# Patient Record
Sex: Female | Born: 1935 | Race: White | Hispanic: No | Smoking: Never smoker
Health system: Southern US, Community
[De-identification: ages and names within clinical notes are randomized; demographics above are authoritative.]

## PROBLEM LIST (undated history)

## (undated) DIAGNOSIS — C801 Malignant (primary) neoplasm, unspecified: Secondary | ICD-10-CM

## (undated) DIAGNOSIS — F0390 Unspecified dementia without behavioral disturbance: Secondary | ICD-10-CM

## (undated) DIAGNOSIS — E039 Hypothyroidism, unspecified: Secondary | ICD-10-CM

## (undated) DIAGNOSIS — I517 Cardiomegaly: Secondary | ICD-10-CM

## (undated) DIAGNOSIS — E079 Disorder of thyroid, unspecified: Secondary | ICD-10-CM

## (undated) DIAGNOSIS — I5033 Acute on chronic diastolic (congestive) heart failure: Secondary | ICD-10-CM

## (undated) DIAGNOSIS — S72009A Fracture of unspecified part of neck of unspecified femur, initial encounter for closed fracture: Secondary | ICD-10-CM

## (undated) DIAGNOSIS — G309 Alzheimer's disease, unspecified: Secondary | ICD-10-CM

## (undated) DIAGNOSIS — N184 Chronic kidney disease, stage 4 (severe): Secondary | ICD-10-CM

## (undated) DIAGNOSIS — F0280 Dementia in other diseases classified elsewhere without behavioral disturbance: Secondary | ICD-10-CM

## (undated) DIAGNOSIS — I10 Essential (primary) hypertension: Secondary | ICD-10-CM

## (undated) HISTORY — PX: MASTECTOMY: SHX3

---

## 2010-03-13 ENCOUNTER — Emergency Department (HOSPITAL_COMMUNITY): Admission: EM | Admit: 2010-03-13 | Discharge: 2010-03-13 | Payer: Self-pay | Admitting: Emergency Medicine

## 2010-05-25 ENCOUNTER — Emergency Department (HOSPITAL_COMMUNITY): Admission: EM | Admit: 2010-05-25 | Discharge: 2010-05-25 | Payer: Self-pay | Admitting: Emergency Medicine

## 2010-06-03 ENCOUNTER — Emergency Department (HOSPITAL_COMMUNITY)
Admission: EM | Admit: 2010-06-03 | Discharge: 2010-06-03 | Payer: Self-pay | Source: Home / Self Care | Admitting: Emergency Medicine

## 2010-06-27 ENCOUNTER — Emergency Department (HOSPITAL_COMMUNITY)
Admission: EM | Admit: 2010-06-27 | Discharge: 2010-06-28 | Payer: Self-pay | Source: Home / Self Care | Admitting: Emergency Medicine

## 2010-06-28 ENCOUNTER — Emergency Department (HOSPITAL_COMMUNITY)
Admission: EM | Admit: 2010-06-28 | Discharge: 2010-06-28 | Payer: Self-pay | Source: Home / Self Care | Admitting: Emergency Medicine

## 2010-07-21 HISTORY — PX: COLONOSCOPY: SHX174

## 2010-07-28 ENCOUNTER — Emergency Department (HOSPITAL_COMMUNITY): Admission: EM | Admit: 2010-07-28 | Discharge: 2010-06-19 | Payer: Self-pay | Admitting: Emergency Medicine

## 2010-07-30 ENCOUNTER — Inpatient Hospital Stay (HOSPITAL_COMMUNITY): Admission: EM | Admit: 2010-07-30 | Discharge: 2010-08-08 | Payer: Self-pay | Source: Home / Self Care

## 2010-08-01 ENCOUNTER — Encounter (INDEPENDENT_AMBULATORY_CARE_PROVIDER_SITE_OTHER): Payer: Self-pay

## 2010-08-08 ENCOUNTER — Ambulatory Visit: Payer: Self-pay | Admitting: Gastroenterology

## 2010-08-08 ENCOUNTER — Encounter (INDEPENDENT_AMBULATORY_CARE_PROVIDER_SITE_OTHER): Payer: Self-pay | Admitting: *Deleted

## 2010-09-22 NOTE — Miscellaneous (Signed)
Summary: CONSULTATION  Clinical Lists Changes  NAME:  Martha Pearson, Martha Pearson               ACCOUNT NO.:  [id number]      MEDICAL RECORD NO.:  [id number]          PATIENT TYPE:  INP      LOCATION:  A328                          FACILITY:  [facility]      PHYSICIAN:  R. Roetta Sessions, M.D. DATE OF BIRTH:  07/24/1918      DATE OF CONSULTATION:  07/31/2010   DATE OF DISCHARGE:                                    CONSULTATION         REASON FOR CONSULTATION:  Hematochezia in the setting of chronic   constipation.      HISTORY OF PRESENT ILLNESS:  Martha Pearson is a pleasant 77-year-   old lady, resident of the [place], admitted to [facility] last evening after presenting with rectal bleeding.   The patient has a history of chronic longstanding   constipation/obstipation, requiring frequent manual disimpactions.   Family members, who have been very attentive, noticed blood in her   Depends at the nursing home and yesterday brought her to the emergency   department for evaluation.  She was seen by Dr. __________ and   subsequently admitted to the hospital.  She has not had any melena.  She   has had absolutely no abdominal pain.  She remains hemodynamically   stable, although her hemoglobin has noted to trend down from 13.2 to 8.4   (this morning).      The patient history is derived from the patient's two cousins and   particularly Eli Phillips, who keeps a close check on [name].   Apparently, her oral intake has been good, although she requires   assistance with meals.  She has not had any apparent dysphagia, nausea,   vomiting and certainly no hematemesis.  Has not had any fever or chills.   Family members do not recall if this lady has never had a colonoscopy.   Apparently, she was set up to have one 2 years ago, but she had   orthopaedic issues which precluded that procedure being done.  She is on   a stool softener and MiraLAX at the nursing home, and it  is not known   how often she gets these agents, however.  No documented weight loss   recently.  No past medical history for having gastrointestinal issues,   as far as is known, aside chronic constipation.  Again, no prior   colonoscopy.  No family history of GI neoplasia in a first-degree   relatives.  She does have a history of hypothyroidism.      PAST MEDICAL HISTORY:  Significant for hypertension, hypothyroidism,   history of breast cancer status post right mastectomy, dementia,   multiple pelvic and hip fractures.      MEDICATIONS AT NURSING HOME:  Exelon, Xanax, Synthroid, acetaminophen,   Namenda, calcium carbonate, Colace, vitamin D3, alprazolam, aspirin,   polyethylene glycol, mirtazapine, sertraline, amlodipine.      ALLERGIES:   1. SULFA.   2. PENICILLIN.   3. CONTRAST MEDIA.  FAMILY HISTORY:  As outlined above.      SOCIAL HISTORY:  The patient is widowed.  She is from [place],   [place].  She has no children.  No history of tobacco or alcohol.   Because of multiple hip and pelvic fractures over the past few years,   she was admitted to a [facility] nursing home last fall, but was moved   over to [place] to be closer to family members involved in her   care.      REVIEW OF SYSTEMS:  As in history of present illness.      PHYSICAL EXAMINATION:  GENERAL: Reveals a frail, debilitated 77 year old   lady, who is conversant and appears to be comfortable.  Denies any pain   at this time.  She is in room 328, accompanied by two cousins.   VITAL SIGNS: Temperature 98, pulse 80, respiratory rate 16,  BP 160/88.   SKIN: Warm and dry, somewhat pale.   HEENT EXAM: She has pale conjunctivae.  No scleral icterus.   CHEST/LUNGS: Clear to auscultation.   BREAST EXAM: Deferred.   ABDOMEN: Full.  She appears to have a midline infraumbilical vertical   surgical scar present.  The abdomen is full.  Positive bowel sounds.  No   bruits.  Soft and nontender,  without appreciable mass or organomegaly.   EXTREMITIES: No edema.   RECTAL EXAM: No impaction appreciable.  Scant stool balls in the   proximal rectum palpated.  Gross maroon liquid stool on examiners   finger.      LABORATORY DATA:  White count 4.6, hemoglobin and hematocrit 8.4 and   25.1, down from baseline see above, MCV 85.4, platelet count 179,000.   Sodium 138, potassium 3.5, chloride 101, CO2 29, glucose 121, BUN 37,   creatinine 1.22.  From October 30, she had a CMP which demonstrate   completely normal LFTs.  Bilirubin 0.9, alkaline phosphatase 101, AST   and ALT 18 and 11, total protein 7.3, __________ 4.2.      IMPRESSION:  Martha Pearson is a 77 year old lady, nursing home   resident, who was admitted to the hospital last evening with rectal   bleeding in the setting of chronic constipation/obstipation, requiring   frequent manual stool removal.  She has had a notable significant drop   in her hemoglobin since admission.  However, she remains hemodynamically   stable.  Bleeding has occurred with essentially no pain whatsoever.      The etiology of the bleeding is not clear at this time.  I suspect it is   coming from her lower gastrointestinal tract, rather than an upper   gastrointestinal tract source at this time.  No prior colon imaging   apparently.      The possibility of an occult colorectal cancer certainly remains high   the differential, as does benign trauma from the rectum related to   chronic constipation and frequent manual disimpactions.  Diverticula   etiology not excluded at this time either.  I doubt ischemic colitis   with the paucity of abdominal pain.      Although her two cousins are intimately involved in her  care, they are   not her durable power attorney for healthcare.  Her brother, Carlis Abbott, who resides in [place] is 75 years old and is the durable   power of attorney.  His telephone number is [phone].  I have called    him, but have  not gotten an answer.  Family members at the bedside are   fully on board with proceeding with further evaluation, i.e. colonoscopy   in the near future.  I have not yet made any contact with the patient's   healthcare Power of [place].  This will certainly need to be done.  In   the interim, I agree with the Hospitalist's plan of management and have   discussed the case at length with Dr. __________.      Will go ahead and place on a clear liquid diet and give her some   additional GoLYTELY this afternoon, moving towards prep, shooting for a   diagnostic colonoscopy tomorrow, as long as an informed consent can be   obtained.  Will plan to give her two Fleet's enemas in the morning.  I   have discussed the risks, benefits, limitations and alternatives with   the family members present and will discuss further with the patient's   healthcare Power of [place].      I will take the liberty of putting her on a proton pump inhibitor   empirically for the time being, in the way of Protonix 40 mg IV q. 24 h.               Jonathon Bellows, M.D.               RMR/MEDQ  D:  07/31/2010  T:  07/31/2010  Job:  [id number]      cc:   [facility], [facility] 2 Team      Catalina Pizza, M.D.   Fax: [phone]      Electronically Signed by Lorrin Goodell M.D. on 08/07/2010 06:31:52 PM

## 2010-10-27 ENCOUNTER — Emergency Department (HOSPITAL_COMMUNITY)
Admission: EM | Admit: 2010-10-27 | Discharge: 2010-10-27 | Disposition: A | Discharge status: Home / Self Care | Payer: Medicare Other | Attending: Emergency Medicine | Admitting: Emergency Medicine

## 2010-10-27 ENCOUNTER — Emergency Department (HOSPITAL_COMMUNITY): Payer: Medicare Other

## 2010-10-27 DIAGNOSIS — I10 Essential (primary) hypertension: Secondary | ICD-10-CM | POA: Insufficient documentation

## 2010-10-27 DIAGNOSIS — Y9210 Unspecified residential institution as the place of occurrence of the external cause: Secondary | ICD-10-CM | POA: Insufficient documentation

## 2010-10-27 DIAGNOSIS — F0390 Unspecified dementia without behavioral disturbance: Secondary | ICD-10-CM | POA: Insufficient documentation

## 2010-10-27 DIAGNOSIS — Z79899 Other long term (current) drug therapy: Secondary | ICD-10-CM | POA: Insufficient documentation

## 2010-10-27 DIAGNOSIS — S0003XA Contusion of scalp, initial encounter: Secondary | ICD-10-CM | POA: Insufficient documentation

## 2010-10-27 DIAGNOSIS — E039 Hypothyroidism, unspecified: Secondary | ICD-10-CM | POA: Insufficient documentation

## 2010-10-27 DIAGNOSIS — S0990XA Unspecified injury of head, initial encounter: Secondary | ICD-10-CM | POA: Insufficient documentation

## 2010-10-27 DIAGNOSIS — M810 Age-related osteoporosis without current pathological fracture: Secondary | ICD-10-CM | POA: Insufficient documentation

## 2010-10-27 DIAGNOSIS — Z853 Personal history of malignant neoplasm of breast: Secondary | ICD-10-CM | POA: Insufficient documentation

## 2010-10-27 DIAGNOSIS — M129 Arthropathy, unspecified: Secondary | ICD-10-CM | POA: Insufficient documentation

## 2010-10-27 DIAGNOSIS — S7000XA Contusion of unspecified hip, initial encounter: Secondary | ICD-10-CM | POA: Insufficient documentation

## 2010-10-27 DIAGNOSIS — W050XXA Fall from non-moving wheelchair, initial encounter: Secondary | ICD-10-CM | POA: Insufficient documentation

## 2010-10-31 LAB — BASIC METABOLIC PANEL
BUN: 18 mg/dL (ref 6–23)
BUN: 20 mg/dL (ref 6–23)
BUN: 21 mg/dL (ref 6–23)
BUN: 25 mg/dL — ABNORMAL HIGH (ref 6–23)
BUN: 27 mg/dL — ABNORMAL HIGH (ref 6–23)
CO2: 29 mEq/L (ref 19–32)
CO2: 29 mEq/L (ref 19–32)
CO2: 30 mEq/L (ref 19–32)
CO2: 31 mEq/L (ref 19–32)
Calcium: 8.3 mg/dL — ABNORMAL LOW (ref 8.4–10.5)
Calcium: 8.6 mg/dL (ref 8.4–10.5)
Calcium: 8.7 mg/dL (ref 8.4–10.5)
Calcium: 8.7 mg/dL (ref 8.4–10.5)
Chloride: 102 mEq/L (ref 96–112)
Chloride: 105 mEq/L (ref 96–112)
Chloride: 99 mEq/L (ref 96–112)
Creatinine, Ser: 1.66 mg/dL — ABNORMAL HIGH (ref 0.4–1.2)
Creatinine, Ser: 1.69 mg/dL — ABNORMAL HIGH (ref 0.4–1.2)
Creatinine, Ser: 1.7 mg/dL — ABNORMAL HIGH (ref 0.4–1.2)
Creatinine, Ser: 1.76 mg/dL — ABNORMAL HIGH (ref 0.4–1.2)
Creatinine, Ser: 1.77 mg/dL — ABNORMAL HIGH (ref 0.4–1.2)
GFR calc Af Amer: 33 mL/min — ABNORMAL LOW (ref >60)
GFR calc Af Amer: 34 mL/min — ABNORMAL LOW (ref >60)
GFR calc non Af Amer: 27 mL/min — ABNORMAL LOW (ref >60)
GFR calc non Af Amer: 28 mL/min — ABNORMAL LOW (ref >60)
GFR calc non Af Amer: 29 mL/min — ABNORMAL LOW (ref >60)
GFR calc non Af Amer: 30 mL/min — ABNORMAL LOW (ref >60)
Glucose, Bld: 107 mg/dL — ABNORMAL HIGH (ref 70–99)
Glucose, Bld: 109 mg/dL — ABNORMAL HIGH (ref 70–99)
Glucose, Bld: 97 mg/dL (ref 70–99)
Glucose, Bld: 98 mg/dL (ref 70–99)
Glucose, Bld: 99 mg/dL (ref 70–99)
Potassium: 3.3 mEq/L — ABNORMAL LOW (ref 3.5–5.1)
Potassium: 3.4 mEq/L — ABNORMAL LOW (ref 3.5–5.1)
Potassium: 4 mEq/L (ref 3.5–5.1)
Sodium: 139 mEq/L (ref 135–145)
Sodium: 143 mEq/L (ref 135–145)

## 2010-10-31 LAB — CBC
HCT: 35 % — ABNORMAL LOW (ref 36.0–46.0)
Hemoglobin: 11.9 g/dL — ABNORMAL LOW (ref 12.0–15.0)
MCH: 29.3 pg (ref 26.0–34.0)
MCH: 29.6 pg (ref 26.0–34.0)
MCHC: 33.3 g/dL (ref 30.0–36.0)
MCHC: 33.9 g/dL (ref 30.0–36.0)
MCV: 86.4 fL (ref 78.0–100.0)
MCV: 87.2 fL (ref 78.0–100.0)
Platelets: 159 10*3/uL (ref 150–400)
Platelets: 170 10*3/uL (ref 150–400)
RBC: 3.69 MIL/uL — ABNORMAL LOW (ref 3.87–5.11)
RBC: 3.75 MIL/uL — ABNORMAL LOW (ref 3.87–5.11)
WBC: 8.1 10*3/uL (ref 4.0–10.5)

## 2010-10-31 LAB — DIFFERENTIAL
Basophils Relative: 0 % (ref 0–1)
Basophils Relative: 0 % (ref 0–1)
Eosinophils Absolute: 0.2 10*3/uL (ref 0.0–0.7)
Eosinophils Absolute: 0.2 10*3/uL (ref 0.0–0.7)
Eosinophils Relative: 2 % (ref 0–5)
Eosinophils Relative: 4 % (ref 0–5)
Eosinophils Relative: 5 % (ref 0–5)
Lymphocytes Relative: 9 % — ABNORMAL LOW (ref 12–46)
Lymphs Abs: 0.8 10*3/uL (ref 0.7–4.0)
Lymphs Abs: 0.9 10*3/uL (ref 0.7–4.0)
Lymphs Abs: 0.9 10*3/uL (ref 0.7–4.0)
Monocytes Absolute: 0.6 10*3/uL (ref 0.1–1.0)
Monocytes Relative: 8 % (ref 3–12)
Monocytes Relative: 9 % (ref 3–12)
Neutro Abs: 6.5 10*3/uL (ref 1.7–7.7)
Neutrophils Relative %: 70 % (ref 43–77)

## 2010-11-01 LAB — DIFFERENTIAL
Basophils Absolute: 0 10*3/uL (ref 0.0–0.1)
Basophils Absolute: 0 10*3/uL (ref 0.0–0.1)
Basophils Absolute: 0 10*3/uL (ref 0.0–0.1)
Basophils Absolute: 0 10*3/uL (ref 0.0–0.1)
Basophils Absolute: 0 10*3/uL (ref 0.0–0.1)
Basophils Relative: 0 % (ref 0–1)
Basophils Relative: 0 % (ref 0–1)
Basophils Relative: 0 % (ref 0–1)
Basophils Relative: 0 % (ref 0–1)
Eosinophils Absolute: 0.1 10*3/uL (ref 0.0–0.7)
Eosinophils Absolute: 0.1 10*3/uL (ref 0.0–0.7)
Eosinophils Absolute: 0.1 10*3/uL (ref 0.0–0.7)
Eosinophils Absolute: 0.1 10*3/uL (ref 0.0–0.7)
Eosinophils Relative: 1 % (ref 0–5)
Eosinophils Relative: 1 % (ref 0–5)
Eosinophils Relative: 1 % (ref 0–5)
Eosinophils Relative: 2 % (ref 0–5)
Eosinophils Relative: 2 % (ref 0–5)
Lymphocytes Relative: 14 % (ref 12–46)
Lymphocytes Relative: 15 % (ref 12–46)
Lymphocytes Relative: 19 % (ref 12–46)
Lymphocytes Relative: 19 % (ref 12–46)
Lymphocytes Relative: 26 % (ref 12–46)
Lymphs Abs: 0.9 10*3/uL (ref 0.7–4.0)
Lymphs Abs: 0.9 10*3/uL (ref 0.7–4.0)
Lymphs Abs: 1.1 10*3/uL (ref 0.7–4.0)
Lymphs Abs: 1.1 10*3/uL (ref 0.7–4.0)
Lymphs Abs: 1.2 10*3/uL (ref 0.7–4.0)
Monocytes Absolute: 0.4 10*3/uL (ref 0.1–1.0)
Monocytes Absolute: 0.4 10*3/uL (ref 0.1–1.0)
Monocytes Absolute: 0.5 10*3/uL (ref 0.1–1.0)
Monocytes Absolute: 0.5 10*3/uL (ref 0.1–1.0)
Monocytes Relative: 7 % (ref 3–12)
Monocytes Relative: 8 % (ref 3–12)
Monocytes Relative: 9 % (ref 3–12)
Monocytes Relative: 9 % (ref 3–12)
Neutro Abs: 3 10*3/uL (ref 1.7–7.7)
Neutro Abs: 3.4 10*3/uL (ref 1.7–7.7)
Neutro Abs: 4.1 10*3/uL (ref 1.7–7.7)
Neutro Abs: 5 10*3/uL (ref 1.7–7.7)
Neutrophils Relative %: 64 % (ref 43–77)
Neutrophils Relative %: 70 % (ref 43–77)
Neutrophils Relative %: 71 % (ref 43–77)
Neutrophils Relative %: 76 % (ref 43–77)
Neutrophils Relative %: 77 % (ref 43–77)

## 2010-11-01 LAB — BASIC METABOLIC PANEL
BUN: 19 mg/dL (ref 6–23)
BUN: 37 mg/dL — ABNORMAL HIGH (ref 6–23)
BUN: 43 mg/dL — ABNORMAL HIGH (ref 6–23)
CO2: 29 mEq/L (ref 19–32)
CO2: 31 mEq/L (ref 19–32)
Calcium: 8.5 mg/dL (ref 8.4–10.5)
Calcium: 8.5 mg/dL (ref 8.4–10.5)
Calcium: 9.7 mg/dL (ref 8.4–10.5)
Chloride: 101 mEq/L (ref 96–112)
Chloride: 102 mEq/L (ref 96–112)
Creatinine, Ser: 1.04 mg/dL (ref 0.4–1.2)
Creatinine, Ser: 1.22 mg/dL — ABNORMAL HIGH (ref 0.4–1.2)
Creatinine, Ser: 1.48 mg/dL — ABNORMAL HIGH (ref 0.4–1.2)
GFR calc Af Amer: 40 mL/min — ABNORMAL LOW (ref >60)
GFR calc Af Amer: 50 mL/min — ABNORMAL LOW (ref >60)
GFR calc Af Amer: >60 mL/min (ref >60)
GFR calc non Af Amer: 33 mL/min — ABNORMAL LOW (ref >60)
GFR calc non Af Amer: 41 mL/min — ABNORMAL LOW (ref >60)
GFR calc non Af Amer: 50 mL/min — ABNORMAL LOW (ref >60)
Glucose, Bld: 121 mg/dL — ABNORMAL HIGH (ref 70–99)
Glucose, Bld: 81 mg/dL (ref 70–99)
Potassium: 2.9 mEq/L — ABNORMAL LOW (ref 3.5–5.1)
Potassium: 3.5 mEq/L (ref 3.5–5.1)
Sodium: 138 mEq/L (ref 135–145)
Sodium: 143 mEq/L (ref 135–145)

## 2010-11-01 LAB — CBC
HCT: 23.4 % — ABNORMAL LOW (ref 36.0–46.0)
HCT: 25.1 % — ABNORMAL LOW (ref 36.0–46.0)
HCT: 35.4 % — ABNORMAL LOW (ref 36.0–46.0)
HCT: 36.6 % (ref 36.0–46.0)
Hemoglobin: 12.1 g/dL (ref 12.0–15.0)
Hemoglobin: 12.7 g/dL (ref 12.0–15.0)
Hemoglobin: 7.9 g/dL — ABNORMAL LOW (ref 12.0–15.0)
Hemoglobin: 8.4 g/dL — ABNORMAL LOW (ref 12.0–15.0)
MCH: 28.6 pg (ref 26.0–34.0)
MCH: 28.7 pg (ref 26.0–34.0)
MCH: 28.7 pg (ref 26.0–34.0)
MCH: 28.9 pg (ref 26.0–34.0)
MCHC: 33.5 g/dL (ref 30.0–36.0)
MCHC: 33.8 g/dL (ref 30.0–36.0)
MCHC: 34.2 g/dL (ref 30.0–36.0)
MCHC: 34.7 g/dL (ref 30.0–36.0)
MCV: 83.2 fL (ref 78.0–100.0)
MCV: 84.1 fL (ref 78.0–100.0)
MCV: 84.3 fL (ref 78.0–100.0)
MCV: 85.1 fL (ref 78.0–100.0)
MCV: 85.4 fL (ref 78.0–100.0)
Platelets: 146 10*3/uL — ABNORMAL LOW (ref 150–400)
Platelets: 151 10*3/uL (ref 150–400)
Platelets: 173 10*3/uL (ref 150–400)
Platelets: 179 10*3/uL (ref 150–400)
Platelets: 205 10*3/uL (ref 150–400)
RBC: 2.75 MIL/uL — ABNORMAL LOW (ref 3.87–5.11)
RBC: 2.94 MIL/uL — ABNORMAL LOW (ref 3.87–5.11)
RBC: 3.82 MIL/uL — ABNORMAL LOW (ref 3.87–5.11)
RBC: 4.21 MIL/uL (ref 3.87–5.11)
RBC: 4.4 MIL/uL (ref 3.87–5.11)
RDW: 13.3 % (ref 11.5–15.5)
RDW: 13.4 % (ref 11.5–15.5)
RDW: 13.5 % (ref 11.5–15.5)
RDW: 13.5 % (ref 11.5–15.5)
RDW: 13.5 % (ref 11.5–15.5)
WBC: 4.6 10*3/uL (ref 4.0–10.5)
WBC: 4.8 10*3/uL (ref 4.0–10.5)
WBC: 5.8 10*3/uL (ref 4.0–10.5)
WBC: 6.5 10*3/uL (ref 4.0–10.5)
WBC: 7.2 10*3/uL (ref 4.0–10.5)

## 2010-11-01 LAB — TYPE AND SCREEN
ABO/RH(D): A POS
Antibody Screen: NEGATIVE
Unit division: 0
Unit division: 0
Unit division: 0
Unit division: 0

## 2010-11-01 LAB — MRSA PCR SCREENING: MRSA by PCR: NEGATIVE

## 2010-11-01 LAB — CULTURE, BLOOD (ROUTINE X 2)
Culture: NO GROWTH
Culture: NO GROWTH

## 2010-11-01 LAB — HEMOGLOBIN AND HEMATOCRIT, BLOOD
HCT: 33.3 % — ABNORMAL LOW (ref 36.0–46.0)
Hemoglobin: 11.6 g/dL — ABNORMAL LOW (ref 12.0–15.0)

## 2010-11-02 LAB — CBC
HCT: 39.3 % (ref 36.0–46.0)
Hemoglobin: 13.2 g/dL (ref 12.0–15.0)
MCHC: 33.5 g/dL (ref 30.0–36.0)
MCV: 86.5 fL (ref 78.0–100.0)
RBC: 4.55 MIL/uL (ref 3.87–5.11)
RDW: 14.3 % (ref 11.5–15.5)
WBC: 5.4 10*3/uL (ref 4.0–10.5)

## 2010-11-02 LAB — URINALYSIS, ROUTINE W REFLEX MICROSCOPIC
Leukocytes, UA: NEGATIVE
Specific Gravity, Urine: 1.015 (ref 1.005–1.030)
Urobilinogen, UA: 0.2 mg/dL (ref 0.0–1.0)

## 2010-11-02 LAB — COMPREHENSIVE METABOLIC PANEL
Alkaline Phosphatase: 101 U/L (ref 39–117)
BUN: 27 mg/dL — ABNORMAL HIGH (ref 6–23)
CO2: 31 mEq/L (ref 19–32)
Calcium: 10.6 mg/dL — ABNORMAL HIGH (ref 8.4–10.5)
GFR calc non Af Amer: 42 mL/min — ABNORMAL LOW (ref >60)
Glucose, Bld: 109 mg/dL — ABNORMAL HIGH (ref 70–99)
Potassium: 3.6 mEq/L (ref 3.5–5.1)
Total Protein: 7.3 g/dL (ref 6.0–8.3)

## 2010-11-02 LAB — URINE CULTURE: Culture: NO GROWTH

## 2010-11-02 LAB — DIFFERENTIAL
Basophils Relative: 0 % (ref 0–1)
Monocytes Relative: 7 % (ref 3–12)
Neutro Abs: 3.9 10*3/uL (ref 1.7–7.7)
Neutrophils Relative %: 73 % (ref 43–77)

## 2010-11-02 LAB — URINE MICROSCOPIC-ADD ON

## 2011-03-05 ENCOUNTER — Emergency Department (HOSPITAL_COMMUNITY): Payer: Medicare Other

## 2011-03-05 ENCOUNTER — Encounter: Payer: Self-pay | Admitting: *Deleted

## 2011-03-05 ENCOUNTER — Emergency Department (HOSPITAL_COMMUNITY)
Admission: EM | Admit: 2011-03-05 | Discharge: 2011-03-06 | Disposition: A | Discharge status: Nursing Home (Includes ICF) | Payer: Medicare Other | Attending: Emergency Medicine | Admitting: Emergency Medicine

## 2011-03-05 DIAGNOSIS — R112 Nausea with vomiting, unspecified: Secondary | ICD-10-CM | POA: Insufficient documentation

## 2011-03-05 DIAGNOSIS — N289 Disorder of kidney and ureter, unspecified: Secondary | ICD-10-CM

## 2011-03-05 DIAGNOSIS — I10 Essential (primary) hypertension: Secondary | ICD-10-CM | POA: Insufficient documentation

## 2011-03-05 DIAGNOSIS — E039 Hypothyroidism, unspecified: Secondary | ICD-10-CM | POA: Insufficient documentation

## 2011-03-05 DIAGNOSIS — Z79899 Other long term (current) drug therapy: Secondary | ICD-10-CM | POA: Insufficient documentation

## 2011-03-05 DIAGNOSIS — R109 Unspecified abdominal pain: Secondary | ICD-10-CM | POA: Insufficient documentation

## 2011-03-05 HISTORY — DX: Disorder of thyroid, unspecified: E07.9

## 2011-03-05 HISTORY — DX: Essential (primary) hypertension: I10

## 2011-03-05 LAB — URINALYSIS, ROUTINE W REFLEX MICROSCOPIC
Bilirubin Urine: NEGATIVE
Glucose, UA: NEGATIVE mg/dL
Specific Gravity, Urine: 1.025 (ref 1.005–1.030)
Urobilinogen, UA: 0.2 mg/dL (ref 0.0–1.0)
pH: 7.5 (ref 5.0–8.0)

## 2011-03-05 LAB — BASIC METABOLIC PANEL
BUN: 47 mg/dL — ABNORMAL HIGH (ref 6–23)
CO2: 32 mEq/L (ref 19–32)
Chloride: 98 mEq/L (ref 96–112)
Glucose, Bld: 171 mg/dL — ABNORMAL HIGH (ref 70–99)
Potassium: 3.9 mEq/L (ref 3.5–5.1)
Sodium: 139 mEq/L (ref 135–145)

## 2011-03-05 LAB — DIFFERENTIAL
Eosinophils Absolute: 0.1 10*3/uL (ref 0.0–0.7)
Lymphocytes Relative: 12 % (ref 12–46)
Lymphs Abs: 0.6 10*3/uL — ABNORMAL LOW (ref 0.7–4.0)
Monocytes Relative: 3 % (ref 3–12)
Neutro Abs: 4.3 10*3/uL (ref 1.7–7.7)
Neutrophils Relative %: 83 % — ABNORMAL HIGH (ref 43–77)

## 2011-03-05 LAB — CBC
Hemoglobin: 12 g/dL (ref 12.0–15.0)
MCH: 28.7 pg (ref 26.0–34.0)
Platelets: 216 10*3/uL (ref 150–400)
RBC: 4.18 MIL/uL (ref 3.87–5.11)
WBC: 5.2 10*3/uL (ref 4.0–10.5)

## 2011-03-05 LAB — URINE MICROSCOPIC-ADD ON

## 2011-03-05 MED ORDER — SODIUM CHLORIDE 0.9 % IJ SOLN
INTRAMUSCULAR | Status: AC
  Filled 2011-03-05: qty 10

## 2011-03-05 MED ORDER — PANTOPRAZOLE SODIUM 40 MG IV SOLR
40.0000 mg | Freq: Once | INTRAVENOUS | Status: AC
  Administered 2011-03-05: 40 mg via INTRAVENOUS
  Filled 2011-03-05: qty 40

## 2011-03-05 MED ORDER — ONDANSETRON HCL 4 MG/2ML IJ SOLN
4.0000 mg | Freq: Once | INTRAMUSCULAR | Status: AC
  Administered 2011-03-05: 4 mg via INTRAVENOUS
  Filled 2011-03-05: qty 2

## 2011-03-05 MED ORDER — SODIUM CHLORIDE 0.45 % IV BOLUS
250.0000 mL | Freq: Once | INTRAVENOUS | Status: AC
  Administered 2011-03-05 (×2): 250 mL via INTRAVENOUS

## 2011-03-05 MED ORDER — SODIUM CHLORIDE 0.9 % IV SOLN
Freq: Once | INTRAVENOUS | Status: AC
  Administered 2011-03-05: 23:00:00 via INTRAVENOUS

## 2011-03-05 NOTE — ED Notes (Signed)
Pt from [facility]. Reported hypertensive from ems & vomiting.

## 2011-03-05 NOTE — ED Notes (Signed)
Care assumed for pt, stated she vomited x3 today and nobody cares.  "just sigh the death certificate"

## 2011-03-05 NOTE — ED Notes (Signed)
Pt transported from [facility]. Reported pt having nausea & vomiting.

## 2011-03-05 NOTE — ED Notes (Signed)
Pt moved to exam 14 from 15, friend at bedside.

## 2011-03-05 NOTE — ED Provider Notes (Signed)
History     Chief Complaint  Patient presents with  . Nausea  . Emesis   Patient is a 77 y.o. female presenting with vomiting. The history is provided by the patient and a relative (nursing home; patient is on the [facility] unit).  Emesis  This is a new problem. The current episode started 1 to 2 hours ago. The emesis has an appearance of stomach contents. There has been no fever. Associated symptoms include abdominal pain. Pertinent negatives include no chills, no fever and no headaches.    Past Medical History  Diagnosis Date  . Hypertension   . Thyroid disease     hypothyroid    History reviewed. No pertinent past surgical history.  No family history on file.  History  Substance Use Topics  . Smoking status: Not on file  . Smokeless tobacco: Not on file  . Alcohol Use: No    OB History    Grav Para Term Preterm Abortions TAB SAB Ect Mult Living                  Review of Systems  Constitutional: Negative for fever and chills.  Gastrointestinal: Positive for vomiting and abdominal pain.  Neurological: Negative for headaches.  All other systems reviewed and are negative.    Physical Exam  BP 196/104  Pulse 75  Temp(Src) 97.7 F (36.5 C) (Oral)  Resp 20  Ht 5\' 6"  (1.676 m)  Wt 100 lb (45.36 kg)  BMI 16.14 kg/m2  SpO2 97%  Physical Exam  Constitutional: She is oriented to person, place, and time. She appears well-developed and well-nourished. She appears distressed.  HENT:  Head: Normocephalic and atraumatic.  Eyes: Conjunctivae and EOM are normal. Pupils are equal, round, and reactive to light.  Neck: Normal range of motion. Neck supple. No JVD present.  Cardiovascular: Normal rate, normal heart sounds and intact distal pulses.   Pulmonary/Chest: Effort normal and breath sounds normal.  Abdominal: Soft. Bowel sounds are normal.  Musculoskeletal: Normal range of motion.  Neurological: She is alert and oriented to person, place, and time.  Skin: Skin  is warm and dry.    ED Course  Procedures  MDM       Nicoletta Dress. Colon Branch, MD 03/05/11 2232

## 2011-03-06 MED ORDER — ONDANSETRON HCL 4 MG PO TABS: 4.0000 mg | ORAL_TABLET | Freq: Four times a day (QID) | ORAL | Status: AC

## 2011-03-06 MED ORDER — ONDANSETRON HCL 4 MG PO TABS: 4.0000 mg | ORAL_TABLET | Freq: Three times a day (TID) | ORAL | Status: AC | PRN

## 2011-03-06 MED ORDER — ONDANSETRON 8 MG PO TBDP
ORAL_TABLET | ORAL | Status: AC
  Administered 2011-03-06: 03:00:00
  Filled 2011-03-06: qty 1

## 2011-03-06 NOTE — ED Notes (Signed)
Pt resting in bed, resp even no furhter n/v, riend at bedside.

## 2011-03-06 NOTE — ED Notes (Signed)
Called out to car by pt/friend, pt again having nausea and vomited small amount.  Order obtained for odt zofran and given, pt tolerated well, left in car.  Updated report called to rachel at [facility]

## 2011-03-06 NOTE — Progress Notes (Signed)
Reviewed results with daughter. Patient has had IVF and PO fluids. No further vomiting.

## 2011-04-19 ENCOUNTER — Emergency Department (HOSPITAL_COMMUNITY): Payer: Medicare Other

## 2011-04-19 ENCOUNTER — Encounter (HOSPITAL_COMMUNITY): Payer: Self-pay | Admitting: Emergency Medicine

## 2011-04-19 ENCOUNTER — Emergency Department (HOSPITAL_COMMUNITY)
Admission: EM | Admit: 2011-04-19 | Discharge: 2011-04-20 | Disposition: A | Discharge status: Skilled Nursing Facility | Payer: Medicare Other | Attending: Emergency Medicine | Admitting: Emergency Medicine

## 2011-04-19 DIAGNOSIS — R5381 Other malaise: Secondary | ICD-10-CM | POA: Insufficient documentation

## 2011-04-19 DIAGNOSIS — R112 Nausea with vomiting, unspecified: Secondary | ICD-10-CM | POA: Insufficient documentation

## 2011-04-19 DIAGNOSIS — R5383 Other fatigue: Secondary | ICD-10-CM | POA: Insufficient documentation

## 2011-04-19 DIAGNOSIS — N390 Urinary tract infection, site not specified: Secondary | ICD-10-CM | POA: Insufficient documentation

## 2011-04-19 DIAGNOSIS — N289 Disorder of kidney and ureter, unspecified: Secondary | ICD-10-CM

## 2011-04-19 DIAGNOSIS — Z79899 Other long term (current) drug therapy: Secondary | ICD-10-CM | POA: Insufficient documentation

## 2011-04-19 LAB — CBC
Hemoglobin: 12.5 g/dL (ref 12.0–15.0)
MCH: 28.4 pg (ref 26.0–34.0)
Platelets: 192 10*3/uL (ref 150–400)
RBC: 4.4 MIL/uL (ref 3.87–5.11)

## 2011-04-19 LAB — URINALYSIS, ROUTINE W REFLEX MICROSCOPIC
Bilirubin Urine: NEGATIVE
Glucose, UA: NEGATIVE mg/dL
Ketones, ur: NEGATIVE mg/dL
Leukocytes, UA: NEGATIVE
pH: 7.5 (ref 5.0–8.0)

## 2011-04-19 LAB — COMPREHENSIVE METABOLIC PANEL
AST: 17 U/L (ref 0–37)
CO2: 29 mEq/L (ref 19–32)
Chloride: 98 mEq/L (ref 96–112)
Creatinine, Ser: 1.69 mg/dL — ABNORMAL HIGH (ref 0.50–1.10)
GFR calc non Af Amer: 28 mL/min — ABNORMAL LOW (ref >60)
Glucose, Bld: 167 mg/dL — ABNORMAL HIGH (ref 70–99)
Total Bilirubin: 0.4 mg/dL (ref 0.3–1.2)

## 2011-04-19 LAB — URINE MICROSCOPIC-ADD ON

## 2011-04-19 MED ORDER — PANTOPRAZOLE SODIUM 40 MG IV SOLR
40.0000 mg | Freq: Once | INTRAVENOUS | Status: AC
  Administered 2011-04-20: 40 mg via INTRAVENOUS
  Filled 2011-04-19: qty 40

## 2011-04-19 MED ORDER — ONDANSETRON HCL 4 MG/2ML IJ SOLN
4.0000 mg | Freq: Once | INTRAMUSCULAR | Status: AC
  Administered 2011-04-19: 4 mg via INTRAVENOUS
  Filled 2011-04-19: qty 2

## 2011-04-19 NOTE — ED Notes (Signed)
N/v since 6pm. Denies pain or diarrhea. EMS administered zofran 4mg  iv in route

## 2011-04-19 NOTE — ED Notes (Signed)
In & Out cath clear and a little cloudy. Performed by Stephens Shire NT assisted by Mearl Latin NT.

## 2011-04-20 MED ORDER — NITROFURANTOIN MACROCRYSTAL 100 MG PO CAPS
100.0000 mg | ORAL_CAPSULE | Freq: Once | ORAL | Status: AC
  Administered 2011-04-20: 100 mg via ORAL
  Filled 2011-04-20: qty 1

## 2011-04-20 MED ORDER — NITROFURANTOIN MACROCRYSTAL 100 MG PO CAPS: 100.0000 mg | ORAL_CAPSULE | Freq: Two times a day (BID) | ORAL | Status: AC

## 2011-04-20 MED ORDER — ONDANSETRON 4 MG PO TBDP: 4.0000 mg | ORAL_TABLET | Freq: Three times a day (TID) | ORAL | Status: AC | PRN

## 2011-04-20 NOTE — ED Notes (Signed)
Pt taken to ct 

## 2011-04-20 NOTE — ED Notes (Signed)
Report given to theresa at [facility]. Pt taken back by family member

## 2011-04-20 NOTE — ED Notes (Signed)
Pt sleeping. 

## 2011-04-20 NOTE — ED Notes (Signed)
Pt was asleep. Pt woke up to take meds and went back to sleep.

## 2011-04-22 LAB — URINE CULTURE: Culture  Setup Time: 201208301754

## 2011-04-23 NOTE — ED Notes (Signed)
+   urine culture. Treated with Macrodantin, sensitive to same per protocol MD. 

## 2011-05-27 ENCOUNTER — Emergency Department (HOSPITAL_COMMUNITY): Payer: Medicare Other

## 2011-05-27 ENCOUNTER — Encounter (HOSPITAL_COMMUNITY): Payer: Self-pay

## 2011-05-27 ENCOUNTER — Emergency Department (HOSPITAL_COMMUNITY)
Admission: EM | Admit: 2011-05-27 | Discharge: 2011-05-27 | Disposition: A | Discharge status: Home / Self Care | Payer: Medicare Other | Attending: Emergency Medicine | Admitting: Emergency Medicine

## 2011-05-27 DIAGNOSIS — W1809XA Striking against other object with subsequent fall, initial encounter: Secondary | ICD-10-CM | POA: Insufficient documentation

## 2011-05-27 DIAGNOSIS — S0990XA Unspecified injury of head, initial encounter: Secondary | ICD-10-CM | POA: Insufficient documentation

## 2011-05-27 DIAGNOSIS — R51 Headache: Secondary | ICD-10-CM | POA: Insufficient documentation

## 2011-05-27 DIAGNOSIS — F0390 Unspecified dementia without behavioral disturbance: Secondary | ICD-10-CM | POA: Insufficient documentation

## 2011-05-27 DIAGNOSIS — Z79899 Other long term (current) drug therapy: Secondary | ICD-10-CM | POA: Insufficient documentation

## 2011-05-27 HISTORY — DX: Malignant (primary) neoplasm, unspecified: C80.1

## 2011-05-27 HISTORY — DX: Dementia in other diseases classified elsewhere, unspecified severity, without behavioral disturbance, psychotic disturbance, mood disturbance, and anxiety: F02.80

## 2011-05-27 HISTORY — DX: Hypothyroidism, unspecified: E03.9

## 2011-05-27 HISTORY — DX: Alzheimer's disease, unspecified: G30.9

## 2011-05-27 NOTE — ED Provider Notes (Signed)
Scribed for Benny Lennert, MD, the patient was seen in room APA04 . This chart was scribed by Ellie Lunch. This patient's care was started at 3:45 PM.   CSN: [id number] Arrival date & time: 05/27/2011  3:39 PM  Chief Complaint  Patient presents with  . Fall    (Consider location/radiation/quality/duration/timing/severity/associated sxs/prior treatment) HPI Level 5 Caveat for Dementia.  Martha Pearson is a 77 y.o. female brought in by ambulance, who presents to the Emergency Department complaining of a fall. Pt reports falling less than an hour ago at [facility] and hit head on night stand. Denies any LOC. EMS says fall was unwitnessed. Pt c/o pain to back of head.  PCP Dr. Margo Aye  Past Medical History  Diagnosis Date  . Hypertension   . Thyroid disease     hypothyroid  . Cancer     breast  . Hypothyroid   . Alzheimer disease   . Osteoporosis     Past Surgical History  Procedure Date  . Mastectomy     right    No family history on file.  History  Substance Use Topics  . Smoking status: Not on file  . Smokeless tobacco: Not on file  . Alcohol Use: No   Review of Systems  Unable to perform ROS: Dementia    Allergies  Ace inhibitors; Penicillins; and Sulfa antibiotics  Home Medications   Current Outpatient Rx  Name Route Sig Dispense Refill  . ALPRAZOLAM 0.25 MG PO TABS Oral Take 0.125 mg by mouth every 8 (eight) hours.     . AMLODIPINE BESYLATE 10 MG PO TABS Oral Take 10 mg by mouth daily.      . ASPIRIN 81 MG PO TBEC Oral Take 81 mg by mouth daily.      Marland Kitchen CALCIUM CARBONATE 600 MG PO TABS Oral Take 600 mg by mouth 3 (three) times daily.      Marland Kitchen DOCUSATE SODIUM 100 MG PO CAPS Oral Take 100 mg by mouth 2 (two) times daily.      Marland Kitchen LABETALOL HCL 200 MG PO TABS Oral Take 200 mg by mouth 3 (three) times daily.      Marland Kitchen LACTULOSE 10 GM/15ML PO SOLN Oral Take 20 g by mouth daily.      Marland Kitchen LEVOTHYROXINE SODIUM 100 MCG PO TABS Oral Take 100 mcg by mouth daily.      Marland Kitchen  MEMANTINE HCL 10 MG PO TABS Oral Take 10 mg by mouth 2 (two) times daily.      Marland Kitchen MIRTAZAPINE 30 MG PO TABS Oral Take 30 mg by mouth at bedtime.      . ENSURE PO Oral Take by mouth 2 (two) times daily.      Marland Kitchen POLYETHYLENE GLYCOL 3350 PO PACK Oral Take 17 g by mouth daily.      Marland Kitchen PROMETHAZINE HCL 25 MG PO TABS Oral Take 25 mg by mouth every 6 (six) hours as needed.      . PSYLLIUM 58.6 % PO POWD Oral Take 1 packet by mouth daily.      Marland Kitchen RIVASTIGMINE 4.6 MG/24HR TD PT24 Transdermal Place 1 patch onto the skin daily.      . SENOKOT PO Oral Take 2 tablets by mouth at bedtime.      . SERTRALINE HCL 25 MG PO TABS Oral Take 25 mg by mouth daily.      Marland Kitchen VITAMIN D (CHOLECALCIFEROL) PO Oral Take 1,000 mg by mouth 2 (two) times daily.  BP 179/75  Pulse 66  Temp(Src) 97.9 F (36.6 C) (Oral)  Resp 20  SpO2 96%  Physical Exam  Nursing note and vitals reviewed. Constitutional: She appears well-developed and well-nourished. No distress.  HENT:  Head: Normocephalic and atraumatic.       Slight swelling to back of head.   Eyes: EOM are normal.  Neck: Normal range of motion.  Cardiovascular: Normal rate, regular rhythm and normal heart sounds.   Pulmonary/Chest: Effort normal and breath sounds normal.  Abdominal: Soft. There is no tenderness.  Musculoskeletal: Normal range of motion.  Neurological: She is alert. She has normal strength. Coordination normal.       Oriented to person and place  Skin: Skin is warm and dry.  Psychiatric: She has a normal mood and affect.   Procedures (including critical care time)  OTHER DATA REVIEWED: Nursing notes, vital signs, and past medical records reviewed.  DIAGNOSTIC STUDIES: Oxygen Saturation is 96% on room air, normal by my interpretation.    LABS / RADIOLOGY:  Ct Head Wo Contrast  05/27/2011  *RADIOLOGY REPORT*  Clinical Data:  Fall.  Head pain.  Neck pain.  CT HEAD WITHOUT CONTRAST CT CERVICAL SPINE WITHOUT CONTRAST  Technique:  Multidetector  CT imaging of the head and cervical spine was performed following the standard protocol without intravenous contrast.  Multiplanar CT image reconstructions of the cervical spine were also generated.  Comparison:  Prior studies 06/27/2010.  CT HEAD  Findings: There is no evidence for acute infarction, intracranial hemorrhage, mass lesion, hydrocephalus, or extra-axial fluid. Moderate to severe atrophy is present.  Advanced chronic microvascular ischemic change is present in the periventricular and subcortical white matter.  There is vascular calcification.  Calvarium is intact.  There is no acute sinus or mastoid disease.  Bilateral cataracts are present. Little change from priors.  IMPRESSION: Moderately advanced atrophy and small vessel disease.  No acute intracranial findings.  CT CERVICAL SPINE  Findings: Prior C1-2 fusion appears solid.  Multilevel spondylosis with disc space narrowing most pronounced at C5-C6.  No acute fracture is observed.  There is multilevel spinal stenosis, also worst at C5-6.  Multilevel facet disease is noted.  Vascular calcification is present.  Mild scoliosis convex left. Diffuse osseous demineralization.  Lung apices clear. Similar appearance when compared to priors.  IMPRESSION: No acute cervical spine findings.  Stable C1-2 fusion with instrumentation.  Multilevel spondylosis worst at C5-C6.  Original Report Authenticated By: Elsie Stain, M.D.   Ct Cervical Spine Wo Contrast  05/27/2011  *RADIOLOGY REPORT*  Clinical Data:  Fall.  Head pain.  Neck pain.  CT HEAD WITHOUT CONTRAST CT CERVICAL SPINE WITHOUT CONTRAST  Technique:  Multidetector CT imaging of the head and cervical spine was performed following the standard protocol without intravenous contrast.  Multiplanar CT image reconstructions of the cervical spine were also generated.  Comparison:  Prior studies 06/27/2010.  CT HEAD  Findings: There is no evidence for acute infarction, intracranial hemorrhage, mass lesion,  hydrocephalus, or extra-axial fluid. Moderate to severe atrophy is present.  Advanced chronic microvascular ischemic change is present in the periventricular and subcortical white matter.  There is vascular calcification.  Calvarium is intact.  There is no acute sinus or mastoid disease.  Bilateral cataracts are present. Little change from priors.  IMPRESSION: Moderately advanced atrophy and small vessel disease.  No acute intracranial findings.  CT CERVICAL SPINE  Findings: Prior C1-2 fusion appears solid.  Multilevel spondylosis with disc space narrowing most pronounced at  C5-C6.  No acute fracture is observed.  There is multilevel spinal stenosis, also worst at C5-6.  Multilevel facet disease is noted.  Vascular calcification is present.  Mild scoliosis convex left. Diffuse osseous demineralization.  Lung apices clear. Similar appearance when compared to priors.  IMPRESSION: No acute cervical spine findings.  Stable C1-2 fusion with instrumentation.  Multilevel spondylosis worst at C5-C6.  Original Report Authenticated By: Elsie Stain, M.D.     ED COURSE / COORDINATION OF CARE: 15:48 Discussed with Pt plan to CT head and plan to discharge.  16:39 Discussed imaging results with PT and discussed discharge.   The chart was scribed for me under my direct supervision.  I personally performed the history, physical, and medical decision making and all procedures in the evaluation of this patient.Benny Lennert, MD 05/27/11 [phone]

## 2011-05-27 NOTE — ED Notes (Signed)
Pt reports fell a few minutes ago at [employer] and hit head on night stand.  Denies any LOC.  EMS says fall was unwitnessed.  Pt c/o pain to back of head.

## 2011-05-27 NOTE — ED Notes (Signed)
Dr Estell Harpin at bedside, talking to pt. Pt to be discharged.

## 2011-06-20 NOTE — ED Provider Notes (Signed)
History     CSN: [id number] Arrival date & time: 04/19/2011 10:23 PM   None     Chief Complaint  Patient presents with  . Nausea  . Emesis    (Consider location/radiation/quality/duration/timing/severity/associated sxs/prior treatment) HPI Comments: Patient presents with nausea and vomiting since 1800. Denies fever, chills, diarrhea. Nothing makes it better. Nothing makes it worse.  Patient is a 77 y.o. female presenting with vomiting.  Emesis     Past Medical History  Diagnosis Date  . Hypertension   . Thyroid disease     hypothyroid  . Cancer     breast  . Hypothyroid   . Alzheimer disease   . Osteoporosis     Past Surgical History  Procedure Date  . Mastectomy     right    No family history on file.  History  Substance Use Topics  . Smoking status: Not on file  . Smokeless tobacco: Not on file  . Alcohol Use: No    OB History    Grav Para Term Preterm Abortions TAB SAB Ect Mult Living                  Review of Systems  Gastrointestinal: Positive for nausea and vomiting.  All other systems reviewed and are negative.    Allergies  Ace inhibitors; Penicillins; and Sulfa antibiotics  Home Medications   Current Outpatient Rx  Name Route Sig Dispense Refill  . ALPRAZOLAM 0.25 MG PO TABS Oral Take 0.125 mg by mouth every 8 (eight) hours.     . AMLODIPINE BESYLATE 10 MG PO TABS Oral Take 10 mg by mouth daily.      . ASPIRIN 81 MG PO TBEC Oral Take 81 mg by mouth daily.      Marland Kitchen CALCIUM CARBONATE 600 MG PO TABS Oral Take 600 mg by mouth 3 (three) times daily.      Marland Kitchen DOCUSATE SODIUM 100 MG PO CAPS Oral Take 100 mg by mouth 2 (two) times daily.      Marland Kitchen LABETALOL HCL 200 MG PO TABS Oral Take 200 mg by mouth 3 (three) times daily.      Marland Kitchen LACTULOSE 10 GM/15ML PO SOLN Oral Take 20 g by mouth daily.      Marland Kitchen LEVOTHYROXINE SODIUM 100 MCG PO TABS Oral Take 100 mcg by mouth daily.      Marland Kitchen MEMANTINE HCL 10 MG PO TABS Oral Take 10 mg by mouth 2 (two) times daily.       Marland Kitchen ENSURE PO Oral Take by mouth 2 (two) times daily.      Marland Kitchen POLYETHYLENE GLYCOL 3350 PO PACK Oral Take 17 g by mouth daily.      Marland Kitchen PROMETHAZINE HCL 25 MG PO TABS Oral Take 25 mg by mouth every 6 (six) hours as needed.      . PSYLLIUM 58.6 % PO POWD Oral Take 1 packet by mouth daily.      Marland Kitchen RIVASTIGMINE 4.6 MG/24HR TD PT24 Transdermal Place 1 patch onto the skin daily.      . SENOKOT PO Oral Take 2 tablets by mouth at bedtime.      . SERTRALINE HCL 25 MG PO TABS Oral Take 25 mg by mouth daily.      Marland Kitchen VITAMIN D (CHOLECALCIFEROL) PO Oral Take 1,000 mg by mouth 2 (two) times daily.      Marland Kitchen MIRTAZAPINE 30 MG PO TABS Oral Take 30 mg by mouth at bedtime.  BP 146/71  Pulse 73  Temp(Src) 98.1 F (36.7 C) (Oral)  Resp 17  SpO2 96%  Physical Exam  Nursing note and vitals reviewed. Constitutional: She is oriented to person, place, and time. She appears well-developed and well-nourished. She appears distressed.  HENT:  Head: Normocephalic.  Eyes: EOM are normal.  Neck: Normal range of motion.  Cardiovascular: Normal rate, normal heart sounds and intact distal pulses.   Pulmonary/Chest: Breath sounds normal.  Abdominal: Soft. She exhibits no distension. There is no tenderness. There is no rebound and no guarding.  Musculoskeletal: Normal range of motion.  Neurological: She is alert and oriented to person, place, and time.  Skin: Skin is warm and dry.    ED Course  Procedures (including critical care time)  Labs Reviewed  URINALYSIS, ROUTINE W REFLEX MICROSCOPIC - Abnormal; Notable for the following:    Appearance CLOUDY (*)    Hgb urine dipstick SMALL (*)    Protein, ur 100 (*)    All other components within normal limits  COMPREHENSIVE METABOLIC PANEL - Abnormal; Notable for the following:    Glucose, Bld 167 (*)    BUN 38 (*)    Creatinine, Ser 1.69 (*)    GFR calc non Af Amer 28 (*)    GFR calc Af Amer 34 (*)    All other components within normal limits  URINE  MICROSCOPIC-ADD ON - Abnormal; Notable for the following:    Bacteria, UA MANY (*)    All other components within normal limits  CBC  URINE CULTURE  LAB REPORT - SCANNED   No results found.   1. Urinary tract infection   2. Renal insufficiency       MDM          Nicoletta Dress. Colon Branch, MD 06/20/11 [phone]

## 2011-09-23 ENCOUNTER — Encounter (HOSPITAL_COMMUNITY): Payer: Self-pay | Admitting: *Deleted

## 2011-09-23 ENCOUNTER — Inpatient Hospital Stay (HOSPITAL_COMMUNITY)
Admission: EM | Admit: 2011-09-23 | Discharge: 2011-09-25 | DRG: 689 | Disposition: A | Discharge status: Skilled Nursing Facility | Payer: Medicare Other | Attending: Internal Medicine | Admitting: Internal Medicine

## 2011-09-23 ENCOUNTER — Emergency Department (HOSPITAL_COMMUNITY): Payer: Medicare Other

## 2011-09-23 DIAGNOSIS — Z66 Do not resuscitate: Secondary | ICD-10-CM | POA: Diagnosis present

## 2011-09-23 DIAGNOSIS — N184 Chronic kidney disease, stage 4 (severe): Secondary | ICD-10-CM | POA: Diagnosis present

## 2011-09-23 DIAGNOSIS — Z853 Personal history of malignant neoplasm of breast: Secondary | ICD-10-CM

## 2011-09-23 DIAGNOSIS — E039 Hypothyroidism, unspecified: Secondary | ICD-10-CM | POA: Diagnosis present

## 2011-09-23 DIAGNOSIS — Z79899 Other long term (current) drug therapy: Secondary | ICD-10-CM

## 2011-09-23 DIAGNOSIS — I129 Hypertensive chronic kidney disease with stage 1 through stage 4 chronic kidney disease, or unspecified chronic kidney disease: Secondary | ICD-10-CM | POA: Diagnosis present

## 2011-09-23 DIAGNOSIS — G92 Toxic encephalopathy: Secondary | ICD-10-CM | POA: Diagnosis present

## 2011-09-23 DIAGNOSIS — J90 Pleural effusion, not elsewhere classified: Secondary | ICD-10-CM

## 2011-09-23 DIAGNOSIS — Z7982 Long term (current) use of aspirin: Secondary | ICD-10-CM

## 2011-09-23 DIAGNOSIS — G309 Alzheimer's disease, unspecified: Secondary | ICD-10-CM | POA: Diagnosis present

## 2011-09-23 DIAGNOSIS — N390 Urinary tract infection, site not specified: Principal | ICD-10-CM | POA: Diagnosis present

## 2011-09-23 DIAGNOSIS — M810 Age-related osteoporosis without current pathological fracture: Secondary | ICD-10-CM | POA: Diagnosis present

## 2011-09-23 DIAGNOSIS — F0390 Unspecified dementia without behavioral disturbance: Secondary | ICD-10-CM | POA: Diagnosis present

## 2011-09-23 DIAGNOSIS — R609 Edema, unspecified: Secondary | ICD-10-CM

## 2011-09-23 DIAGNOSIS — R4182 Altered mental status, unspecified: Secondary | ICD-10-CM

## 2011-09-23 DIAGNOSIS — T424X5A Adverse effect of benzodiazepines, initial encounter: Secondary | ICD-10-CM | POA: Diagnosis present

## 2011-09-23 DIAGNOSIS — I10 Essential (primary) hypertension: Secondary | ICD-10-CM | POA: Diagnosis present

## 2011-09-23 DIAGNOSIS — F0280 Dementia in other diseases classified elsewhere without behavioral disturbance: Secondary | ICD-10-CM | POA: Diagnosis present

## 2011-09-23 DIAGNOSIS — G929 Unspecified toxic encephalopathy: Secondary | ICD-10-CM | POA: Diagnosis present

## 2011-09-23 DIAGNOSIS — G9340 Encephalopathy, unspecified: Secondary | ICD-10-CM | POA: Diagnosis present

## 2011-09-23 HISTORY — DX: Chronic kidney disease, stage 4 (severe): N18.4

## 2011-09-23 LAB — COMPREHENSIVE METABOLIC PANEL
ALT: 8 U/L (ref 0–35)
Albumin: 3.3 g/dL — ABNORMAL LOW (ref 3.5–5.2)
Alkaline Phosphatase: 82 U/L (ref 39–117)
Potassium: 4.2 mEq/L (ref 3.5–5.1)
Sodium: 137 mEq/L (ref 135–145)
Total Protein: 6.3 g/dL (ref 6.0–8.3)

## 2011-09-23 LAB — DIFFERENTIAL
Basophils Absolute: 0 10*3/uL (ref 0.0–0.1)
Basophils Relative: 0 % (ref 0–1)
Eosinophils Absolute: 0.2 10*3/uL (ref 0.0–0.7)
Neutrophils Relative %: 65 % (ref 43–77)

## 2011-09-23 LAB — URINALYSIS, ROUTINE W REFLEX MICROSCOPIC
Glucose, UA: NEGATIVE mg/dL
Specific Gravity, Urine: 1.01 (ref 1.005–1.030)
Urobilinogen, UA: 0.2 mg/dL (ref 0.0–1.0)

## 2011-09-23 LAB — URINE MICROSCOPIC-ADD ON

## 2011-09-23 LAB — CBC
MCH: 28.7 pg (ref 26.0–34.0)
MCHC: 32.3 g/dL (ref 30.0–36.0)
Platelets: 196 10*3/uL (ref 150–400)

## 2011-09-23 LAB — AMMONIA: Ammonia: 29 umol/L (ref 11–60)

## 2011-09-23 LAB — PRO B NATRIURETIC PEPTIDE: Pro B Natriuretic peptide (BNP): 872.9 pg/mL — ABNORMAL HIGH (ref 0–450)

## 2011-09-23 LAB — TSH: TSH: 50.212 u[IU]/mL — ABNORMAL HIGH (ref 0.350–4.500)

## 2011-09-23 MED ORDER — MEMANTINE HCL 10 MG PO TABS
10.0000 mg | ORAL_TABLET | Freq: Two times a day (BID) | ORAL | Status: DC
  Administered 2011-09-23 – 2011-09-25 (×4): 10 mg via ORAL
  Filled 2011-09-23 (×4): qty 1

## 2011-09-23 MED ORDER — CALCIUM CARBONATE 600 MG PO TABS
600.0000 mg | ORAL_TABLET | Freq: Three times a day (TID) | ORAL | Status: DC
  Filled 2011-09-23 (×6): qty 1

## 2011-09-23 MED ORDER — LEVOFLOXACIN IN D5W 750 MG/150ML IV SOLN: 750.0000 mg | INTRAVENOUS | Status: DC

## 2011-09-23 MED ORDER — LEVOFLOXACIN IN D5W 500 MG/100ML IV SOLN
500.0000 mg | INTRAVENOUS | Status: DC
  Administered 2011-09-24: 500 mg via INTRAVENOUS
  Filled 2011-09-23 (×2): qty 100

## 2011-09-23 MED ORDER — SODIUM CHLORIDE 0.9 % IJ SOLN
INTRAMUSCULAR | Status: AC
  Filled 2011-09-23: qty 3

## 2011-09-23 MED ORDER — ASPIRIN EC 81 MG PO TBEC
81.0000 mg | DELAYED_RELEASE_TABLET | Freq: Every day | ORAL | Status: DC
  Administered 2011-09-23 – 2011-09-25 (×3): 81 mg via ORAL
  Filled 2011-09-23 (×3): qty 1

## 2011-09-23 MED ORDER — LEVOFLOXACIN IN D5W 500 MG/100ML IV SOLN
INTRAVENOUS | Status: AC
  Filled 2011-09-23: qty 100

## 2011-09-23 MED ORDER — LABETALOL HCL 200 MG PO TABS
200.0000 mg | ORAL_TABLET | Freq: Three times a day (TID) | ORAL | Status: DC
  Administered 2011-09-23 – 2011-09-25 (×6): 200 mg via ORAL
  Filled 2011-09-23 (×6): qty 1

## 2011-09-23 MED ORDER — SODIUM CHLORIDE 0.9 % IV SOLN: INTRAVENOUS | Status: DC

## 2011-09-23 MED ORDER — ACETAMINOPHEN 650 MG RE SUPP: 650.0000 mg | Freq: Four times a day (QID) | RECTAL | Status: DC | PRN

## 2011-09-23 MED ORDER — POLYETHYLENE GLYCOL 3350 17 G PO PACK
17.0000 g | PACK | Freq: Every day | ORAL | Status: DC
  Administered 2011-09-24 – 2011-09-25 (×2): 17 g via ORAL
  Filled 2011-09-23 (×2): qty 1

## 2011-09-23 MED ORDER — LEVOTHYROXINE SODIUM 100 MCG PO TABS
100.0000 ug | ORAL_TABLET | Freq: Every day | ORAL | Status: DC
  Administered 2011-09-23: 100 ug via ORAL
  Filled 2011-09-23: qty 1

## 2011-09-23 MED ORDER — RIVASTIGMINE 4.6 MG/24HR TD PT24
4.6000 mg | MEDICATED_PATCH | Freq: Every day | TRANSDERMAL | Status: DC
  Administered 2011-09-24 – 2011-09-25 (×2): 4.6 mg via TRANSDERMAL
  Filled 2011-09-23 (×3): qty 1

## 2011-09-23 MED ORDER — ONDANSETRON HCL 4 MG/2ML IJ SOLN: 4.0000 mg | Freq: Four times a day (QID) | INTRAMUSCULAR | Status: DC | PRN

## 2011-09-23 MED ORDER — SERTRALINE HCL 50 MG PO TABS
25.0000 mg | ORAL_TABLET | Freq: Every day | ORAL | Status: DC
  Administered 2011-09-24 – 2011-09-25 (×2): 25 mg via ORAL
  Filled 2011-09-23 (×2): qty 1

## 2011-09-23 MED ORDER — ACETAMINOPHEN 325 MG PO TABS: 650.0000 mg | ORAL_TABLET | Freq: Four times a day (QID) | ORAL | Status: DC | PRN

## 2011-09-23 MED ORDER — MIRTAZAPINE 30 MG PO TABS
30.0000 mg | ORAL_TABLET | Freq: Every day | ORAL | Status: DC
  Administered 2011-09-23 – 2011-09-24 (×2): 30 mg via ORAL
  Filled 2011-09-23 (×2): qty 1

## 2011-09-23 MED ORDER — ENOXAPARIN SODIUM 30 MG/0.3ML ~~LOC~~ SOLN
30.0000 mg | SUBCUTANEOUS | Status: DC
  Administered 2011-09-23 – 2011-09-24 (×2): 30 mg via SUBCUTANEOUS
  Filled 2011-09-23 (×2): qty 0.3

## 2011-09-23 MED ORDER — AMLODIPINE BESYLATE 5 MG PO TABS
10.0000 mg | ORAL_TABLET | Freq: Every day | ORAL | Status: DC
  Administered 2011-09-23 – 2011-09-25 (×3): 10 mg via ORAL
  Filled 2011-09-23 (×3): qty 2

## 2011-09-23 MED ORDER — LEVOFLOXACIN IN D5W 500 MG/100ML IV SOLN
500.0000 mg | Freq: Once | INTRAVENOUS | Status: AC
  Administered 2011-09-23: 500 mg via INTRAVENOUS
  Filled 2011-09-23: qty 100

## 2011-09-23 MED ORDER — PANTOPRAZOLE SODIUM 40 MG PO TBEC
40.0000 mg | DELAYED_RELEASE_TABLET | Freq: Every day | ORAL | Status: DC
  Administered 2011-09-23 – 2011-09-25 (×3): 40 mg via ORAL
  Filled 2011-09-23 (×3): qty 1

## 2011-09-23 MED ORDER — DOCUSATE SODIUM 100 MG PO CAPS
100.0000 mg | ORAL_CAPSULE | Freq: Two times a day (BID) | ORAL | Status: DC
  Administered 2011-09-23 – 2011-09-25 (×4): 100 mg via ORAL
  Filled 2011-09-23 (×4): qty 1

## 2011-09-23 MED ORDER — ONDANSETRON HCL 4 MG PO TABS: 4.0000 mg | ORAL_TABLET | Freq: Four times a day (QID) | ORAL | Status: DC | PRN

## 2011-09-23 MED ORDER — LACTULOSE 10 GM/15ML PO SOLN
20.0000 g | Freq: Every day | ORAL | Status: DC
  Administered 2011-09-23 – 2011-09-25 (×3): 20 g via ORAL
  Filled 2011-09-23 (×3): qty 30

## 2011-09-23 NOTE — ED Notes (Signed)
Family member at bedside. Pt becomes agitated when you are doing tasks on her. Resting quietly at present.

## 2011-09-23 NOTE — Progress Notes (Signed)
Dr. Kerry Hough notified of Pharmacy reccomendations for Levaquin sine her creatinine clearence was 14.  New orders given and followed.    Text paged MD of blood noted on seat of depend and on tissue noted to him that the patient had a in and out catheter placed today and may be the problem.

## 2011-09-23 NOTE — H&P (Signed)
PCP:   Dwana Melena, MD, MD   Chief Complaint:  Altered mental status  HPI: This is a 78 year old female who is a resident of nursing home and has dementia. She was brought to the emergency room today after he was noted that she had a change in her mental status. It is unclear as to the exact events that transpired prior to her transfer. There is no family at bedside. I tried both contact numbers and was unable to reach anyone. The patient has dementia and is unable to reliably participate in history. History is obtained from the medical record. Apparently the patient has had poor by mouth intake and worsening mental status for the past 12 days. Also noted that she had questionable edema. She is brought to the ER for evaluation. Patient was found to be drowsy but arousable, further evaluation revealed that she may have a urinary tract infection. Patient has been referred for admission  Allergies:   Allergies  Allergen Reactions  . Ace Inhibitors   . Penicillins   . Sulfa Antibiotics       Past Medical History  Diagnosis Date  . Hypertension   . Thyroid disease     hypothyroid  . Cancer     breast  . Hypothyroid   . Alzheimer disease   . Osteoporosis     Past Surgical History  Procedure Date  . Mastectomy     right    Prior to Admission medications   Medication Sig Start Date End Date Taking? Authorizing Provider  amLODipine (NORVASC) 10 MG tablet Take 10 mg by mouth daily.     Yes Historical Provider, MD  aspirin 81 MG EC tablet Take 81 mg by mouth daily.     Yes Historical Provider, MD  calcium carbonate (OS-CAL) 600 MG TABS Take 600 mg by mouth 3 (three) times daily.     Yes Historical Provider, MD  docusate sodium (COLACE) 100 MG capsule Take 100 mg by mouth 2 (two) times daily.     Yes Historical Provider, MD  labetalol (NORMODYNE) 200 MG tablet Take 200 mg by mouth 3 (three) times daily.     Yes Historical Provider, MD  lactulose (CHRONULAC) 10 GM/15ML solution Take 20 g  by mouth daily.    Yes Historical Provider, MD  levothyroxine (SYNTHROID, LEVOTHROID) 100 MCG tablet Take 100 mcg by mouth daily.     Yes Historical Provider, MD  memantine (NAMENDA) 10 MG tablet Take 10 mg by mouth 2 (two) times daily.     Yes Historical Provider, MD  mirtazapine (REMERON) 30 MG tablet Take 30 mg by mouth at bedtime.     Yes Historical Provider, MD  Nutritional Supplements (ENSURE PO) Take 237 mLs by mouth 3 (three) times daily.    Yes Historical Provider, MD  omeprazole (PRILOSEC) 20 MG capsule Take 20 mg by mouth daily.   Yes Historical Provider, MD  ondansetron (ZOFRAN-ODT) 4 MG disintegrating tablet Take 4 mg by mouth every 8 (eight) hours as needed. nausea   Yes Historical Provider, MD  polyethylene glycol (MIRALAX / GLYCOLAX) packet Take 17 g by mouth daily.    Yes Historical Provider, MD  psyllium (METAMUCIL) 58.6 % powder Take 1 packet by mouth daily.     Yes Historical Provider, MD  rivastigmine (EXELON) 4.6 mg/24hr Place 1 patch onto the skin daily.     Yes Historical Provider, MD  Sennosides (SENOKOT PO) Take 2 tablets by mouth at bedtime.     Yes Historical Provider,  MD  sertraline (ZOLOFT) 25 MG tablet Take 25 mg by mouth daily.     Yes Historical Provider, MD  VITAMIN D, CHOLECALCIFEROL, PO Take 1,000 mg by mouth 2 (two) times daily.     Yes Historical Provider, MD  ALPRAZolam (XANAX) 0.25 MG tablet Take 0.125 mg by mouth every 6 (six) hours as needed. Severe agitation    Historical Provider, MD  promethazine (PHENERGAN) 25 MG tablet Take 25 mg by mouth every 6 (six) hours as needed. nausea    Historical Provider, MD    Social History:  does not have a smoking history on file. She does not have any smokeless tobacco history on file. She reports that she does not drink alcohol or use illicit drugs.  No family history on file.  Review of Systems:  Unable to assess due to patient's mental status   Physical Exam: Blood pressure 159/81, pulse 62, temperature 98.2  F (36.8 C), temperature source Rectal, resp. rate 18, SpO2 96.00%. General: Patient is awake, alert, pleasant, is not oriented to place or time HEENT: Normocephalic, atraumatic, mucous membranes are moist Chest: Clear to auscultation bilaterally Abdomen: Soft, nontender, nondistended, bowel sounds are active Cardiac: S1, S2, regular rate and rhythm Extremities: Trace edema bilaterally Neurologic: Nonfocal, grossly intact  Labs on Admission:  Results for orders placed during the hospital encounter of 09/23/11 (from the past 48 hour(s))  CBC     Status: Normal   Collection Time   09/23/11  8:09 AM      Component Value Range Comment   WBC 5.6  4.0 - 10.5 (K/uL)    RBC 4.18  3.87 - 5.11 (MIL/uL)    Hemoglobin 12.0  12.0 - 15.0 (g/dL)    HCT 78.2  95.6 - 21.3 (%)    MCV 89.0  78.0 - 100.0 (fL)    MCH 28.7  26.0 - 34.0 (pg)    MCHC 32.3  30.0 - 36.0 (g/dL)    RDW 08.6  57.8 - 46.9 (%)    Platelets 196  150 - 400 (K/uL)   DIFFERENTIAL     Status: Normal   Collection Time   09/23/11  8:09 AM      Component Value Range Comment   Neutrophils Relative 65  43 - 77 (%)    Neutro Abs 3.6  1.7 - 7.7 (K/uL)    Lymphocytes Relative 24  12 - 46 (%)    Lymphs Abs 1.3  0.7 - 4.0 (K/uL)    Monocytes Relative 8  3 - 12 (%)    Monocytes Absolute 0.4  0.1 - 1.0 (K/uL)    Eosinophils Relative 3  0 - 5 (%)    Eosinophils Absolute 0.2  0.0 - 0.7 (K/uL)    Basophils Relative 0  0 - 1 (%)    Basophils Absolute 0.0  0.0 - 0.1 (K/uL)   COMPREHENSIVE METABOLIC PANEL     Status: Abnormal   Collection Time   09/23/11  8:09 AM      Component Value Range Comment   Sodium 137  135 - 145 (mEq/L)    Potassium 4.2  3.5 - 5.1 (mEq/L)    Chloride 101  96 - 112 (mEq/L)    CO2 28  19 - 32 (mEq/L)    Glucose, Bld 98  70 - 99 (mg/dL)    BUN 45 (*) 6 - 23 (mg/dL)    Creatinine, Ser 6.29 (*) 0.50 - 1.10 (mg/dL)    Calcium 52.8  8.4 -  10.5 (mg/dL)    Total Protein 6.3  6.0 - 8.3 (g/dL)    Albumin 3.3 (*) 3.5 - 5.2  (g/dL)    AST 12  0 - 37 (U/L)    ALT 8  0 - 35 (U/L)    Alkaline Phosphatase 82  39 - 117 (U/L)    Total Bilirubin 0.2 (*) 0.3 - 1.2 (mg/dL)    GFR calc non Af Amer 23 (*) >90 (mL/min)    GFR calc Af Amer 27 (*) >90 (mL/min)   D-DIMER, QUANTITATIVE     Status: Abnormal   Collection Time   09/23/11  8:09 AM      Component Value Range Comment   D-Dimer, Quant 0.80 (*) 0.00 - 0.48 (ug/mL-FEU)   PRO B NATRIURETIC PEPTIDE     Status: Abnormal   Collection Time   09/23/11  8:13 AM      Component Value Range Comment   Pro B Natriuretic peptide (BNP) 872.9 (*) 0 - 450 (pg/mL)   URINALYSIS, ROUTINE W REFLEX MICROSCOPIC     Status: Abnormal   Collection Time   09/23/11  8:15 AM      Component Value Range Comment   Color, Urine YELLOW  YELLOW     APPearance HAZY (*) CLEAR     Specific Gravity, Urine 1.010  1.005 - 1.030     pH 6.5  5.0 - 8.0     Glucose, UA NEGATIVE  NEGATIVE (mg/dL)    Hgb urine dipstick SMALL (*) NEGATIVE     Bilirubin Urine NEGATIVE  NEGATIVE     Ketones, ur NEGATIVE  NEGATIVE (mg/dL)    Protein, ur NEGATIVE  NEGATIVE (mg/dL)    Urobilinogen, UA 0.2  0.0 - 1.0 (mg/dL)    Nitrite POSITIVE (*) NEGATIVE     Leukocytes, UA NEGATIVE  NEGATIVE    URINE MICROSCOPIC-ADD ON     Status: Abnormal   Collection Time   09/23/11  8:15 AM      Component Value Range Comment   Squamous Epithelial / LPF RARE  RARE     WBC, UA 3-6  <3 (WBC/hpf)    RBC / HPF 3-6  <3 (RBC/hpf)    Bacteria, UA MANY (*) RARE      Radiological Exams on Admission: Ct Head Wo Contrast  09/23/2011  *RADIOLOGY REPORT*  Clinical Data: Altered level of consciousness  CT HEAD WITHOUT CONTRAST  Technique:  Contiguous axial images were obtained from the base of the skull through the vertex without contrast.  Comparison: 05/27/2011  Findings: Cortical volume loss noted with proportional ventricular prominence.  Periventricular white matter hypodensity likely indicates small vessel ischemic change.  No acute hemorrhage,  acute infarction, or mass lesion is identified.  Remote left caudate head CSF density lacunar infarct is stable.  No midline shift.  Chronic consolidation of the right maxillary sinus with inspissated secretions and osseous sclerosis/thickening noted.  Left maxillary sinus mucous retention cyst or polyp noted.  Ethmoid mucoperiosteal thickening noted.  Orbits are unremarkable.  No acute osseous abnormality.  IMPRESSION: No acute or evidence of cranial finding.  Chronic findings as above.  Original Report Authenticated By: Harrel Lemon, M.D.   Dg Chest Port 1 View  09/23/2011  *RADIOLOGY REPORT*  Clinical Data: Altered mental status  PORTABLE CHEST - 1 VIEW  Comparison: April 19, 2011 and March 05, 2011  Findings: There is left basilar airspace opacity and effusion.  The right lung is clear.  The cardiac silhouette, mediastinum, and  pulmonary vasculature are within normal limits.  IMPRESSION: Increased left basilar airspace opacity and blunting of the costophrenic angle, consistent with infiltrate and effusion.  Original Report Authenticated By: Brandon Melnick, M.D.    Assessment/Plan Active Problems:  Altered mental state  Urinary tract infection  Dementia  Hypothyroidism  CKD (chronic kidney disease) stage 4, GFR 15-29 ml/min  Hypertension  Plan:  It's not clear as to the patient's baseline mental status. Her urinary tract infection may be playing a role here. She'll be started empirically on Levaquin. Will followup cultures. Patient is also on benzodiazepines.  These will be held for now. We will ask physical therapy and occupational therapy to see the patient. I suspect that her mental status is close to her baseline. We will encourage by mouth intake. Continue her outpatient medications for hypertension and hypothyroidism. We will also check TSH and ammonia. Her renal function appears to be at her baseline. CT scan of the head is negative. Anticipate discharge back to nursing home next 24-48  hours.  Per ER MD note patient is a DO NOT RESUSCITATE  Time Spent on Admission:  MEMON,JEHANZEB [facility] Pager: [phone] 09/23/2011, 3:36 PM

## 2011-09-23 NOTE — ED Provider Notes (Signed)
History     CSN: [id number]  Arrival date & time 09/23/11  0725   First MD Initiated Contact with Patient 09/23/11 0732      Chief Complaint  Patient presents with  . Leg Swelling   Level V caveat for dementia  (Consider location/radiation/quality/duration/timing/severity/associated sxs/prior treatment) HPI  Patient presents from her nursing facility where they told EMS patient was swollen today and lethargic. Patient is sleeping but easily awakened. She is cooperative. She denies headache, chest pain, stomach pain, pain in her legs.  DO NOT RESUSCITATE  PCP Dr. Catalina Pizza  Past Medical History  Diagnosis Date  . Hypertension   . Thyroid disease     hypothyroid  . Cancer     breast  . Hypothyroid   . Alzheimer disease   . Osteoporosis     Past Surgical History  Procedure Date  . Mastectomy     right    No family history on file.  History  Substance Use Topics  . Smoking status: Not on file  . Smokeless tobacco: Not on file  . Alcohol Use: No   patient lives in a nursing facility Widowed Per nursing home records patient has urinary and rectal incontinence, and uses a wheelchair  OB History    Grav Para Term Preterm Abortions TAB SAB Ect Mult Living                  Review of Systems  Unable to perform ROS: Dementia    Allergies  Ace inhibitors; Penicillins; and Sulfa antibiotics  Home Medications   Current Outpatient Rx  Name Route Sig Dispense Refill  . ALPRAZOLAM 0.25 MG PO TABS Oral Take 0.125 mg by mouth every 8 (eight) hours.     . AMLODIPINE BESYLATE 10 MG PO TABS Oral Take 10 mg by mouth daily.      . ASPIRIN 81 MG PO TBEC Oral Take 81 mg by mouth daily.      Marland Kitchen CALCIUM CARBONATE 600 MG PO TABS Oral Take 600 mg by mouth 3 (three) times daily.      Marland Kitchen DOCUSATE SODIUM 100 MG PO CAPS Oral Take 100 mg by mouth 2 (two) times daily.      Marland Kitchen LABETALOL HCL 200 MG PO TABS Oral Take 200 mg by mouth 3 (three) times daily.      Marland Kitchen LACTULOSE 10 GM/15ML  PO SOLN Oral Take 20 g by mouth daily.      Marland Kitchen LEVOTHYROXINE SODIUM 100 MCG PO TABS Oral Take 100 mcg by mouth daily.      Marland Kitchen MEMANTINE HCL 10 MG PO TABS Oral Take 10 mg by mouth 2 (two) times daily.      Marland Kitchen MIRTAZAPINE 30 MG PO TABS Oral Take 30 mg by mouth at bedtime.      . ENSURE PO Oral Take by mouth 2 (two) times daily.      Marland Kitchen POLYETHYLENE GLYCOL 3350 PO PACK Oral Take 17 g by mouth daily.      Marland Kitchen PROMETHAZINE HCL 25 MG PO TABS Oral Take 25 mg by mouth every 6 (six) hours as needed.      . PSYLLIUM 58.6 % PO POWD Oral Take 1 packet by mouth daily.      Marland Kitchen RIVASTIGMINE 4.6 MG/24HR TD PT24 Transdermal Place 1 patch onto the skin daily.      . SENOKOT PO Oral Take 2 tablets by mouth at bedtime.      . SERTRALINE HCL 25 MG PO  TABS Oral Take 25 mg by mouth daily.      Marland Kitchen VITAMIN D (CHOLECALCIFEROL) PO Oral Take 1,000 mg by mouth 2 (two) times daily.        BP 160/86  Pulse 68  Temp(Src) 98.8 F (37.1 C) (Oral)  Resp 18  SpO2 95%  Physical Exam  Nursing note and vitals reviewed. Constitutional:  Non-toxic appearance. She does not appear ill. No distress.       Thin elderly female who is lying flat sleeping peacefully  HENT:  Head: Normocephalic and atraumatic.  Right Ear: External ear normal.  Left Ear: External ear normal.  Nose: Nose normal. No mucosal edema or rhinorrhea.  Mouth/Throat: Oropharynx is clear and moist and mucous membranes are normal. No dental abscesses or uvula swelling.  Eyes: Conjunctivae and EOM are normal. Pupils are equal, round, and reactive to light.  Neck: Normal range of motion and full passive range of motion without pain. Neck supple.  Cardiovascular: Normal rate, regular rhythm and normal heart sounds.  Exam reveals no gallop and no friction rub.   No murmur heard. Pulmonary/Chest: Effort normal and breath sounds normal. No respiratory distress. She has no wheezes. She has no rhonchi. She has no rales. She exhibits no tenderness and no crepitus.    Abdominal: Soft. Normal appearance and bowel sounds are normal. She exhibits no distension. There is no tenderness. There is no rebound and no guarding.  Musculoskeletal: Normal range of motion. She exhibits edema. She exhibits no tenderness.       The only edema I find on the patient are the dorsum of her feet bilaterally. The skin is normal color and not red or warm to touch. Marked deformity of both toes which are angulated laterally across the top of her other toes. Appears chronic when I palpate her feet she does not appear to be painful.  Neurological: She has normal strength. No cranial nerve deficit.       Patient sleeping, easily arousable, patient follows simple commands to facilitate her exam  Skin: Skin is warm, dry and intact. No rash noted. No erythema. No pallor.  Psychiatric: She has a normal mood and affect. Her speech is normal and behavior is normal. Her mood appears not anxious.    ED Course  Procedures (including critical care time)  Relative here who sees patient every night at dinner. States she noticed a change in MS about 12 days ago, pt was more sleepy and not as energetic as usual. She states the last physician office visit she was down from 95 pounds to 90 pounds. States patient used to walk with walker, but hasn't walked in past 12 days. States pt c/o being cold all the time. States she can feed herself.   9:42 Dr Gwendalyn Ege admit to med-surg team 2   Medications  acetaminophen (TYLENOL) tablet 650 mg (not administered)    Or  acetaminophen (TYLENOL) suppository 650 mg (not administered)  ondansetron (ZOFRAN) tablet 4 mg (not administered)    Or  ondansetron (ZOFRAN) injection 4 mg (not administered)  levofloxacin (LEVAQUIN) IVPB 500 mg (not administered)  sodium chloride 0.9 % injection (not administered)  levofloxacin (LEVAQUIN) IVPB 500 mg (0 mg Intravenous Stopped 09/23/11 1052)      Results for orders placed during the hospital encounter of 09/23/11  CBC       Component Value Range   WBC 5.6  4.0 - 10.5 (K/uL)   RBC 4.18  3.87 - 5.11 (MIL/uL)   Hemoglobin 12.0  12.0 - 15.0 (g/dL)   HCT 16.1  09.6 - 04.5 (%)   MCV 89.0  78.0 - 100.0 (fL)   MCH 28.7  26.0 - 34.0 (pg)   MCHC 32.3  30.0 - 36.0 (g/dL)   RDW 40.9  81.1 - 91.4 (%)   Platelets 196  150 - 400 (K/uL)  DIFFERENTIAL      Component Value Range   Neutrophils Relative 65  43 - 77 (%)   Neutro Abs 3.6  1.7 - 7.7 (K/uL)   Lymphocytes Relative 24  12 - 46 (%)   Lymphs Abs 1.3  0.7 - 4.0 (K/uL)   Monocytes Relative 8  3 - 12 (%)   Monocytes Absolute 0.4  0.1 - 1.0 (K/uL)   Eosinophils Relative 3  0 - 5 (%)   Eosinophils Absolute 0.2  0.0 - 0.7 (K/uL)   Basophils Relative 0  0 - 1 (%)   Basophils Absolute 0.0  0.0 - 0.1 (K/uL)  COMPREHENSIVE METABOLIC PANEL      Component Value Range   Sodium 137  135 - 145 (mEq/L)   Potassium 4.2  3.5 - 5.1 (mEq/L)   Chloride 101  96 - 112 (mEq/L)   CO2 28  19 - 32 (mEq/L)   Glucose, Bld 98  70 - 99 (mg/dL)   BUN 45 (*) 6 - 23 (mg/dL)   Creatinine, Ser 7.82 (*) 0.50 - 1.10 (mg/dL)   Calcium 95.6  8.4 - 10.5 (mg/dL)   Total Protein 6.3  6.0 - 8.3 (g/dL)   Albumin 3.3 (*) 3.5 - 5.2 (g/dL)   AST 12  0 - 37 (U/L)   ALT 8  0 - 35 (U/L)   Alkaline Phosphatase 82  39 - 117 (U/L)   Total Bilirubin 0.2 (*) 0.3 - 1.2 (mg/dL)   GFR calc non Af Amer 23 (*) >90 (mL/min)   GFR calc Af Amer 27 (*) >90 (mL/min)  PRO B NATRIURETIC PEPTIDE      Component Value Range   Pro B Natriuretic peptide (BNP) 872.9 (*) 0 - 450 (pg/mL)  D-DIMER, QUANTITATIVE      Component Value Range   D-Dimer, Quant 0.80 (*) 0.00 - 0.48 (ug/mL-FEU)  URINALYSIS, ROUTINE W REFLEX MICROSCOPIC      Component Value Range   Color, Urine YELLOW  YELLOW    APPearance HAZY (*) CLEAR    Specific Gravity, Urine 1.010  1.005 - 1.030    pH 6.5  5.0 - 8.0    Glucose, UA NEGATIVE  NEGATIVE (mg/dL)   Hgb urine dipstick SMALL (*) NEGATIVE    Bilirubin Urine NEGATIVE  NEGATIVE    Ketones, ur  NEGATIVE  NEGATIVE (mg/dL)   Protein, ur NEGATIVE  NEGATIVE (mg/dL)   Urobilinogen, UA 0.2  0.0 - 1.0 (mg/dL)   Nitrite POSITIVE (*) NEGATIVE    Leukocytes, UA NEGATIVE  NEGATIVE   URINE MICROSCOPIC-ADD ON      Component Value Range   Squamous Epithelial / LPF RARE  RARE    WBC, UA 3-6  <3 (WBC/hpf)   RBC / HPF 3-6  <3 (RBC/hpf)   Bacteria, UA MANY (*) RARE    Laboratory interpretation all normal except UTI, mildly elevated Ddimer and BNP, mildly increased renal insuffiency   Dg Chest Port 1 View  09/23/2011  *RADIOLOGY REPORT*  Clinical Data: Altered mental status  PORTABLE CHEST - 1 VIEW  Comparison: April 19, 2011 and March 05, 2011  Findings: There is left basilar airspace opacity  and effusion.  The right lung is clear.  The cardiac silhouette, mediastinum, and pulmonary vasculature are within normal limits.  IMPRESSION: Increased left basilar airspace opacity and blunting of the costophrenic angle, consistent with infiltrate and effusion.  Original Report Authenticated By: Brandon Melnick, M.D.   Diagnoses that have been ruled out:  None  Diagnoses that are still under consideration:  None  Final diagnoses:  Peripheral edema  Urinary tract infection  Pleural effusion on left  Altered mental state   Plan admission  Devoria Albe, MD, FACEP     MDM          Ward Givens, MD 09/23/11 1946

## 2011-09-23 NOTE — Progress Notes (Signed)
Discuss with Dr. Kerry Hough about the patients diet. He states if the patient can tolerate a clear liquids then she is okay to eat.  I gave the patient some water and applesauce and she tolerated without difficulty.  AEB no choking, problems swallowing, or coughing after applesauce or water.  Will continue to mounter.  According to her family she stated that the patient is on chopped meats at home, so I ordered her mechanical soft diet.  MD stated this diet was okay.

## 2011-09-23 NOTE — ED Notes (Signed)
Pt sleeping. Waiting for CT and room assignment.

## 2011-09-23 NOTE — ED Notes (Signed)
Pt sent from [facility] for swelling. Pt has small amount edema in feet but no other noted. Pt alert but confused. Shakes head yes or no to questions. Denies pain. Pt able to move arms and help take her shirts off. Pt only verbalized when taking her B/P and it was inflating. Pt has on disposable diaper and is dry.

## 2011-09-23 NOTE — ED Notes (Signed)
Swollen all over when she woke up this mornining. Seemed lethargic this morning. Only swelling noted at time of arrival is on top of the foot bilat.

## 2011-09-24 LAB — MRSA PCR SCREENING: MRSA by PCR: NEGATIVE

## 2011-09-24 LAB — CBC
MCHC: 32.3 g/dL (ref 30.0–36.0)
Platelets: 179 10*3/uL (ref 150–400)
RDW: 14.3 % (ref 11.5–15.5)
WBC: 5.4 10*3/uL (ref 4.0–10.5)

## 2011-09-24 MED ORDER — CALCIUM CARBONATE 1250 (500 CA) MG PO TABS
1.0000 | ORAL_TABLET | Freq: Three times a day (TID) | ORAL | Status: DC
  Administered 2011-09-24 – 2011-09-25 (×5): 500 mg via ORAL
  Filled 2011-09-24 (×4): qty 1

## 2011-09-24 MED ORDER — SODIUM CHLORIDE 0.9 % IJ SOLN
INTRAMUSCULAR | Status: AC
  Filled 2011-09-24: qty 3

## 2011-09-24 MED ORDER — CALCIUM CARBONATE 600 MG PO TABS: 600.0000 mg | ORAL_TABLET | Freq: Three times a day (TID) | ORAL | Status: DC

## 2011-09-24 MED ORDER — LEVOTHYROXINE SODIUM 75 MCG PO TABS
150.0000 ug | ORAL_TABLET | Freq: Every day | ORAL | Status: DC
  Administered 2011-09-25: 150 ug via ORAL
  Filled 2011-09-24 (×2): qty 2

## 2011-09-24 NOTE — Progress Notes (Signed)
CSW met with pt to begin discharge planning. Pt is from [facility] [phone]) and per the Charge RN Huntley Dec) at that facility, pt is able to return when medically stable for discharge. Pt is in agreement with this plan. CSW contacted pt's emergency contact, Avel Peace (pt's niece) to review if there were any further needs. According to [facility], there are no further needs at this time. She also states that she plans to transport the pt back to [place] herself at time of discharge. Linda added that the pt's HPOA is her brother, Carlis Abbott. No further needs identified at this time. CSW will continue to follow to ensure pt is returned to [facility]. (psychosocial assessment placed in pt's shadow chart).  [place] Crane, MSW, [employer] Clinical Social Worker

## 2011-09-24 NOTE — Progress Notes (Signed)
Subjective: Patient had some agitation last night, no further reports of bleeding, spoke to family member at bedside and she reports that patient appears improved today.  Objective: Vital signs in last 24 hours: Temp:  [97.6 F (36.4 C)-98.8 F (37.1 C)] 97.6 F (36.4 C) (02/03 0500) Pulse Rate:  [60-84] 65  (02/03 0500) Resp:  [18-20] 20  (02/03 0500) BP: (159-171)/(79-89) 166/89 mmHg (02/03 0500) SpO2:  [94 %-98 %] 95 % (02/03 0500) Weight:  [46.1 kg (101 lb 10.1 oz)] 46.1 kg (101 lb 10.1 oz) (02/02 1138) Weight change:  Last BM Date: 09/22/11 (unknown)  Intake/Output from previous day: 02/02 0701 - 02/03 0700 In: 120 [P.O.:120] Out: 600 [Urine:600]     Physical Exam: General: Alert, awake,, in no acute distress. HEENT: No bruits, no goiter. Heart: Regular rate and rhythm, without murmurs, rubs, gallops. Lungs: Clear to auscultation bilaterally. Abdomen: Soft, nontender, nondistended, positive bowel sounds. Extremities: No clubbing cyanosis or edema with positive pedal pulses. Neuro: Grossly intact, nonfocal.    Lab Results: Basic Metabolic Panel:  Basename 09/23/11 0809  NA 137  K 4.2  CL 101  CO2 28  GLUCOSE 98  BUN 45*  CREATININE 1.82*  CALCIUM 10.0  MG --  PHOS --   Liver Function Tests:  Basename 09/23/11 0809  AST 12  ALT 8  ALKPHOS 82  BILITOT 0.2*  PROT 6.3  ALBUMIN 3.3*   No results found for this basename: LIPASE:2,AMYLASE:2 in the last 72 hours  Basename 09/23/11 1657  AMMONIA 29   CBC:  Basename 09/24/11 0808 09/23/11 0809  WBC 5.4 5.6  NEUTROABS -- 3.6  HGB 11.5* 12.0  HCT 35.6* 37.2  MCV 88.3 89.0  PLT 179 196   Cardiac Enzymes: No results found for this basename: CKTOTAL:3,CKMB:3,CKMBINDEX:3,TROPONINI:3 in the last 72 hours BNP:  Basename 09/23/11 0813  PROBNP 872.9*   D-Dimer:  Alvira Philips 09/23/11 0809  DDIMER 0.80*   CBG: No results found for this basename: GLUCAP:6 in the last 72 hours Hemoglobin A1C: No  results found for this basename: HGBA1C in the last 72 hours Fasting Lipid Panel: No results found for this basename: CHOL,HDL,LDLCALC,TRIG,CHOLHDL,LDLDIRECT in the last 72 hours Thyroid Function Tests:  Basename 09/23/11 0800  TSH 50.212*  T4TOTAL --  FREET4 --  T3FREE --  THYROIDAB --   Anemia Panel: No results found for this basename: VITAMINB12,FOLATE,FERRITIN,TIBC,IRON,RETICCTPCT in the last 72 hours Coagulation: No results found for this basename: LABPROT:2,INR:2 in the last 72 hours Urine Drug Screen: Drugs of Abuse  No results found for this basename: labopia, cocainscrnur, labbenz, amphetmu, thcu, labbarb    Alcohol Level: No results found for this basename: ETH:2 in the last 72 hours Urinalysis:  Basename 09/23/11 0815  COLORURINE YELLOW  LABSPEC 1.010  PHURINE 6.5  GLUCOSEU NEGATIVE  HGBUR SMALL*  BILIRUBINUR NEGATIVE  KETONESUR NEGATIVE  PROTEINUR NEGATIVE  UROBILINOGEN 0.2  NITRITE POSITIVE*  LEUKOCYTESUR NEGATIVE   No results found for this or any previous visit (from the past 240 hour(s)).  Studies/Results: Ct Head Wo Contrast  09/23/2011  *RADIOLOGY REPORT*  Clinical Data: Altered level of consciousness  CT HEAD WITHOUT CONTRAST  Technique:  Contiguous axial images were obtained from the base of the skull through the vertex without contrast.  Comparison: 05/27/2011  Findings: Cortical volume loss noted with proportional ventricular prominence.  Periventricular white matter hypodensity likely indicates small vessel ischemic change.  No acute hemorrhage, acute infarction, or mass lesion is identified.  Remote left caudate head CSF density lacunar  infarct is stable.  No midline shift.  Chronic consolidation of the right maxillary sinus with inspissated secretions and osseous sclerosis/thickening noted.  Left maxillary sinus mucous retention cyst or polyp noted.  Ethmoid mucoperiosteal thickening noted.  Orbits are unremarkable.  No acute osseous abnormality.   IMPRESSION: No acute or evidence of cranial finding.  Chronic findings as above.  Original Report Authenticated By: Harrel Lemon, M.D.   Dg Chest Port 1 View  09/23/2011  *RADIOLOGY REPORT*  Clinical Data: Altered mental status  PORTABLE CHEST - 1 VIEW  Comparison: April 19, 2011 and March 05, 2011  Findings: There is left basilar airspace opacity and effusion.  The right lung is clear.  The cardiac silhouette, mediastinum, and pulmonary vasculature are within normal limits.  IMPRESSION: Increased left basilar airspace opacity and blunting of the costophrenic angle, consistent with infiltrate and effusion.  Original Report Authenticated By: Brandon Melnick, M.D.    Medications: Scheduled Meds:   . amLODipine  10 mg Oral Daily  . aspirin EC  81 mg Oral Daily  . calcium carbonate  1 tablet Oral TID WC  . docusate sodium  100 mg Oral BID  . enoxaparin  30 mg Subcutaneous Q24H  . labetalol  200 mg Oral TID  . lactulose  20 g Oral Daily  . levofloxacin  500 mg Intravenous Once  . levofloxacin (LEVAQUIN) IV  500 mg Intravenous Q24H  . levothyroxine  100 mcg Oral Daily  . memantine  10 mg Oral BID  . mirtazapine  30 mg Oral QHS  . pantoprazole  40 mg Oral Q1200  . polyethylene glycol  17 g Oral Daily  . rivastigmine  4.6 mg Transdermal Daily  . sertraline  25 mg Oral Daily  . sodium chloride      . DISCONTD: calcium carbonate  600 mg Oral TID  . DISCONTD: calcium carbonate  600 mg Oral TID WC  . DISCONTD: levofloxacin (LEVAQUIN) IV  750 mg Intravenous Q24H   Continuous Infusions:   . sodium chloride     PRN Meds:.acetaminophen, acetaminophen, ondansetron (ZOFRAN) IV, ondansetron  Assessment/Plan:  Active Problems:  Altered mental state  Urinary tract infection  Dementia  Hypothyroidism  CKD (chronic kidney disease) stage 4, GFR 15-29 ml/min  Hypertension  Plan:  Her alteration in mental status is likely multifactorial.  Patient may have an underlying urinary tract  infection.  She has been started on levaquin and we will follow up cultures.  She also is significantly hypothyroid with a TSH of 50.  We will increase her levothyroxine and check T3, T4.  She will need repeat thyroid studies in 4-6 weeks.  Patient has also been on benzodiazepines which may also be contributing here.  These have been held since she has been in the hospital.  Her renal function appears to be at near her baseline.  Will plan on discharge back to SNF in the morning once culture data has returned.   LOS: 1 day   Jeanett Antonopoulos [facility] Pager: [phone] 09/24/2011, 10:03 AM

## 2011-09-25 DIAGNOSIS — G9340 Encephalopathy, unspecified: Secondary | ICD-10-CM | POA: Diagnosis present

## 2011-09-25 LAB — CBC
Hemoglobin: 10.6 g/dL — ABNORMAL LOW (ref 12.0–15.0)
MCH: 28.4 pg (ref 26.0–34.0)
Platelets: 182 10*3/uL (ref 150–400)
RBC: 3.73 MIL/uL — ABNORMAL LOW (ref 3.87–5.11)
WBC: 4.7 10*3/uL (ref 4.0–10.5)

## 2011-09-25 LAB — BASIC METABOLIC PANEL
Calcium: 9.5 mg/dL (ref 8.4–10.5)
GFR calc Af Amer: 28 mL/min — ABNORMAL LOW (ref >90)
GFR calc non Af Amer: 24 mL/min — ABNORMAL LOW (ref >90)
Potassium: 3.7 mEq/L (ref 3.5–5.1)
Sodium: 139 mEq/L (ref 135–145)

## 2011-09-25 LAB — T3: T3, Total: 54.5 ng/dl — ABNORMAL LOW (ref 80.0–204.0)

## 2011-09-25 LAB — T4, FREE: Free T4: 1.13 ng/dL (ref 0.80–1.80)

## 2011-09-25 MED ORDER — LEVOFLOXACIN 500 MG PO TABS: 500.0000 mg | ORAL_TABLET | Freq: Every day | ORAL | Status: AC

## 2011-09-25 MED ORDER — LEVOTHYROXINE SODIUM 150 MCG PO TABS: 150.0000 ug | ORAL_TABLET | Freq: Every day | ORAL | Status: DC

## 2011-09-25 NOTE — Progress Notes (Addendum)
Pt d/c today by MD back to [facility].  Pt, pt's cousin Bonita Quin, and facility are aware and agreeable.  Pt to transfer with Bonita Quin.  Shanda Bumps, RN, reviewed [facility] for accuracy.  D/C summary and FL2 faxed.  Karn Cassis

## 2011-09-25 NOTE — Progress Notes (Signed)
Discharge instructions and packet given to family member who verbalized understanding, patient condition stable, out via w/c with staff.

## 2011-09-25 NOTE — Discharge Summary (Signed)
Physician Discharge Summary  Patient ID: Martha Pearson MRN: [id number] DOB/AGE: 1918-06-02 78 y.o.  Admit date: 09/23/2011 Discharge date: 09/25/2011  Primary Care Physician:  Dwana Melena, MD, MD   Discharge Diagnoses:    Principal Problem:  *Encephalopathy Active Problems:  Urinary tract infection  Dementia  Hypothyroidism  CKD (chronic kidney disease) stage 4, GFR 15-29 ml/min  Hypertension    Medication List  As of 09/25/2011 11:31 AM   STOP taking these medications         ALPRAZolam 0.25 MG tablet         TAKE these medications         amLODipine 10 MG tablet   Commonly known as: NORVASC   Take 10 mg by mouth daily.      aspirin 81 MG EC tablet   Take 81 mg by mouth daily.      calcium carbonate 600 MG Tabs   Commonly known as: OS-CAL   Take 600 mg by mouth 3 (three) times daily.      docusate sodium 100 MG capsule   Commonly known as: COLACE   Take 100 mg by mouth 2 (two) times daily.      ENSURE PO   Take 237 mLs by mouth 3 (three) times daily.      labetalol 200 MG tablet   Commonly known as: NORMODYNE   Take 200 mg by mouth 3 (three) times daily.      lactulose 10 GM/15ML solution   Commonly known as: CHRONULAC   Take 20 g by mouth daily.      levofloxacin 500 MG tablet   Commonly known as: LEVAQUIN   Take 1 tablet (500 mg total) by mouth daily.      levothyroxine 150 MCG tablet   Commonly known as: SYNTHROID, LEVOTHROID   Take 1 tablet (150 mcg total) by mouth daily.      memantine 10 MG tablet   Commonly known as: NAMENDA   Take 10 mg by mouth 2 (two) times daily.      mirtazapine 30 MG tablet   Commonly known as: REMERON   Take 30 mg by mouth at bedtime.      omeprazole 20 MG capsule   Commonly known as: PRILOSEC   Take 20 mg by mouth daily.      ondansetron 4 MG disintegrating tablet   Commonly known as: ZOFRAN-ODT   Take 4 mg by mouth every 8 (eight) hours as needed. nausea      polyethylene glycol packet   Commonly known as:  MIRALAX / GLYCOLAX   Take 17 g by mouth daily.      promethazine 25 MG tablet   Commonly known as: PHENERGAN   Take 25 mg by mouth every 6 (six) hours as needed. nausea      psyllium 58.6 % powder   Commonly known as: METAMUCIL   Take 1 packet by mouth daily.      rivastigmine 4.6 mg/24hr   Commonly known as: EXELON   Place 1 patch onto the skin daily.      SENOKOT PO   Take 2 tablets by mouth at bedtime.      sertraline 25 MG tablet   Commonly known as: ZOLOFT   Take 25 mg by mouth daily.      VITAMIN D (CHOLECALCIFEROL) PO   Take 1,000 mg by mouth 2 (two) times daily.           Discharge Exam: Blood pressure 144/72, pulse 68, temperature  98.3 F (36.8 C), temperature source Oral, resp. rate 20, height 5\' 2"  (1.575 m), weight 46.1 kg (101 lb 10.1 oz), SpO2 96.00%. NAD CTA B S1,S2 RRR Soft, BS+ No edema b/l Awake, alert, mild confusion  Disposition and Follow-up:  Patient will be discharged back to [facility] and can follow up with Dr. Margo Aye in the next 2 weeks  Consults:  none   Significant Diagnostic Studies:  Ct Head Wo Contrast  09/23/2011  *RADIOLOGY REPORT*  Clinical Data: Altered level of consciousness  CT HEAD WITHOUT CONTRAST  Technique:  Contiguous axial images were obtained from the base of the skull through the vertex without contrast.  Comparison: 05/27/2011  Findings: Cortical volume loss noted with proportional ventricular prominence.  Periventricular white matter hypodensity likely indicates small vessel ischemic change.  No acute hemorrhage, acute infarction, or mass lesion is identified.  Remote left caudate head CSF density lacunar infarct is stable.  No midline shift.  Chronic consolidation of the right maxillary sinus with inspissated secretions and osseous sclerosis/thickening noted.  Left maxillary sinus mucous retention cyst or polyp noted.  Ethmoid mucoperiosteal thickening noted.  Orbits are unremarkable.  No acute osseous abnormality.   IMPRESSION: No acute or evidence of cranial finding.  Chronic findings as above.  Original Report Authenticated By: Harrel Lemon, M.D.   Dg Chest Port 1 View  09/23/2011  *RADIOLOGY REPORT*  Clinical Data: Altered mental status  PORTABLE CHEST - 1 VIEW  Comparison: April 19, 2011 and March 05, 2011  Findings: There is left basilar airspace opacity and effusion.  The right lung is clear.  The cardiac silhouette, mediastinum, and pulmonary vasculature are within normal limits.  IMPRESSION: Increased left basilar airspace opacity and blunting of the costophrenic angle, consistent with infiltrate and effusion.  Original Report Authenticated By: Brandon Melnick, M.D.    Brief H and P: For complete details please refer to admission H and P, but in brief This is a 78 year old female who is a resident of nursing home and has dementia. She was brought to the emergency room today after he was noted that she had a change in her mental status. It is unclear as to the exact events that transpired prior to her transfer. There is no family at bedside. I tried both contact numbers and was unable to reach anyone. The patient has dementia and is unable to reliably participate in history. History is obtained from the medical record. Apparently the patient has had poor by mouth intake and worsening mental status for the past 12 days. Also noted that she had questionable edema. She is brought to the ER for evaluation. Patient was found to be drowsy but arousable, further evaluation revealed that she may have a urinary tract infection. Patient has been referred for admission     Hospital Course:  Principal Problem:  *Encephalopathy, likely multifactorial.  Patient was on benzodiazepines which have been held here in the hospital.  Her urine culture was positive for gram negative rods.  She was placed empirically on levaquin and has done well with this.  Sensitivities on the culture are pending, and these will need to be  followed up.  Also, Patient's TSH was found to be elevated at 50, with low free T3.  Her synthroid dose was increased from 100 to .  She will need repeat thyroid function tests in 4-6 weeks. Patient will need assistance with her meals and should be encouraged to keep herself hydrated and have good oral intake.  Speaking to her family member today, she reports that the patient is back to her baseline mental status.  We will plan on transfer back to the nursing home today for continued care.   Active Problems:  Urinary tract infection, on antibiotics, need to follow up culture results, see above.  Dementia, at baseline  Hypothyroidism, uncontrolled, see above  CKD (chronic kidney disease) stage 4, GFR 15-29 ml/min, Her renal function is at her baseline  Hypertension, stable   Time spent on Discharge:  Signed: MEMON,JEHANZEB [facility] Pager: [phone] 09/25/2011, 11:31 AM

## 2011-09-26 LAB — URINE CULTURE

## 2012-01-13 ENCOUNTER — Emergency Department (HOSPITAL_COMMUNITY): Payer: Medicare Other

## 2012-01-13 ENCOUNTER — Encounter (HOSPITAL_COMMUNITY): Payer: Self-pay | Admitting: *Deleted

## 2012-01-13 ENCOUNTER — Emergency Department (HOSPITAL_COMMUNITY)
Admission: EM | Admit: 2012-01-13 | Discharge: 2012-01-14 | Disposition: A | Discharge status: Other Acute Inpatient Hospital | Payer: Medicare Other | Attending: Emergency Medicine | Admitting: Emergency Medicine

## 2012-01-13 DIAGNOSIS — W19XXXA Unspecified fall, initial encounter: Secondary | ICD-10-CM

## 2012-01-13 DIAGNOSIS — E039 Hypothyroidism, unspecified: Secondary | ICD-10-CM | POA: Insufficient documentation

## 2012-01-13 DIAGNOSIS — W050XXA Fall from non-moving wheelchair, initial encounter: Secondary | ICD-10-CM | POA: Insufficient documentation

## 2012-01-13 DIAGNOSIS — I10 Essential (primary) hypertension: Secondary | ICD-10-CM | POA: Insufficient documentation

## 2012-01-13 DIAGNOSIS — S51009A Unspecified open wound of unspecified elbow, initial encounter: Secondary | ICD-10-CM | POA: Insufficient documentation

## 2012-01-13 DIAGNOSIS — Y9210 Unspecified residential institution as the place of occurrence of the external cause: Secondary | ICD-10-CM | POA: Insufficient documentation

## 2012-01-13 DIAGNOSIS — G309 Alzheimer's disease, unspecified: Secondary | ICD-10-CM | POA: Insufficient documentation

## 2012-01-13 DIAGNOSIS — F0280 Dementia in other diseases classified elsewhere without behavioral disturbance: Secondary | ICD-10-CM | POA: Insufficient documentation

## 2012-01-13 DIAGNOSIS — M810 Age-related osteoporosis without current pathological fracture: Secondary | ICD-10-CM | POA: Insufficient documentation

## 2012-01-13 DIAGNOSIS — Z79899 Other long term (current) drug therapy: Secondary | ICD-10-CM | POA: Insufficient documentation

## 2012-01-13 DIAGNOSIS — IMO0002 Reserved for concepts with insufficient information to code with codable children: Secondary | ICD-10-CM

## 2012-01-13 NOTE — ED Notes (Signed)
Pt arrived via ems from [facility]. ems reports pt slid from wheelchair to floor and has skin tear to right elbow.

## 2012-01-14 NOTE — Discharge Instructions (Signed)
The xray does NOT show any broken bones. Elbow required regular wound care.   Laceration Care, Adult A laceration is a cut or lesion that goes through all layers of the skin and into the tissue just beneath the skin. TREATMENT  Some lacerations may not require closure. Some lacerations may not be able to be closed due to an increased risk of infection. It is important to see your caregiver as soon as possible after an injury to minimize the risk of infection and maximize the opportunity for successful closure. If closure is appropriate, pain medicines may be given, if needed. The wound will be cleaned to help prevent infection. Your caregiver will use stitches (sutures), staples, wound glue (adhesive), or skin adhesive strips to repair the laceration. These tools bring the skin edges together to allow for faster healing and a better cosmetic outcome. However, all wounds will heal with a scar. Once the wound has healed, scarring can be minimized by covering the wound with sunscreen during the day for 1 full year. HOME CARE INSTRUCTIONS  For sutures or staples:  Keep the wound clean and dry.   If you were given a bandage (dressing), you should change it at least once a day. Also, change the dressing if it becomes wet or dirty, or as directed by your caregiver.   Wash the wound with soap and water 2 times a day. Rinse the wound off with water to remove all soap. Pat the wound dry with a clean towel.   After cleaning, apply a thin layer of the antibiotic ointment as recommended by your caregiver. This will help prevent infection and keep the dressing from sticking.   You may shower as usual after the first 24 hours. Do not soak the wound in water until the sutures are removed.   Only take over-the-counter or prescription medicines for pain, discomfort, or fever as directed by your caregiver.   Get your sutures or staples removed as directed by your caregiver.  For skin adhesive strips:  Keep  the wound clean and dry.   Do not get the skin adhesive strips wet. You may bathe carefully, using caution to keep the wound dry.   If the wound gets wet, pat it dry with a clean towel.   Skin adhesive strips will fall off on their own. You may trim the strips as the wound heals. Do not remove skin adhesive strips that are still stuck to the wound. They will fall off in time.  For wound adhesive:  You may briefly wet your wound in the shower or bath. Do not soak or scrub the wound. Do not swim. Avoid periods of heavy perspiration until the skin adhesive has fallen off on its own. After showering or bathing, gently pat the wound dry with a clean towel.   Do not apply liquid medicine, cream medicine, or ointment medicine to your wound while the skin adhesive is in place. This may loosen the film before your wound is healed.   If a dressing is placed over the wound, be careful not to apply tape directly over the skin adhesive. This may cause the adhesive to be pulled off before the wound is healed.   Avoid prolonged exposure to sunlight or tanning lamps while the skin adhesive is in place. Exposure to ultraviolet light in the first year will darken the scar.   The skin adhesive will usually remain in place for 5 to 10 days, then naturally fall off the skin. Do not  pick at the adhesive film.  You may need a tetanus shot if:  You cannot remember when you had your last tetanus shot.   You have never had a tetanus shot.  If you get a tetanus shot, your arm may swell, get red, and feel warm to the touch. This is common and not a problem. If you need a tetanus shot and you choose not to have one, there is a rare chance of getting tetanus. Sickness from tetanus can be serious. SEEK MEDICAL CARE IF:   You have redness, swelling, or increasing pain in the wound.   You see a red line that goes away from the wound.   You have yellowish-white fluid (pus) coming from the wound.   You have a fever.     You notice a bad smell coming from the wound or dressing.   Your wound breaks open before or after sutures have been removed.   You notice something coming out of the wound such as wood or glass.   Your wound is on your hand or foot and you cannot move a finger or toe.  SEEK IMMEDIATE MEDICAL CARE IF:   Your pain is not controlled with prescribed medicine.   You have severe swelling around the wound causing pain and numbness or a change in color in your arm, hand, leg, or foot.   Your wound splits open and starts bleeding.   You have worsening numbness, weakness, or loss of function of any joint around or beyond the wound.   You develop painful lumps near the wound or on the skin anywhere on your body.  MAKE SURE YOU:   Understand these instructions.   Will watch your condition.   Will get help right away if you are not doing well or get worse.  Document Released: 08/07/2005 Document Revised: 07/27/2011 Document Reviewed: 01/31/2011 [facility] Patient Information 2012 [place], [place].

## 2012-01-14 NOTE — ED Provider Notes (Signed)
History     CSN: [id number]  Arrival date & time 01/13/12  2214   First MD Initiated Contact with Patient 01/13/12 2314      Chief Complaint  Patient presents with  . Fall    (Consider location/radiation/quality/duration/timing/severity/associated sxs/prior treatment) HPI Level 5 caveat for dementia Martha Pearson is a 78 y.o. female brought in by ambulance, who presents to the Emergency Department complaining of fall from a wheelchair at the assisted living facility sustaining a skin tear to her elbow. No other c/o.  PCP Dr. Margo Aye  Past Medical History  Diagnosis Date  . Hypertension   . Thyroid disease     hypothyroid  . Cancer     breast  . Hypothyroid   . Alzheimer disease   . Osteoporosis     Past Surgical History  Procedure Date  . Mastectomy     right    History reviewed. No pertinent family history.  History  Substance Use Topics  . Smoking status: Never Smoker   . Smokeless tobacco: Never Used  . Alcohol Use: No    OB History    Grav Para Term Preterm Abortions TAB SAB Ect Mult Living                  Review of Systems  Constitutional: Negative for fever.       10 Systems reviewed and are negative for acute change except as noted in the HPI.  HENT: Negative for congestion.   Eyes: Negative for discharge and redness.  Respiratory: Negative for cough and shortness of breath.   Cardiovascular: Negative for chest pain.  Gastrointestinal: Negative for vomiting and abdominal pain.  Musculoskeletal: Negative for back pain.       Elbow pain  Skin: Negative for rash.  Neurological: Negative for syncope, numbness and headaches.  Psychiatric/Behavioral:       No behavior change.    Allergies  Ace inhibitors; Penicillins; and Sulfa antibiotics  Home Medications   Current Outpatient Rx  Name Route Sig Dispense Refill  . AMLODIPINE BESYLATE 10 MG PO TABS Oral Take 10 mg by mouth daily.      . ASPIRIN 81 MG PO TBEC Oral Take 81 mg by mouth daily.       Marland Kitchen CALCIUM CARBONATE 600 MG PO TABS Oral Take 600 mg by mouth 3 (three) times daily.      Marland Kitchen DOCUSATE SODIUM 100 MG PO CAPS Oral Take 100 mg by mouth 2 (two) times daily.      Marland Kitchen LABETALOL HCL 200 MG PO TABS Oral Take 200 mg by mouth 3 (three) times daily.      Marland Kitchen LEVOTHYROXINE SODIUM 150 MCG PO TABS Oral Take 1 tablet (150 mcg total) by mouth daily. 30 tablet 0  . MEMANTINE HCL 10 MG PO TABS Oral Take 10 mg by mouth 2 (two) times daily.      Marland Kitchen MIRTAZAPINE 30 MG PO TABS Oral Take 30 mg by mouth at bedtime.      . ENSURE PO Oral Take 237 mLs by mouth 3 (three) times daily.     Marland Kitchen OMEPRAZOLE 20 MG PO CPDR Oral Take 20 mg by mouth daily.    Marland Kitchen RIVASTIGMINE 4.6 MG/24HR TD PT24 Transdermal Place 1 patch onto the skin daily.      . SERTRALINE HCL 25 MG PO TABS Oral Take 25 mg by mouth daily.      Marland Kitchen VITAMIN D (CHOLECALCIFEROL) PO Oral Take 1,000 mg by mouth 2 (two)  times daily.      Marland Kitchen ONDANSETRON 4 MG PO TBDP Oral Take 4 mg by mouth every 8 (eight) hours as needed. nausea    . POLYETHYLENE GLYCOL 3350 PO PACK Oral Take 17 g by mouth daily as needed. constipation    . PROMETHAZINE HCL 25 MG PO TABS Oral Take 25 mg by mouth every 6 (six) hours as needed. nausea      BP 199/98  Pulse 81  Temp(Src) 98.6 F (37 C) (Oral)  Resp 16  Wt 110 lb (49.896 kg)  SpO2 95%  Physical Exam  Nursing note and vitals reviewed. Constitutional:       Awake, alert, nontoxic appearance.  HENT:  Head: Atraumatic.  Eyes: Right eye exhibits no discharge. Left eye exhibits no discharge.  Neck: Neck supple.  Pulmonary/Chest: Effort normal. She exhibits no tenderness.  Abdominal: Soft. There is no tenderness. There is no rebound.  Musculoskeletal: She exhibits no tenderness.       Baseline ROM, no obvious new focal weakness.  Neurological:       Mental status and motor strength appears baseline for patient and situation.  Skin: Skin is warm. No rash noted.       Skin tear laceration to right elbow. No sutures  required.  Psychiatric: She has a normal mood and affect.    ED Course  Procedures (including critical care time)  Labs Reviewed - No data to display Dg Elbow Complete Right  01/13/2012  *RADIOLOGY REPORT*  Clinical Data: Fall.  Elbow injury and pain.  RIGHT ELBOW - COMPLETE 3+ VIEW  Comparison:  None.  Findings:  There is no evidence of fracture, dislocation, or joint effusion.  There is no evidence of arthropathy or other focal bone abnormality.  Soft tissues are unremarkable. Generalized osteopenia is noted.  IMPRESSION:  No acute findings.  Osteopenia.  Original Report Authenticated By: Danae Orleans, M.D.       MDM  Nursing home patient who fell from the wheelchair sustaining a laceration to her right elbow. X-ray reveals no bony abnormality. Laceration is a skin tear that does not require suturing. It was redressed.Pt stable in ED with no significant deterioration in condition.The patient appears reasonably screened and/or stabilized for discharge and I doubt any other medical condition or other [facility] requiring further screening, evaluation, or treatment in the ED at this time prior to discharge.  MDM Reviewed: nursing note and vitals Interpretation: x-ray           Nicoletta Dress. Colon Branch, MD 01/14/12 1610

## 2012-03-20 ENCOUNTER — Emergency Department (HOSPITAL_COMMUNITY)
Admission: EM | Admit: 2012-03-20 | Discharge: 2012-03-20 | Disposition: A | Discharge status: Home / Self Care | Payer: Medicare Other | Attending: Emergency Medicine | Admitting: Emergency Medicine

## 2012-03-20 ENCOUNTER — Encounter (HOSPITAL_COMMUNITY): Payer: Self-pay

## 2012-03-20 ENCOUNTER — Emergency Department (HOSPITAL_COMMUNITY): Payer: Medicare Other

## 2012-03-20 DIAGNOSIS — F0280 Dementia in other diseases classified elsewhere without behavioral disturbance: Secondary | ICD-10-CM | POA: Insufficient documentation

## 2012-03-20 DIAGNOSIS — Z008 Encounter for other general examination: Secondary | ICD-10-CM | POA: Insufficient documentation

## 2012-03-20 DIAGNOSIS — S32599A Other specified fracture of unspecified pubis, initial encounter for closed fracture: Secondary | ICD-10-CM

## 2012-03-20 DIAGNOSIS — Z79899 Other long term (current) drug therapy: Secondary | ICD-10-CM | POA: Insufficient documentation

## 2012-03-20 DIAGNOSIS — M810 Age-related osteoporosis without current pathological fracture: Secondary | ICD-10-CM | POA: Insufficient documentation

## 2012-03-20 DIAGNOSIS — G309 Alzheimer's disease, unspecified: Secondary | ICD-10-CM | POA: Insufficient documentation

## 2012-03-20 DIAGNOSIS — I10 Essential (primary) hypertension: Secondary | ICD-10-CM | POA: Insufficient documentation

## 2012-03-20 DIAGNOSIS — E039 Hypothyroidism, unspecified: Secondary | ICD-10-CM | POA: Insufficient documentation

## 2012-03-20 DIAGNOSIS — Z853 Personal history of malignant neoplasm of breast: Secondary | ICD-10-CM | POA: Insufficient documentation

## 2012-03-20 MED ORDER — OXYCODONE-ACETAMINOPHEN 5-325 MG PO TABS
1.0000 | ORAL_TABLET | Freq: Once | ORAL | Status: AC
  Administered 2012-03-20: 1 via ORAL
  Filled 2012-03-20: qty 1

## 2012-03-20 MED ORDER — OXYCODONE-ACETAMINOPHEN 5-325 MG PO TABS
1.0000 | ORAL_TABLET | ORAL | Status: AC | PRN
Start: 2012-03-20 — End: 2012-03-30

## 2012-03-20 NOTE — ED Provider Notes (Signed)
History   This chart was scribed for Joya Gaskins, MD by Charolett Bumpers . The patient was seen in room APA05/APA05. Patient's care was started at 1212.    CSN: [id number]  Arrival date & time 03/20/12  1151   First MD Initiated Contact with Patient 03/20/12 1212      Chief Complaint  Patient presents with  . Fall    HPI Martha Pearson is a 78 y.o. female who presents to the Emergency Department via EMS after falling PTA. Cousin reports that the pt has an unwitnessed fall at [facility]. EMS found pt laying in left lateral recumbent position. Cousin denies any hitting of head or LOC. Pt denies any other injuries or complaints of pain upon arrival. Pt states that she tripped and fell this morning when she stood up. Pt denies any weakness or dizziness. Cousin reports that the pt is baseline. Pt has a h/o Alzheimer disease.    Past Medical History  Diagnosis Date  . Hypertension   . Thyroid disease     hypothyroid  . Cancer     breast  . Hypothyroid   . Alzheimer disease   . Osteoporosis     Past Surgical History  Procedure Date  . Mastectomy     right    No family history on file.  History  Substance Use Topics  . Smoking status: Never Smoker   . Smokeless tobacco: Never Used  . Alcohol Use: No    OB History    Grav Para Term Preterm Abortions TAB SAB Ect Mult Living                  Review of Systems  Unable to perform ROS: Dementia  HENT: Negative for neck pain.   Respiratory: Negative for shortness of breath.   Cardiovascular: Negative for chest pain.  Gastrointestinal: Negative for vomiting and abdominal pain.  Musculoskeletal: Positive for arthralgias. Negative for back pain.  Neurological: Negative for dizziness, syncope, weakness, numbness and headaches.    Allergies  Ace inhibitors; Penicillins; and Sulfa antibiotics  Home Medications   Current Outpatient Rx  Name Route Sig Dispense Refill  . AMLODIPINE BESYLATE 10 MG PO TABS  Oral Take 10 mg by mouth daily.      . ASPIRIN 81 MG PO TBEC Oral Take 81 mg by mouth daily.      Marland Kitchen CALCIUM CARBONATE 600 MG PO TABS Oral Take 600 mg by mouth 3 (three) times daily.      Marland Kitchen DOCUSATE SODIUM 100 MG PO CAPS Oral Take 100 mg by mouth 2 (two) times daily.      Marland Kitchen LABETALOL HCL 200 MG PO TABS Oral Take 200 mg by mouth 3 (three) times daily.      Marland Kitchen LEVOTHYROXINE SODIUM 150 MCG PO TABS Oral Take 1 tablet (150 mcg total) by mouth daily. 30 tablet 0  . MEMANTINE HCL 10 MG PO TABS Oral Take 10 mg by mouth 2 (two) times daily.      Marland Kitchen MIRTAZAPINE 30 MG PO TABS Oral Take 30 mg by mouth at bedtime.      . ENSURE PO Oral Take 237 mLs by mouth 3 (three) times daily.     Marland Kitchen OMEPRAZOLE 20 MG PO CPDR Oral Take 20 mg by mouth daily.    Marland Kitchen ONDANSETRON 4 MG PO TBDP Oral Take 4 mg by mouth every 8 (eight) hours as needed. nausea    . POLYETHYLENE GLYCOL 3350 PO PACK Oral Take  17 g by mouth daily as needed. constipation    . PROMETHAZINE HCL 25 MG PO TABS Oral Take 25 mg by mouth every 6 (six) hours as needed. nausea    . RIVASTIGMINE 4.6 MG/24HR TD PT24 Transdermal Place 1 patch onto the skin daily.      . SERTRALINE HCL 25 MG PO TABS Oral Take 25 mg by mouth daily.      Marland Kitchen VITAMIN D (CHOLECALCIFEROL) PO Oral Take 1,000 mg by mouth 2 (two) times daily.        BP 111/85  Pulse 65  Temp 98.5 F (36.9 C) (Oral)  Resp 20  Ht 5' (1.524 m)  Wt 110 lb (49.896 kg)  BMI 21.48 kg/m2  SpO2 94%  Physical Exam CONSTITUTIONAL: Well developed/well nourished HEAD AND FACE: Normocephalic/atraumatic EYES: EOMI/PERRL ENMT: Mucous membranes moist, No evidence of facial/nasal trauma NECK: supple no meningeal signs SPINE:entire spine nontender CV: S1/S2 noted, no murmurs/rubs/gallops noted LUNGS: Lungs are clear to auscultation bilaterally, no apparent distress ABDOMEN: soft, nontender, no rebound or guarding GU:no cva tenderness NEURO: Pt is awake/alert, moves all extremitiesx4 EXTREMITIES: pulses normal,  full ROM, mild tenderness on ROM of left hip but no deformity and no bruising noted SKIN: warm, color normal PSYCH: no abnormalities of mood noted  ED Course  Procedures (  DIAGNOSTIC STUDIES: Oxygen Saturation is 94% on room air, adequate by my interpretation.    COORDINATION OF CARE:  12:43-Discussed planned course of treatment with the patient and family member, who is agreeable at this time.   3:07 PM Possible new pubic ramus fracture, but already has chronic deformity in that region She has no signs of femur fracture has full ROM of left hip She is nonambulatory at baseline at nursing facility.  Advised to keep NWB and f/u with ortho No other signs of traumatic injury to head/neck/chest or other extremities     MDM  Nursing notes including past medical history and social history reviewed and considered in documentation xrays reviewed and considered    Date: 03/20/2012  Rate: 70  Rhythm: normal sinus rhythm  QRS Axis: normal  Intervals: normal  ST/T Wave abnormalities: nonspecific ST changes  Conduction Disutrbances:right bundle branch block      I personally performed the services described in this documentation, which was scribed in my presence. The recorded information has been reviewed and considered.          Joya Gaskins, MD 03/20/12 [phone]

## 2012-03-20 NOTE — ED Notes (Signed)
Pt from [facility] for fall. EMS found pt lying in left lateral recumbent position. Pt removed from floor PMS intact denies pain. No signs of head injury denies any pain at present. No deformities noted. CBG 106

## 2012-03-29 ENCOUNTER — Emergency Department (HOSPITAL_COMMUNITY)
Admission: EM | Admit: 2012-03-29 | Discharge: 2012-03-30 | Disposition: A | Discharge status: Skilled Nursing Facility | Payer: Medicare Other | Attending: Emergency Medicine | Admitting: Emergency Medicine

## 2012-03-29 ENCOUNTER — Emergency Department (HOSPITAL_COMMUNITY): Payer: Medicare Other

## 2012-03-29 ENCOUNTER — Encounter (HOSPITAL_COMMUNITY): Payer: Self-pay | Admitting: *Deleted

## 2012-03-29 DIAGNOSIS — Z79899 Other long term (current) drug therapy: Secondary | ICD-10-CM | POA: Insufficient documentation

## 2012-03-29 DIAGNOSIS — E079 Disorder of thyroid, unspecified: Secondary | ICD-10-CM | POA: Insufficient documentation

## 2012-03-29 DIAGNOSIS — F0390 Unspecified dementia without behavioral disturbance: Secondary | ICD-10-CM | POA: Insufficient documentation

## 2012-03-29 DIAGNOSIS — W06XXXA Fall from bed, initial encounter: Secondary | ICD-10-CM | POA: Insufficient documentation

## 2012-03-29 DIAGNOSIS — Y9210 Unspecified residential institution as the place of occurrence of the external cause: Secondary | ICD-10-CM | POA: Insufficient documentation

## 2012-03-29 DIAGNOSIS — G309 Alzheimer's disease, unspecified: Secondary | ICD-10-CM | POA: Insufficient documentation

## 2012-03-29 DIAGNOSIS — W19XXXA Unspecified fall, initial encounter: Secondary | ICD-10-CM

## 2012-03-29 DIAGNOSIS — M810 Age-related osteoporosis without current pathological fracture: Secondary | ICD-10-CM | POA: Insufficient documentation

## 2012-03-29 DIAGNOSIS — I10 Essential (primary) hypertension: Secondary | ICD-10-CM | POA: Insufficient documentation

## 2012-03-29 DIAGNOSIS — R109 Unspecified abdominal pain: Secondary | ICD-10-CM | POA: Insufficient documentation

## 2012-03-29 DIAGNOSIS — Z853 Personal history of malignant neoplasm of breast: Secondary | ICD-10-CM | POA: Insufficient documentation

## 2012-03-29 DIAGNOSIS — F0280 Dementia in other diseases classified elsewhere without behavioral disturbance: Secondary | ICD-10-CM | POA: Insufficient documentation

## 2012-03-29 NOTE — ED Provider Notes (Signed)
History   This chart was scribed for Glynn Octave, MD by Melba Coon. The patient was seen in room APA10/APA10 and the patient's care was started at 11:26PM.    CSN: [id number]  Arrival date & time 03/29/12  2215   None     Chief Complaint  Patient presents with  . Fall    (Consider location/radiation/quality/duration/timing/severity/associated sxs/prior Treatment) A Level 5 Caveat applies due to the condition of the patient (dementia). The history is provided by the patient and a relative (daughter). The history is limited by the condition of the patient (dementia). No language interpreter was used.   Martha Pearson is a 78 y.o. female who presents to the Emergency Department with a C-collar complaining of constant, moderate to severe pelvic pain pertaining to a non-witnessed fall with unknown head contact or LOC with an onset tonight. Pt was found lying on the floor next to her bed. [facility] states that she may have fell on her shoulder. Daughter states that she fractured her pelvis bone a week ago, so pain was already present. Pt usually uses a wheelchair but can walk. Percocet and Tylenol alleviate the pain. No HA, fever, neck pain, sore throat, rash, back pain, CP, SOB, abd pain, n/v/d, dysuria, or extremity pain, edema, weakness, numbness, or tingling. Pt takes ASA, BP meds, and thyroid meds. Allergic to Ace inhibitors; Penicillins; and Sulfa antibiotics. No other pertinent medical symptoms.  Past Medical History  Diagnosis Date  . Hypertension   . Thyroid disease     hypothyroid  . Cancer     breast  . Hypothyroid   . Alzheimer disease   . Osteoporosis     Past Surgical History  Procedure Date  . Mastectomy     right    History reviewed. No pertinent family history.  History  Substance Use Topics  . Smoking status: Never Smoker   . Smokeless tobacco: Never Used  . Alcohol Use: No    OB History    Grav Para Term Preterm Abortions TAB SAB Ect Mult  Living                  Review of Systems 10 Systems reviewed and all are negative for acute change except as noted in the HPI.   Allergies  Ace inhibitors; Penicillins; and Sulfa antibiotics  Home Medications   Current Outpatient Rx  Name Route Sig Dispense Refill  . AMLODIPINE BESYLATE 10 MG PO TABS Oral Take 10 mg by mouth daily.      . ASPIRIN 81 MG PO TBEC Oral Take 81 mg by mouth daily.      Marland Kitchen CALCIUM CARBONATE 600 MG PO TABS Oral Take 600 mg by mouth 3 (three) times daily.      Marland Kitchen VITAMIN D 1000 UNITS PO TABS Oral Take 1,000 Units by mouth 2 (two) times daily.    Marland Kitchen LABETALOL HCL 200 MG PO TABS Oral Take 200 mg by mouth 3 (three) times daily.      Marland Kitchen LEVOTHYROXINE SODIUM 175 MCG PO TABS Oral Take 175 mcg by mouth daily.    Marland Kitchen MEMANTINE HCL 10 MG PO TABS Oral Take 10 mg by mouth 2 (two) times daily.      Marland Kitchen MIRTAZAPINE 30 MG PO TABS Oral Take 30 mg by mouth at bedtime.      Marland Kitchen NITROFURANTOIN MACROCRYSTAL 50 MG PO CAPS Oral Take 50 mg by mouth 2 (two) times daily. For 7 days    . ENSURE PO  Oral Take 237 mLs by mouth 3 (three) times daily.     Marland Kitchen OMEPRAZOLE 20 MG PO CPDR Oral Take 20 mg by mouth daily.    . OXYCODONE-ACETAMINOPHEN 5-325 MG PO TABS Oral Take 1 tablet by mouth every 4 (four) hours as needed for pain. 10 tablet 0  . POLYETHYLENE GLYCOL 3350 PO PACK Oral Take 17 g by mouth daily as needed. constipation    . RIVASTIGMINE 4.6 MG/24HR TD PT24 Transdermal Place 1 patch onto the skin daily.      . SERTRALINE HCL 25 MG PO TABS Oral Take 25 mg by mouth daily.      Marland Kitchen ZINC OXIDE 11.3 % EX CREA Topical Apply 1 application topically 3 (three) times daily. For redness on bottom for 2 weeks    . OXYCODONE-ACETAMINOPHEN 5-325 MG PO TABS Oral Take 2 tablets by mouth every 4 (four) hours as needed for pain. 15 tablet 0    BP 162/81  Pulse 81  Temp 98.1 F (36.7 C) (Oral)  Resp 17  SpO2 96%  Physical Exam  Nursing note and vitals reviewed. Constitutional: She appears  well-developed and well-nourished. No distress.  HENT:  Head: Normocephalic and atraumatic.  Eyes: EOM are normal.  Neck: Normal range of motion. No tracheal deviation present.       Midline C-spine tenderness  Cardiovascular: Normal rate, regular rhythm and normal heart sounds.   No murmur heard. Pulmonary/Chest: Effort normal and breath sounds normal. No respiratory distress. She has no wheezes.  Abdominal: Soft. There is no tenderness.  Musculoskeletal: Normal range of motion. She exhibits tenderness (TTP iliac crest bilaterally). She exhibits no edema.       No T or L-spine tenderness; moving extremities with nml ROM.  Neurological: She is alert. No cranial nerve deficit.  Skin: Skin is warm and dry. No rash noted.  Psychiatric: She has a normal mood and affect. Her behavior is normal.       Follow commands.    ED Course  Procedures (including critical care time)  DIAGNOSTIC STUDIES: Oxygen Saturation is 96% on room air, adequate by my interpretation.    COORDINATION OF CARE:  11:30PM - head CT, C-spine CT, CXR, and pelvic XR will be ordered for the pt.   Labs Reviewed - No data to display Dg Chest 1 View  03/30/2012  *RADIOLOGY REPORT*  Clinical Data: Fall, confusion  CHEST - 1 VIEW  Comparison: Chest radiograph 09/23/2011  Findings: Stable cardiac silhouette.  Lungs hyperinflated.  There is chronic bronchitic markings centrally.  Chronic changes in the left shoulder.  IMPRESSION: No change from prior.  No acute findings.  Original Report Authenticated By: Genevive Bi, M.D.   Dg Pelvis 1-2 Views  03/30/2012  *RADIOLOGY REPORT*  Clinical Data: Fall, confusion.  PELVIS - 1-2 VIEW  Comparison: 03/20/2012  Findings: Bilateral femoral hardware.  Diffuse osteopenia. Bilateral pubic rami, left superior puboacetabular junction, and left-sided parasymphyseal fractures are similar to prior.  Pubic symphysis DJD.  Sacrum is obscured by overlying bowel.  IMPRESSION: Bilateral pubic  rami and left parasymphyseal fractures are similar to prior.  Original Report Authenticated By: Waneta Martins, M.D.   Ct Head Wo Contrast  03/30/2012  *RADIOLOGY REPORT*  Clinical Data:  Fall, dimension.  CT HEAD WITHOUT CONTRAST CT CERVICAL SPINE WITHOUT CONTRAST  Technique:  Multidetector CT imaging of the head and cervical spine was performed following the standard protocol without intravenous contrast.  Multiplanar CT image reconstructions of the cervical spine were also  generated.  Comparison:  09/23/2011 head CT, 05/27/2011 head and cervical spine CTs  CT HEAD  Findings: Prominence of the sulci, cisterns, and ventricles, in keeping with volume loss. There are subcortical and periventricular white matter hypodensities, a nonspecific finding most often seen with chronic microangiopathic changes.  There is no evidence for acute hemorrhage, overt hydrocephalus, mass lesion, or abnormal extra-axial fluid collection.  No definite CT evidence for acute cortical based (large artery) infarction. There are about the right to remote left basal ganglia lacunar infarctions.  Had a chronic right maxillary sinus opacity and small  IMPRESSION: Volume loss white matter changes.  Remote left basal ganglia lacunar infarctions.  No definite acute intracranial abnormality.  CT CERVICAL SPINE  Findings: Biapical scarring.  Postoperative changes at C1-2 are similar to prior.  Multilevel degenerative changes.  Multilevel disc osteophyte complexes resulting central canal narrowing, most pronounced at C5-6, similar to prior.  Maintained craniocervical relationship.  No overt evidence for or complication.  No acute fracture or dislocation identified. Rounded lucent foci scattered throughout vertebrae without cortical disruption are similar to prior. Paravertebral soft tissues within normal limits.  IMPRESSION: Multilevel degenerative changes.  Postoperative changes at C1-2. No acute fracture or dislocation identified.  Multiple  lucent foci scattered throughout the vertebrae. Favored to reflect advanced osteopenia though an infiltrative marrow process such as myeloma is not excluded.  The appearance is similar to the prior.  Original Report Authenticated By: Waneta Martins, M.D.   Ct Cervical Spine Wo Contrast  03/30/2012  *RADIOLOGY REPORT*  Clinical Data:  Fall, dimension.  CT HEAD WITHOUT CONTRAST CT CERVICAL SPINE WITHOUT CONTRAST  Technique:  Multidetector CT imaging of the head and cervical spine was performed following the standard protocol without intravenous contrast.  Multiplanar CT image reconstructions of the cervical spine were also generated.  Comparison:  09/23/2011 head CT, 05/27/2011 head and cervical spine CTs  CT HEAD  Findings: Prominence of the sulci, cisterns, and ventricles, in keeping with volume loss. There are subcortical and periventricular white matter hypodensities, a nonspecific finding most often seen with chronic microangiopathic changes.  There is no evidence for acute hemorrhage, overt hydrocephalus, mass lesion, or abnormal extra-axial fluid collection.  No definite CT evidence for acute cortical based (large artery) infarction. There are about the right to remote left basal ganglia lacunar infarctions.  Had a chronic right maxillary sinus opacity and small  IMPRESSION: Volume loss white matter changes.  Remote left basal ganglia lacunar infarctions.  No definite acute intracranial abnormality.  CT CERVICAL SPINE  Findings: Biapical scarring.  Postoperative changes at C1-2 are similar to prior.  Multilevel degenerative changes.  Multilevel disc osteophyte complexes resulting central canal narrowing, most pronounced at C5-6, similar to prior.  Maintained craniocervical relationship.  No overt evidence for or complication.  No acute fracture or dislocation identified. Rounded lucent foci scattered throughout vertebrae without cortical disruption are similar to prior. Paravertebral soft tissues within  normal limits.  IMPRESSION: Multilevel degenerative changes.  Postoperative changes at C1-2. No acute fracture or dislocation identified.  Multiple lucent foci scattered throughout the vertebrae. Favored to reflect advanced osteopenia though an infiltrative marrow process such as myeloma is not excluded.  The appearance is similar to the prior.  Original Report Authenticated By: Waneta Martins, M.D.     1. Multiple pelvic fractures   2. Fall       MDM  Unwitnessed fall from bed with recent pelvic fracture. No loss of consciousness. No neurological deficits at mental baseline  per family member.  Pelvic fractures similar to previous. Patient is wheelchair-bound at baseline. She is advised to remain nonweightbearing and followup with orthopedics.  Patient's cousin and caregiver informed of findings. Informed of remote possibility of myeloma but more likely osteopenia. Family declines admission as patient has been doing well at nursing home with tylenol.  Ortho followup discussed.   Date: 03/29/2012  Rate: 78  Rhythm: normal sinus rhythm  QRS Axis: left  Intervals: normal  ST/T Wave abnormalities: nonspecific ST/T changes  Conduction Disutrbances:right bundle branch block  Narrative Interpretation:   Old EKG Reviewed: unchanged   I personally performed the services described in this documentation, which was scribed in my presence.  The recorded information has been reviewed and considered.       Glynn Octave, MD 03/30/12 [phone]

## 2012-03-29 NOTE — ED Notes (Signed)
Pt to department via EMS from [facility].  Per report, pt was found by facility staff sitting on floor next to her bed.  Pt denied pain at present.  Pt does have history of dementia, so unable to verify information with pt.

## 2012-03-30 MED ORDER — OXYCODONE-ACETAMINOPHEN 5-325 MG PO TABS
2.0000 | ORAL_TABLET | ORAL | Status: AC | PRN
Start: 2012-03-30 — End: 2012-04-09

## 2012-04-05 ENCOUNTER — Emergency Department (HOSPITAL_COMMUNITY): Payer: Medicare Other

## 2012-04-05 ENCOUNTER — Emergency Department (HOSPITAL_COMMUNITY)
Admission: EM | Admit: 2012-04-05 | Discharge: 2012-04-06 | Disposition: A | Discharge status: Home / Self Care | Payer: Medicare Other | Attending: Emergency Medicine | Admitting: Emergency Medicine

## 2012-04-05 ENCOUNTER — Encounter (HOSPITAL_COMMUNITY): Payer: Self-pay | Admitting: *Deleted

## 2012-04-05 DIAGNOSIS — R109 Unspecified abdominal pain: Secondary | ICD-10-CM | POA: Insufficient documentation

## 2012-04-05 DIAGNOSIS — R197 Diarrhea, unspecified: Secondary | ICD-10-CM

## 2012-04-05 DIAGNOSIS — I10 Essential (primary) hypertension: Secondary | ICD-10-CM | POA: Insufficient documentation

## 2012-04-05 DIAGNOSIS — Z853 Personal history of malignant neoplasm of breast: Secondary | ICD-10-CM | POA: Insufficient documentation

## 2012-04-05 DIAGNOSIS — M810 Age-related osteoporosis without current pathological fracture: Secondary | ICD-10-CM | POA: Insufficient documentation

## 2012-04-05 DIAGNOSIS — Z79899 Other long term (current) drug therapy: Secondary | ICD-10-CM | POA: Insufficient documentation

## 2012-04-05 DIAGNOSIS — K625 Hemorrhage of anus and rectum: Secondary | ICD-10-CM | POA: Insufficient documentation

## 2012-04-05 DIAGNOSIS — F0280 Dementia in other diseases classified elsewhere without behavioral disturbance: Secondary | ICD-10-CM | POA: Insufficient documentation

## 2012-04-05 DIAGNOSIS — E079 Disorder of thyroid, unspecified: Secondary | ICD-10-CM | POA: Insufficient documentation

## 2012-04-05 DIAGNOSIS — G309 Alzheimer's disease, unspecified: Secondary | ICD-10-CM | POA: Insufficient documentation

## 2012-04-05 LAB — CBC
MCH: 27.3 pg (ref 26.0–34.0)
MCHC: 31.6 g/dL (ref 30.0–36.0)
MCV: 86.5 fL (ref 78.0–100.0)
Platelets: 361 10*3/uL (ref 150–400)
RDW: 14.1 % (ref 11.5–15.5)

## 2012-04-05 LAB — COMPREHENSIVE METABOLIC PANEL
AST: 13 U/L (ref 0–37)
Albumin: 3.1 g/dL — ABNORMAL LOW (ref 3.5–5.2)
CO2: 26 mEq/L (ref 19–32)
Calcium: 9.6 mg/dL (ref 8.4–10.5)
Creatinine, Ser: 1.78 mg/dL — ABNORMAL HIGH (ref 0.50–1.10)
GFR calc non Af Amer: 24 mL/min — ABNORMAL LOW (ref >90)
Total Protein: 6.7 g/dL (ref 6.0–8.3)

## 2012-04-05 LAB — POCT I-STAT, CHEM 8
Calcium, Ion: 1.24 mmol/L (ref 1.13–1.30)
Creatinine, Ser: 1.8 mg/dL — ABNORMAL HIGH (ref 0.50–1.10)
Glucose, Bld: 106 mg/dL — ABNORMAL HIGH (ref 70–99)
Hemoglobin: 11.6 g/dL — ABNORMAL LOW (ref 12.0–15.0)
Potassium: 4.2 mEq/L (ref 3.5–5.1)

## 2012-04-05 MED ORDER — SODIUM CHLORIDE 0.9 % IV SOLN
INTRAVENOUS | Status: DC
  Administered 2012-04-06: 02:00:00 via INTRAVENOUS

## 2012-04-05 NOTE — ED Notes (Signed)
Pt is from [facility]. EMS brought pt in because pt has had diarrhea x 3 days and rectal bleeding. Pt does have a previous hip fracture.

## 2012-04-06 NOTE — ED Notes (Signed)
Pt has not had any bowel movement since arrival to ED.

## 2012-04-06 NOTE — ED Notes (Signed)
EDP in with pt 

## 2012-04-06 NOTE — ED Provider Notes (Signed)
History     CSN: [id number]  Arrival date & time 04/05/12  2235   First MD Initiated Contact with Patient 04/05/12 2252      Chief Complaint  Patient presents with  . Diarrhea  . Rectal Bleeding    (Consider location/radiation/quality/duration/timing/severity/associated sxs/prior treatment) HPI History provided by nursing home, patient and family bedside. Has been having diarrhea for last 2 days with multiple loose stools. Family has not noticed any rectal bleeding and was present tonight and patient was having bowel movements prior to arrival. Patient has intermittent pains all the time but no significant abdominal pain tonight. No recent antibiotics. No fevers. No vomiting. No known sick contacts. No history of C. Difficile. No recent travel. No known bad food exposures. Patient was seen here last week with a pelvic fracture and has been taking Percocet intermittently. Patient denies any pain. Moderate severity. Past Medical History  Diagnosis Date  . Hypertension   . Thyroid disease     hypothyroid  . Cancer     breast  . Hypothyroid   . Alzheimer disease   . Osteoporosis     Past Surgical History  Procedure Date  . Mastectomy     right    No family history on file.  History  Substance Use Topics  . Smoking status: Never Smoker   . Smokeless tobacco: Never Used  . Alcohol Use: No    OB History    Grav Para Term Preterm Abortions TAB SAB Ect Mult Living                  Review of Systems  Constitutional: Negative for fever and chills.  HENT: Negative for neck pain and neck stiffness.   Eyes: Negative for pain.  Respiratory: Negative for shortness of breath.   Cardiovascular: Negative for chest pain.  Gastrointestinal: Positive for diarrhea. Negative for blood in stool.  Genitourinary: Negative for dysuria.  Musculoskeletal: Negative for back pain.  Skin: Negative for rash.  Neurological: Negative for headaches.  All other systems reviewed and are  negative.    Allergies  Ace inhibitors; Penicillins; and Sulfa antibiotics  Home Medications   Current Outpatient Rx  Name Route Sig Dispense Refill  . ACETAMINOPHEN ER 650 MG PO TBCR Oral Take 650 mg by mouth every 8 (eight) hours as needed.    Marland Kitchen AMLODIPINE BESYLATE 10 MG PO TABS Oral Take 10 mg by mouth daily.      . ASPIRIN 81 MG PO TBEC Oral Take 81 mg by mouth daily.      Marland Kitchen CALCIUM CARBONATE 600 MG PO TABS Oral Take 600 mg by mouth 3 (three) times daily.      Marland Kitchen VITAMIN D 1000 UNITS PO TABS Oral Take 1,000 Units by mouth 2 (two) times daily.    Marland Kitchen LABETALOL HCL 200 MG PO TABS Oral Take 200 mg by mouth 3 (three) times daily.      Marland Kitchen LEVOTHYROXINE SODIUM 175 MCG PO TABS Oral Take 175 mcg by mouth daily.    Marland Kitchen LOPERAMIDE HCL 2 MG PO CAPS Oral Take 2 mg by mouth as needed.    Marland Kitchen MEMANTINE HCL 10 MG PO TABS Oral Take 10 mg by mouth 2 (two) times daily.      Marland Kitchen MIRTAZAPINE 30 MG PO TABS Oral Take 30 mg by mouth at bedtime.      . ENSURE PO Oral Take 237 mLs by mouth 3 (three) times daily.     Marland Kitchen OMEPRAZOLE 20 MG  PO CPDR Oral Take 20 mg by mouth daily.    Marland Kitchen SKIN PREP WIPES MISC Does not apply 1 application by Does not apply route 2 (two) times daily. TO BOTH HEELS    . OXYCODONE-ACETAMINOPHEN 5-325 MG PO TABS Oral Take 2 tablets by mouth every 4 (four) hours as needed for pain. 15 tablet 0  . RIVASTIGMINE 4.6 MG/24HR TD PT24 Transdermal Place 1 patch onto the skin daily.      . SERTRALINE HCL 25 MG PO TABS Oral Take 25 mg by mouth daily.      Marland Kitchen ZINC OXIDE 11.3 % EX CREA Topical Apply 1 application topically 3 (three) times daily. For redness on bottom for 2 weeks    . POLYETHYLENE GLYCOL 3350 PO PACK Oral Take 17 g by mouth daily as needed. constipation      BP 157/75  Pulse 86  Temp 98.9 F (37.2 C) (Oral)  Resp 20  SpO2 96%  Physical Exam  Constitutional: She appears well-developed and well-nourished.  HENT:  Head: Normocephalic and atraumatic.       Mildly dry mucous membranes    Eyes: Conjunctivae and EOM are normal. Pupils are equal, round, and reactive to light.  Neck: Trachea normal. Neck supple. No thyromegaly present.  Cardiovascular: Normal rate, regular rhythm, S1 normal, S2 normal and normal pulses.     No systolic murmur is present   No diastolic murmur is present  Pulses:      Radial pulses are 2+ on the right side, and 2+ on the left side.  Pulmonary/Chest: Effort normal and breath sounds normal. She has no wheezes. She has no rhonchi. She has no rales. She exhibits no tenderness.  Abdominal: Soft. Normal appearance and bowel sounds are normal. She exhibits no distension and no mass. There is no tenderness. There is no rebound, no guarding, no CVA tenderness and negative Murphy's sign.  Musculoskeletal: Normal range of motion. She exhibits no edema and no tenderness.  Neurological: She is alert. She has normal strength. No cranial nerve deficit or sensory deficit. GCS eye subscore is 4. GCS verbal subscore is 5. GCS motor subscore is 6.  Skin: Skin is warm and dry. No rash noted. She is not diaphoretic.  Psychiatric: Her speech is normal.       Cooperative and appropriate    ED Course  Procedures (including critical care time)  Results for orders placed during the hospital encounter of 04/05/12  CBC      Component Value Range   WBC 12.6 (*) 4.0 - 10.5 K/uL   RBC 4.06  3.87 - 5.11 MIL/uL   Hemoglobin 11.1 (*) 12.0 - 15.0 g/dL   HCT 16.1 (*) 09.6 - 04.5 %   MCV 86.5  78.0 - 100.0 fL   MCH 27.3  26.0 - 34.0 pg   MCHC 31.6  30.0 - 36.0 g/dL   RDW 40.9  81.1 - 91.4 %   Platelets 361  150 - 400 K/uL  COMPREHENSIVE METABOLIC PANEL      Component Value Range   Sodium 135  135 - 145 mEq/L   Potassium 4.6  3.5 - 5.1 mEq/L   Chloride 100  96 - 112 mEq/L   CO2 26  19 - 32 mEq/L   Glucose, Bld 111 (*) 70 - 99 mg/dL   BUN 35 (*) 6 - 23 mg/dL   Creatinine, Ser 7.82 (*) 0.50 - 1.10 mg/dL   Calcium 9.6  8.4 - 95.6 mg/dL   Total  Protein 6.7  6.0 - 8.3  g/dL   Albumin 3.1 (*) 3.5 - 5.2 g/dL   AST 13  0 - 37 U/L   ALT 13  0 - 35 U/L   Alkaline Phosphatase 200 (*) 39 - 117 U/L   Total Bilirubin 0.3  0.3 - 1.2 mg/dL   GFR calc non Af Amer 24 (*) >90 mL/min   GFR calc Af Amer 28 (*) >90 mL/min  LACTIC ACID, PLASMA      Component Value Range   Lactic Acid, Venous 1.0  0.5 - 2.2 mmol/L  POCT I-STAT, CHEM 8      Component Value Range   Sodium 138  135 - 145 mEq/L   Potassium 4.2  3.5 - 5.1 mEq/L   Chloride 105  96 - 112 mEq/L   BUN 36 (*) 6 - 23 mg/dL   Creatinine, Ser 4.54 (*) 0.50 - 1.10 mg/dL   Glucose, Bld 098 (*) 70 - 99 mg/dL   Calcium, Ion 1.19  1.47 - 1.30 mmol/L   TCO2 24  0 - 100 mmol/L   Hemoglobin 11.6 (*) 12.0 - 15.0 g/dL   HCT 82.9 (*) 56.2 - 13.0 %     Dg Abd Acute W/chest  04/06/2012  *RADIOLOGY REPORT*  Clinical Data: Abdominal pain, diarrhea, rectal bleeding.  ACUTE ABDOMEN SERIES (ABDOMEN 2 VIEW & CHEST 1 VIEW)  Comparison: Chest 03/29/2012.  Abdomen 04/19/2011.  Findings: Mild cardiac enlargement and pulmonary vascular congestion with interstitial changes possibly representing fibrosis or edema.  Calcification and torsion of the aorta.  No focal airspace consolidation.  No blunting of costophrenic angles.  No pneumothorax.  Chronic-appearing deformity and degenerative changes in the left shoulder.  Degenerative changes in the spine.  Old bilateral rib fractures with possible acute fracture of a right lower rib.  Scattered gas and stool in the colon.  No small or large bowel distension.  No free intra-abdominal air.  No abnormal air fluid levels.  No radiopaque stones.  Postoperative changes in both hips. Probable old fracture deformities in the pubic rami bilaterally. No significant changes since the previous study.  IMPRESSION: Mild cardiac enlargement and pulmonary vascular congestion. Nonspecific interstitial pattern in the lungs.  Possible acute right rib fracture.  Nonobstructive bowel gas pattern.  Original Report  Authenticated By: Marlon Pel, M.D.    Dx: diarrhea  IV fluids. Labs obtained and reviewed as above. Serial abdominal exams with no tenderness or peritonitis. No bowel movements in emergency department with prolonged period of observation. Nursing home, [facility] called and they already have in order for stool Cdiff pending. Patient stable for discharge back to nursing home. No fevers, abdominal tenderness or recent antibiotics to suggest need to initiate treatment at this time. Lactate normal. Vital signs WNL.   MDM   Nursing notes reviewed - no rectal bleeding and no significant anemia. Vital signs reviewed as above. Labs obtained and reviewed, normal lactate/electrolytes. baseline elevated creatinine. Imaging reviewed as above nonobstructive bowel gas pattern.IV fluids.       Sunnie Nielsen, MD 04/06/12 0201

## 2012-04-14 DIAGNOSIS — N189 Chronic kidney disease, unspecified: Secondary | ICD-10-CM

## 2012-05-16 ENCOUNTER — Encounter (HOSPITAL_COMMUNITY): Payer: Self-pay | Admitting: *Deleted

## 2012-05-16 ENCOUNTER — Emergency Department (HOSPITAL_COMMUNITY)
Admission: EM | Admit: 2012-05-16 | Discharge: 2012-05-16 | Disposition: A | Discharge status: Home / Self Care | Payer: Medicare Other | Attending: Emergency Medicine | Admitting: Emergency Medicine

## 2012-05-16 ENCOUNTER — Emergency Department (HOSPITAL_COMMUNITY): Payer: Medicare Other

## 2012-05-16 DIAGNOSIS — W050XXA Fall from non-moving wheelchair, initial encounter: Secondary | ICD-10-CM | POA: Insufficient documentation

## 2012-05-16 DIAGNOSIS — G309 Alzheimer's disease, unspecified: Secondary | ICD-10-CM | POA: Insufficient documentation

## 2012-05-16 DIAGNOSIS — M542 Cervicalgia: Secondary | ICD-10-CM | POA: Insufficient documentation

## 2012-05-16 DIAGNOSIS — F0280 Dementia in other diseases classified elsewhere without behavioral disturbance: Secondary | ICD-10-CM | POA: Insufficient documentation

## 2012-05-16 DIAGNOSIS — R109 Unspecified abdominal pain: Secondary | ICD-10-CM | POA: Insufficient documentation

## 2012-05-16 DIAGNOSIS — R51 Headache: Secondary | ICD-10-CM | POA: Insufficient documentation

## 2012-05-16 DIAGNOSIS — F0390 Unspecified dementia without behavioral disturbance: Secondary | ICD-10-CM | POA: Insufficient documentation

## 2012-05-16 DIAGNOSIS — I10 Essential (primary) hypertension: Secondary | ICD-10-CM | POA: Insufficient documentation

## 2012-05-16 DIAGNOSIS — Y9210 Unspecified residential institution as the place of occurrence of the external cause: Secondary | ICD-10-CM | POA: Insufficient documentation

## 2012-05-16 DIAGNOSIS — R079 Chest pain, unspecified: Secondary | ICD-10-CM | POA: Insufficient documentation

## 2012-05-16 DIAGNOSIS — Z79899 Other long term (current) drug therapy: Secondary | ICD-10-CM | POA: Insufficient documentation

## 2012-05-16 DIAGNOSIS — W19XXXA Unspecified fall, initial encounter: Secondary | ICD-10-CM

## 2012-05-16 HISTORY — DX: Fracture of unspecified part of neck of unspecified femur, initial encounter for closed fracture: S72.009A

## 2012-05-16 NOTE — ED Notes (Signed)
Report called back to [facility] spoke w/ Clovis Cao. Family transported pt back to facility.

## 2012-05-16 NOTE — ED Notes (Signed)
Pt from [facility], staff sent pt to er due to fall, pt was rolling around in her wheelchair when she went to transfer to another chair and her wheelchair rolled out from under her. Staff found pt in floor, pt does not complain of any pain, pt able to answer simple questions, becomes agitated during triage process, stating "i fell", staff at [facility] verify that pt is at baseline with her mental status. Pt had been incontinent of bowel movement after fall, pt cleaned on arrival to tx room,

## 2012-05-16 NOTE — ED Provider Notes (Signed)
History     CSN: [id number]  Arrival date & time 05/16/12  1609   First MD Initiated Contact with Patient 05/16/12 1657      Chief Complaint  Patient presents with  . Fall     Patient is a 78 y.o. female presenting with fall. The history is provided by the EMS personnel and the nursing home. History Limited By: Hx dementia.  Fall  Pt was seen at 1750.  Per EMS and NH report, pt was transferring from her wheelchair to a chair and the wheelchair rolled out from underneath her. Pt fell to floor. Pt herself denies any pain, hx dementia, only states "I fell."  Pt has been acting per her baseline per NH staff and family at bedside.      Past Medical History  Diagnosis Date  . Hypertension   . Thyroid disease     hypothyroid  . Cancer     breast  . Hypothyroid   . Alzheimer disease   . Osteoporosis   . Hip fracture     Past Surgical History  Procedure Date  . Mastectomy     right    History  Substance Use Topics  . Smoking status: Never Smoker   . Smokeless tobacco: Never Used  . Alcohol Use: No    Review of Systems  Unable to perform ROS: Dementia    Allergies  Ace inhibitors; Penicillins; and Sulfa antibiotics  Home Medications   Current Outpatient Rx  Name Route Sig Dispense Refill  . AMLODIPINE BESYLATE 10 MG PO TABS Oral Take 10 mg by mouth daily.      . ASPIRIN 81 MG PO TBEC Oral Take 81 mg by mouth daily.      Marland Kitchen CALCIUM CARBONATE 600 MG PO TABS Oral Take 600 mg by mouth 3 (three) times daily.      Marland Kitchen VITAMIN D 1000 UNITS PO TABS Oral Take 1,000 Units by mouth 2 (two) times daily.    Marland Kitchen LEVOTHYROXINE SODIUM 175 MCG PO TABS Oral Take 175 mcg by mouth daily.    . MEGESTROL ACETATE 40 MG/ML PO SUSP Oral Take 400 mg by mouth 2 (two) times daily.    Marland Kitchen MEMANTINE HCL 10 MG PO TABS Oral Take 10 mg by mouth 2 (two) times daily.      Marland Kitchen MIRTAZAPINE 30 MG PO TABS Oral Take 30 mg by mouth at bedtime.      . ENSURE PO Oral Take 237 mLs by mouth 3 (three) times daily.      Marland Kitchen OMEPRAZOLE 20 MG PO CPDR Oral Take 20 mg by mouth daily.    Marland Kitchen POLYETHYLENE GLYCOL 3350 PO PACK Oral Take 17 g by mouth daily. constipation    . RIVASTIGMINE 4.6 MG/24HR TD PT24 Transdermal Place 1 patch onto the skin daily.      . SERTRALINE HCL 25 MG PO TABS Oral Take 25 mg by mouth daily.      Marland Kitchen ZINC OXIDE 20 % EX OINT Topical Apply 1 application topically 3 (three) times daily as needed. *One application applied to affected area(s) of buttocks three times daily as needed for redness    . ACETAMINOPHEN ER 650 MG PO TBCR Oral Take 650 mg by mouth every 8 (eight) hours as needed.      BP 157/74  Pulse 100  Temp 98.5 F (36.9 C)  Resp 20  SpO2 99%  Physical Exam 1755: Physical examination:  Nursing notes reviewed; Vital signs and O2 SAT reviewed;  Constitutional: Well developed, Well nourished, Well hydrated, In no acute distress; Head:  Normocephalic, atraumatic; Eyes: EOMI, PERRL, No scleral icterus; ENMT: Mouth and pharynx normal, Mucous membranes moist; Neck: Supple, Full range of motion, No lymphadenopathy; Cardiovascular: Regular rate and rhythm, No gallop; Respiratory: Breath sounds clear & equal bilaterally, No wheezes.  Speaking full sentences with ease, Normal respiratory effort/excursion; Chest: Nontender, Movement normal; Abdomen: Soft, Nontender, Nondistended, Normal bowel sounds;; Spine:  No midline CS, TS, LS tenderness.;; Extremities: Pulses normal, No tenderness, No deformity, No calf edema or asymmetry.; Neuro: Awake, alert, confused re: time, place, events. Major CN grossly intact.  No facial droop. Speech clear. Moves all ext well on stretcher spontaneously without apparent gross focal motor deficits.; Skin: Color normal, Warm, Dry.   ED Course  Procedures    MDM  MDM Reviewed: previous chart, nursing note and vitals Interpretation: CT scan and x-ray    Dg Chest 1 View 05/16/2012  *RADIOLOGY REPORT*  Clinical Data: 78 year old female status post fall with pain.   CHEST - 1 VIEW  Comparison: 04/14/2012 and earlier.  Findings: AP supine view 1841 hours.  Improved lung volumes and left basilar ventilation.  No pneumothorax or effusion evident on the supine view.  No pulmonary contusion.  Stable cardiac size and mediastinal contours.  Osteopenia.  No definite acute fracture identified in the thorax. There are probable chronic right lateral rib fractures which appear stable.  IMPRESSION: Improved left lung base ventilation. No acute cardiopulmonary abnormality or acute traumatic injury identified.   Original Report Authenticated By: Harley Hallmark, M.D.    Dg Pelvis 1-2 Views 05/16/2012  *RADIOLOGY REPORT*  Clinical Data: 78 year old female status post fall with pain.  PELVIS - 1-2 VIEW  Comparison: 04/06/2012 and earlier.  Findings: Previous postoperative changes to both femurs.  Hardware appears stable and intact.  Osteopenia.  Chronic left pubic rami fractures with callus formation.  No acute pelvic fracture identified.  IMPRESSION: Stable chronic left pubic rami fractures and bilateral proximal femur hardware.   Original Report Authenticated By: Harley Hallmark, M.D.    Ct Head Wo Contrast 05/16/2012  *RADIOLOGY REPORT*  Clinical Data:  History of trauma from a fall.  Head and neck pain.  CT HEAD WITHOUT CONTRAST CT CERVICAL SPINE WITHOUT CONTRAST  Technique:  Multidetector CT imaging of the head and cervical spine was performed following the standard protocol without intravenous contrast.  Multiplanar CT image reconstructions of the cervical spine were also generated.  Comparison:  CT of the head and cervical spine 03/30/2012.  CT HEAD  Findings: Multiple old bilateral basal ganglia (left greater than right) lacunar infarctions.  Moderate - severe cerebral and moderate cerebellar atrophy is similar to the prior examination. Extensive patchy and confluent areas of decreased attenuation throughout the deep and periventricular white matter of the cerebral hemispheres  bilaterally, compatible with chronic microvascular ischemic changes, similar to the prior examination. Focus of low attenuation in the right cerebellar hemisphere, likely related to prior infarction.  No acute displaced skull fractures are identified.  No evidence of acute post-traumatic intracranial hemorrhage, and no definite focal mass, mass effect, hydrocephalus or other abnormal intra or extra-axial fluid collections. Physiologic calcifications of the basal ganglia are unchanged. Visualized paranasal sinuses and mastoids are generally well pneumatized, with the exception of complete chronic opacification of the right maxillary sinus with inspissated partially calcified secretions.  IMPRESSION: 1.  No acute displaced skull fracture or findings to suggest acute intracranial trauma. 2.  The appearance of the head  and brain is unchanged compared to recent prior examination 03/30/2012, as detailed above.  CT CERVICAL SPINE  Findings: No acute displaced fractures of the cervical spine are noted.  Posterior fixation at C1-C2 is again noted.  Mild exaggeration of normal cervical lordosis is chronic and unchanged compared to the prior examination.  There is severe multilevel degenerative disc disease, most pronounced at C3-C4, C4-C5 and C5- C6.  Multilevel facet arthropathy is again noted.  Prevertebral soft tissues are normal.  Visualized portions of the upper thorax are remarkable for mild bilateral apical pleuroparenchymal thickening compatible with scarring (unchanged).  IMPRESSION: 1.  No evidence of significant acute traumatic injury to the cervical spine.  Multilevel degenerative disc disease and cervical spondylosis, in addition to postoperative changes of cervical fixation at C1-C2 are redemonstrated, as above.   Original Report Authenticated By: Florencia Reasons, M.D.    Ct Cervical Spine Wo Contrast 05/16/2012  *RADIOLOGY REPORT*  Clinical Data:  History of trauma from a fall.  Head and neck pain.  CT  HEAD WITHOUT CONTRAST CT CERVICAL SPINE WITHOUT CONTRAST  Technique:  Multidetector CT imaging of the head and cervical spine was performed following the standard protocol without intravenous contrast.  Multiplanar CT image reconstructions of the cervical spine were also generated.  Comparison:  CT of the head and cervical spine 03/30/2012.  CT HEAD  Findings: Multiple old bilateral basal ganglia (left greater than right) lacunar infarctions.  Moderate - severe cerebral and moderate cerebellar atrophy is similar to the prior examination. Extensive patchy and confluent areas of decreased attenuation throughout the deep and periventricular white matter of the cerebral hemispheres bilaterally, compatible with chronic microvascular ischemic changes, similar to the prior examination. Focus of low attenuation in the right cerebellar hemisphere, likely related to prior infarction.  No acute displaced skull fractures are identified.  No evidence of acute post-traumatic intracranial hemorrhage, and no definite focal mass, mass effect, hydrocephalus or other abnormal intra or extra-axial fluid collections. Physiologic calcifications of the basal ganglia are unchanged. Visualized paranasal sinuses and mastoids are generally well pneumatized, with the exception of complete chronic opacification of the right maxillary sinus with inspissated partially calcified secretions.  IMPRESSION: 1.  No acute displaced skull fracture or findings to suggest acute intracranial trauma. 2.  The appearance of the head and brain is unchanged compared to recent prior examination 03/30/2012, as detailed above.  CT CERVICAL SPINE  Findings: No acute displaced fractures of the cervical spine are noted.  Posterior fixation at C1-C2 is again noted.  Mild exaggeration of normal cervical lordosis is chronic and unchanged compared to the prior examination.  There is severe multilevel degenerative disc disease, most pronounced at C3-C4, C4-C5 and C5- C6.   Multilevel facet arthropathy is again noted.  Prevertebral soft tissues are normal.  Visualized portions of the upper thorax are remarkable for mild bilateral apical pleuroparenchymal thickening compatible with scarring (unchanged).  IMPRESSION: 1.  No evidence of significant acute traumatic injury to the cervical spine.  Multilevel degenerative disc disease and cervical spondylosis, in addition to postoperative changes of cervical fixation at C1-C2 are redemonstrated, as above.   Original Report Authenticated By: Florencia Reasons, M.D.      Ruth.Sill:  Pt remains at baseline per family at bedside.  No acute findings on workup today.  Will send back to NH.  Dx testing d/w pt's family.  Questions answered.  Verb understanding, agreeable to d/c home with outpt f/u.  Laray Anger, DO 05/17/12 1142

## 2012-05-16 NOTE — ED Notes (Signed)
Pt alert, Family given discharge instructions, paperwork. Family transported back to [facility]. Report called back to [facility]. Pt left department w/ no further questions.

## 2012-05-16 NOTE — ED Notes (Signed)
Dr. Clarene Duke at bedside, pt and family updated on plan of care

## 2012-05-16 NOTE — ED Notes (Signed)
Patient informed that all test results are back & awaiting the EDP to review the chart. No needs voiced at this time time. NAD noted.  

## 2012-05-16 NOTE — ED Notes (Signed)
See triage note.

## 2012-07-06 ENCOUNTER — Emergency Department (HOSPITAL_COMMUNITY): Payer: Medicare Other

## 2012-07-06 ENCOUNTER — Emergency Department (HOSPITAL_COMMUNITY)
Admission: EM | Admit: 2012-07-06 | Discharge: 2012-07-06 | Disposition: A | Discharge status: Skilled Nursing Facility | Payer: Medicare Other | Attending: Emergency Medicine | Admitting: Emergency Medicine

## 2012-07-06 ENCOUNTER — Encounter (HOSPITAL_COMMUNITY): Payer: Self-pay | Admitting: Emergency Medicine

## 2012-07-06 DIAGNOSIS — F0280 Dementia in other diseases classified elsewhere without behavioral disturbance: Secondary | ICD-10-CM | POA: Insufficient documentation

## 2012-07-06 DIAGNOSIS — Z853 Personal history of malignant neoplasm of breast: Secondary | ICD-10-CM | POA: Insufficient documentation

## 2012-07-06 DIAGNOSIS — Z7982 Long term (current) use of aspirin: Secondary | ICD-10-CM | POA: Insufficient documentation

## 2012-07-06 DIAGNOSIS — I10 Essential (primary) hypertension: Secondary | ICD-10-CM | POA: Insufficient documentation

## 2012-07-06 DIAGNOSIS — Z79899 Other long term (current) drug therapy: Secondary | ICD-10-CM | POA: Insufficient documentation

## 2012-07-06 DIAGNOSIS — R29810 Facial weakness: Secondary | ICD-10-CM | POA: Insufficient documentation

## 2012-07-06 DIAGNOSIS — E079 Disorder of thyroid, unspecified: Secondary | ICD-10-CM | POA: Insufficient documentation

## 2012-07-06 DIAGNOSIS — G309 Alzheimer's disease, unspecified: Secondary | ICD-10-CM | POA: Insufficient documentation

## 2012-07-06 DIAGNOSIS — R509 Fever, unspecified: Secondary | ICD-10-CM | POA: Insufficient documentation

## 2012-07-06 LAB — URINALYSIS, ROUTINE W REFLEX MICROSCOPIC
Bilirubin Urine: NEGATIVE
Ketones, ur: NEGATIVE mg/dL
Nitrite: NEGATIVE
Urobilinogen, UA: 0.2 mg/dL (ref 0.0–1.0)

## 2012-07-06 LAB — CBC WITH DIFFERENTIAL/PLATELET
Basophils Relative: 0 % (ref 0–1)
Eosinophils Absolute: 0.2 10*3/uL (ref 0.0–0.7)
HCT: 35.1 % — ABNORMAL LOW (ref 36.0–46.0)
Hemoglobin: 11.5 g/dL — ABNORMAL LOW (ref 12.0–15.0)
MCH: 27.4 pg (ref 26.0–34.0)
MCHC: 32.8 g/dL (ref 30.0–36.0)
Monocytes Absolute: 0.6 10*3/uL (ref 0.1–1.0)
Monocytes Relative: 7 % (ref 3–12)
Neutrophils Relative %: 70 % (ref 43–77)

## 2012-07-06 LAB — BASIC METABOLIC PANEL
BUN: 66 mg/dL — ABNORMAL HIGH (ref 6–23)
Creatinine, Ser: 2.27 mg/dL — ABNORMAL HIGH (ref 0.50–1.10)
GFR calc Af Amer: 21 mL/min — ABNORMAL LOW (ref >90)
GFR calc non Af Amer: 18 mL/min — ABNORMAL LOW (ref >90)

## 2012-07-06 MED ORDER — SODIUM CHLORIDE 0.9 % IV BOLUS (SEPSIS)
250.0000 mL | Freq: Once | INTRAVENOUS | Status: AC
  Administered 2012-07-06: 250 mL via INTRAVENOUS

## 2012-07-06 MED ORDER — SODIUM CHLORIDE 0.9 % IV SOLN
INTRAVENOUS | Status: DC
  Administered 2012-07-06: 08:00:00 via INTRAVENOUS

## 2012-07-06 NOTE — ED Notes (Signed)
Patient lying in bed on left side. NAD noted. Respirations even and unlabored. Awaiting transport back to [facility].

## 2012-07-06 NOTE — ED Provider Notes (Signed)
History   This chart was scribed for Martha Jakes, MD, by Marcina Millard scribe. The patient was seen in room APA04/APA04 and the patient's care was started at 0718.    CSN: [id number]  Arrival date & time 07/06/12  0710   First MD Initiated Contact with Patient 07/06/12 [phone]      Chief Complaint  Patient presents with  . Weakness    (Consider location/radiation/quality/duration/timing/severity/associated sxs/prior treatment) HPI Comments: Martha Pearson is a 78 y.o. female brought in by ambulance, who presents to the Emergency Department complaining of generalized weak that began this morning. She is a pt at [facility], and they called EMS after she awoke because she was not combative like usual. They also report a right-sided facial droop although she has a h/o facial droops. In ED, nursing reports that the left sides appears to be dropping more. Temperature here in ED is 100.1 She is a DNR. The pt is a Level 5 Caveat due to dementia.        Past Medical History  Diagnosis Date  . Hypertension   . Thyroid disease     hypothyroid  . Cancer     breast  . Hypothyroid   . Alzheimer disease   . Osteoporosis   . Hip fracture     Past Surgical History  Procedure Date  . Mastectomy     right    History reviewed. No pertinent family history.  History  Substance Use Topics  . Smoking status: Never Smoker   . Smokeless tobacco: Never Used  . Alcohol Use: No    OB History    Grav Para Term Preterm Abortions TAB SAB Ect Mult Living                  Review of Systems  Unable to perform ROS: Dementia    Allergies  Ace inhibitors; Penicillins; and Sulfa antibiotics  Home Medications   Current Outpatient Rx  Name  Route  Sig  Dispense  Refill  . AMLODIPINE BESYLATE 10 MG PO TABS   Oral   Take 10 mg by mouth daily.           . ASPIRIN 81 MG PO TBEC   Oral   Take 81 mg by mouth daily.           Marland Kitchen CALCIUM CARBONATE 600 MG PO TABS   Oral  Take 600 mg by mouth 3 (three) times daily.           Marland Kitchen VITAMIN D 1000 UNITS PO TABS   Oral   Take 1,000 Units by mouth 2 (two) times daily.         Marland Kitchen LEVOTHYROXINE SODIUM 100 MCG PO TABS   Oral   Take 100 mcg by mouth daily.         . MEGESTROL ACETATE 40 MG/ML PO SUSP   Oral   Take 400 mg by mouth 2 (two) times daily.         Marland Kitchen MEMANTINE HCL 10 MG PO TABS   Oral   Take 10 mg by mouth 2 (two) times daily.           Marland Kitchen MIRTAZAPINE 30 MG PO TABS   Oral   Take 30 mg by mouth at bedtime.           . OMEPRAZOLE 20 MG PO CPDR   Oral   Take 20 mg by mouth daily.         Marland Kitchen  POLYETHYLENE GLYCOL 3350 PO PACK   Oral   Take 17 g by mouth daily. constipation         . RIVASTIGMINE 4.6 MG/24HR TD PT24   Transdermal   Place 1 patch onto the skin daily.           . SERTRALINE HCL 25 MG PO TABS   Oral   Take 25 mg by mouth daily.           Marland Kitchen ZINC OXIDE 20 % EX OINT   Topical   Apply 1 application topically 3 (three) times daily as needed. *One application applied to affected area(s) of buttocks three times daily as needed for redness           BP 169/96  Pulse 95  Temp 100.1 F (37.8 C) (Rectal)  Resp 23  SpO2 96%  Physical Exam  Nursing note and vitals reviewed. Constitutional: She appears well-developed and well-nourished.       She is chewing on her fingers.  HENT:  Mouth/Throat: Oropharynx is clear and moist.  Cardiovascular: Regular rhythm.   Pulmonary/Chest: Effort normal.  Abdominal: Soft. Bowel sounds are normal. There is no tenderness.  Musculoskeletal: Normal range of motion. She exhibits no edema.       She is using her right arm and moving all extremities. She has no swelling over her legs.          Neurological: She is alert. No cranial nerve deficit. She exhibits normal muscle tone. Coordination normal.       She has a right-sided facial droop.  Skin: No erythema.  Psychiatric: Her behavior is normal.    ED Course    Procedures (including critical care time)  DIAGNOSTIC STUDIES: Oxygen Saturation is 93% on room air, adequate by my interpretation.    COORDINATION OF CARE:  07:32- Performed physical exam. Ordered blood work and a chest X-ray.  07:45- Medication  Orders- 0.9% sodium chloride infusion- continuous.  Results for orders placed during the hospital encounter of 07/06/12  CBC WITH DIFFERENTIAL      Component Value Range   WBC 9.0  4.0 - 10.5 K/uL   RBC 4.20  3.87 - 5.11 MIL/uL   Hemoglobin 11.5 (*) 12.0 - 15.0 g/dL   HCT 16.1 (*) 09.6 - 04.5 %   MCV 83.6  78.0 - 100.0 fL   MCH 27.4  26.0 - 34.0 pg   MCHC 32.8  30.0 - 36.0 g/dL   RDW 40.9 (*) 81.1 - 91.4 %   Platelets 288  150 - 400 K/uL   Neutrophils Relative 70  43 - 77 %   Neutro Abs 6.3  1.7 - 7.7 K/uL   Lymphocytes Relative 20  12 - 46 %   Lymphs Abs 1.8  0.7 - 4.0 K/uL   Monocytes Relative 7  3 - 12 %   Monocytes Absolute 0.6  0.1 - 1.0 K/uL   Eosinophils Relative 3  0 - 5 %   Eosinophils Absolute 0.2  0.0 - 0.7 K/uL   Basophils Relative 0  0 - 1 %   Basophils Absolute 0.0  0.0 - 0.1 K/uL  BASIC METABOLIC PANEL      Component Value Range   Sodium 145  135 - 145 mEq/L   Potassium 4.8  3.5 - 5.1 mEq/L   Chloride 114 (*) 96 - 112 mEq/L   CO2 23  19 - 32 mEq/L   Glucose, Bld 96  70 - 99 mg/dL  BUN 66 (*) 6 - 23 mg/dL   Creatinine, Ser 1.61 (*) 0.50 - 1.10 mg/dL   Calcium 8.6  8.4 - 09.6 mg/dL   GFR calc non Af Amer 18 (*) >90 mL/min   GFR calc Af Amer 21 (*) >90 mL/min  URINALYSIS, ROUTINE W REFLEX MICROSCOPIC      Component Value Range   Color, Urine YELLOW  YELLOW   APPearance CLEAR  CLEAR   Specific Gravity, Urine 1.020  1.005 - 1.030   pH 7.0  5.0 - 8.0   Glucose, UA NEGATIVE  NEGATIVE mg/dL   Hgb urine dipstick TRACE (*) NEGATIVE   Bilirubin Urine NEGATIVE  NEGATIVE   Ketones, ur NEGATIVE  NEGATIVE mg/dL   Protein, ur 045 (*) NEGATIVE mg/dL   Urobilinogen, UA 0.2  0.0 - 1.0 mg/dL   Nitrite NEGATIVE   NEGATIVE   Leukocytes, UA NEGATIVE  NEGATIVE  URINE MICROSCOPIC-ADD ON      Component Value Range   Squamous Epithelial / LPF FEW (*) RARE   WBC, UA 0-2  <3 WBC/hpf   RBC / HPF 0-2  <3 RBC/hpf   Bacteria, UA FEW (*) RARE   Casts GRANULAR CAST (*) NEGATIVE     Labs Reviewed  CBC WITH DIFFERENTIAL - Abnormal; Notable for the following:    Hemoglobin 11.5 (*)     HCT 35.1 (*)     RDW 15.6 (*)     All other components within normal limits  BASIC METABOLIC PANEL - Abnormal; Notable for the following:    Chloride 114 (*)     BUN 66 (*)     Creatinine, Ser 2.27 (*)     GFR calc non Af Amer 18 (*)     GFR calc Af Amer 21 (*)     All other components within normal limits  URINALYSIS, ROUTINE W REFLEX MICROSCOPIC - Abnormal; Notable for the following:    Hgb urine dipstick TRACE (*)     Protein, ur 100 (*)     All other components within normal limits  URINE MICROSCOPIC-ADD ON - Abnormal; Notable for the following:    Squamous Epithelial / LPF FEW (*)     Bacteria, UA FEW (*)     Casts GRANULAR CAST (*)     All other components within normal limits   Ct Head Wo Contrast  07/06/2012  *RADIOLOGY REPORT*  Clinical Data: Weakness.  Altered level of consciousness.  CT HEAD WITHOUT CONTRAST  Technique:  Contiguous axial images were obtained from the base of the skull through the vertex without contrast.  Comparison: Head CT 05/16/2012.  Findings: Again noted is moderate cerebral and cerebellar atrophy. There are extensive patchy and confluent areas of decreased attenuation throughout the deep and periventricular white matter of the cerebral hemispheres bilaterally, compatible with advanced chronic microvascular ischemic disease. Old lacunar infarctions are noted in the left basal ganglia.  Physiologic calcifications of the basal ganglia bilaterally.  No acute displaced skull fractures are identified.  Visualized paranasal sinuses and mastoids are generally well pneumatized, with exception of  extensive mucoperiosteal thickening and chronic inspissated secretions within the right maxillary sinus, and mild mucosal thickening in the left maxillary sinus.  IMPRESSION: 1.  No acute intracranial abnormalities. 2.  The appearance the brain is unchanged compared to 05/16/2012. 3.  Chronic sinusitis in the right maxillary sinus redemonstrated.   Original Report Authenticated By: Trudie Reed, M.D.    Dg Chest Port 1 View  07/06/2012  *RADIOLOGY REPORT*  Clinical  Data: Weakness  PORTABLE CHEST - 1 VIEW  Comparison: 05/16/2012  Findings: The cardiac silhouette is normal in size.  No mediastinal or hilar mass or adenopathy.  Increased lung markings, likely a combination scarring and subsegmental atelectasis.  No acute findings in the lungs.  No pneumothorax.  Bony thorax is demineralized with an old, healed fracture of the proximal left humerus.  IMPRESSION: No acute cardiopulmonary disease.   Original Report Authenticated By: Amie Portland, M.D.     Date: 07/06/12  Rate: 96  Rhythm: normal sinus rhythm  QRS Axis: normal  Intervals: normal  ST/T Wave abnormalities: nonspecific ST/T changes  Conduction Disutrbances:right bundle branch block  Narrative Interpretation:   Old EKG Reviewed: unchanged From 03/29/12   1. Fever       MDM   and the patient's [facility].4 decreased mental status. Arrival here to the emergency room patient's mental status seemed to be baseline. Nursing home noted right-sided facial drooping no evidence of that here it looks like more the left side was drooping. Patient was alert seems to be baseline from what we noted her from before. Workup without any specific findings other than a fever. No leukocytosis no pneumonia no urinary tract infection head CT was done no evidence of intercranial of head bleed or evidence of stroke. Patient known to have a significant dementia and also is a DO NOT RESUSCITATE. Patient will be sent back to the nursing facility with  precautions.  I personally performed the services described in this documentation, which was scribed in my presence. The recorded information has been reviewed and is accurate.         Martha Jakes, MD 07/06/12 1034

## 2012-07-06 NOTE — ED Notes (Signed)
Patient with no complaints at this time. Respirations even and unlabored. Skin warm/dry. Discharge instructions reviewed with patient's caregiver at this time. Patient's caregiver given opportunity to voice concerns/ask questions. IV removed per policy and band-aid applied to site. Patient discharged at this time and left Emergency Department via wheelchair with Caregiver.

## 2012-07-06 NOTE — ED Notes (Signed)
Spoke with [facility], informed that patient is being discharged back to their care. They are to send transportation for discharge.

## 2012-07-06 NOTE — ED Notes (Signed)
Nursing facility woke pt up this am and pt was not combative like usual and they noticed rt sided face drooping. Pt does not follow commands due to dementia.

## 2012-07-09 ENCOUNTER — Inpatient Hospital Stay (HOSPITAL_COMMUNITY)
Admission: AD | Admit: 2012-07-09 | Discharge: 2012-07-13 | DRG: 291 | Disposition: A | Discharge status: Skilled Nursing Facility | Payer: Medicare Other | Source: Ambulatory Visit | Attending: Internal Medicine | Admitting: Internal Medicine

## 2012-07-09 ENCOUNTER — Encounter (HOSPITAL_COMMUNITY): Payer: Self-pay | Admitting: General Practice

## 2012-07-09 DIAGNOSIS — R5381 Other malaise: Secondary | ICD-10-CM | POA: Diagnosis present

## 2012-07-09 DIAGNOSIS — I509 Heart failure, unspecified: Secondary | ICD-10-CM

## 2012-07-09 DIAGNOSIS — Z7401 Bed confinement status: Secondary | ICD-10-CM

## 2012-07-09 DIAGNOSIS — G9349 Other encephalopathy: Secondary | ICD-10-CM | POA: Diagnosis present

## 2012-07-09 DIAGNOSIS — I10 Essential (primary) hypertension: Secondary | ICD-10-CM

## 2012-07-09 DIAGNOSIS — G9340 Encephalopathy, unspecified: Secondary | ICD-10-CM

## 2012-07-09 DIAGNOSIS — Z853 Personal history of malignant neoplasm of breast: Secondary | ICD-10-CM

## 2012-07-09 DIAGNOSIS — Z880 Allergy status to penicillin: Secondary | ICD-10-CM

## 2012-07-09 DIAGNOSIS — Z79899 Other long term (current) drug therapy: Secondary | ICD-10-CM

## 2012-07-09 DIAGNOSIS — Z882 Allergy status to sulfonamides status: Secondary | ICD-10-CM

## 2012-07-09 DIAGNOSIS — N390 Urinary tract infection, site not specified: Secondary | ICD-10-CM

## 2012-07-09 DIAGNOSIS — Z23 Encounter for immunization: Secondary | ICD-10-CM

## 2012-07-09 DIAGNOSIS — N184 Chronic kidney disease, stage 4 (severe): Secondary | ICD-10-CM

## 2012-07-09 DIAGNOSIS — G309 Alzheimer's disease, unspecified: Secondary | ICD-10-CM | POA: Diagnosis present

## 2012-07-09 DIAGNOSIS — Z888 Allergy status to other drugs, medicaments and biological substances status: Secondary | ICD-10-CM

## 2012-07-09 DIAGNOSIS — I5033 Acute on chronic diastolic (congestive) heart failure: Principal | ICD-10-CM

## 2012-07-09 DIAGNOSIS — Z66 Do not resuscitate: Secondary | ICD-10-CM | POA: Diagnosis present

## 2012-07-09 DIAGNOSIS — Z993 Dependence on wheelchair: Secondary | ICD-10-CM

## 2012-07-09 DIAGNOSIS — Z9010 Acquired absence of unspecified breast and nipple: Secondary | ICD-10-CM

## 2012-07-09 DIAGNOSIS — F0280 Dementia in other diseases classified elsewhere without behavioral disturbance: Secondary | ICD-10-CM | POA: Diagnosis present

## 2012-07-09 DIAGNOSIS — F0390 Unspecified dementia without behavioral disturbance: Secondary | ICD-10-CM

## 2012-07-09 DIAGNOSIS — I129 Hypertensive chronic kidney disease with stage 1 through stage 4 chronic kidney disease, or unspecified chronic kidney disease: Secondary | ICD-10-CM | POA: Diagnosis present

## 2012-07-09 DIAGNOSIS — I517 Cardiomegaly: Secondary | ICD-10-CM

## 2012-07-09 DIAGNOSIS — M810 Age-related osteoporosis without current pathological fracture: Secondary | ICD-10-CM | POA: Diagnosis present

## 2012-07-09 DIAGNOSIS — R9431 Abnormal electrocardiogram [ECG] [EKG]: Secondary | ICD-10-CM | POA: Diagnosis not present

## 2012-07-09 DIAGNOSIS — Z7982 Long term (current) use of aspirin: Secondary | ICD-10-CM

## 2012-07-09 DIAGNOSIS — E039 Hypothyroidism, unspecified: Secondary | ICD-10-CM

## 2012-07-09 HISTORY — DX: Acute on chronic diastolic (congestive) heart failure: I50.33

## 2012-07-09 HISTORY — DX: Chronic kidney disease, stage 4 (severe): N18.4

## 2012-07-09 HISTORY — DX: Cardiomegaly: I51.7

## 2012-07-09 LAB — TROPONIN I: Troponin I: <0.3 ng/mL (ref <0.30)

## 2012-07-09 LAB — CBC WITH DIFFERENTIAL/PLATELET
Basophils Absolute: 0.1 10*3/uL (ref 0.0–0.1)
Basophils Relative: 1 % (ref 0–1)
Eosinophils Absolute: 0.4 10*3/uL (ref 0.0–0.7)
Eosinophils Relative: 3 % (ref 0–5)
HCT: 41.8 % (ref 36.0–46.0)
Hemoglobin: 13.8 g/dL (ref 12.0–15.0)
Lymphocytes Relative: 15 % (ref 12–46)
Lymphs Abs: 1.7 10*3/uL (ref 0.7–4.0)
MCH: 27.7 pg (ref 26.0–34.0)
MCHC: 33 g/dL (ref 30.0–36.0)
MCV: 83.8 fL (ref 78.0–100.0)
Monocytes Absolute: 0.9 10*3/uL (ref 0.1–1.0)
Monocytes Relative: 8 % (ref 3–12)
Neutro Abs: 8.4 10*3/uL — ABNORMAL HIGH (ref 1.7–7.7)
Neutrophils Relative %: 73 % (ref 43–77)
Platelets: 290 10*3/uL (ref 150–400)
RBC: 4.99 MIL/uL (ref 3.87–5.11)
RDW: 15.7 % — ABNORMAL HIGH (ref 11.5–15.5)
WBC: 11.4 10*3/uL — ABNORMAL HIGH (ref 4.0–10.5)

## 2012-07-09 LAB — COMPREHENSIVE METABOLIC PANEL
ALT: 15 U/L (ref 0–35)
Calcium: 9.5 mg/dL (ref 8.4–10.5)
Creatinine, Ser: 2.32 mg/dL — ABNORMAL HIGH (ref 0.50–1.10)
GFR calc Af Amer: 20 mL/min — ABNORMAL LOW (ref >90)
Glucose, Bld: 92 mg/dL (ref 70–99)
Sodium: 142 mEq/L (ref 135–145)
Total Protein: 6.7 g/dL (ref 6.0–8.3)

## 2012-07-09 LAB — PRO B NATRIURETIC PEPTIDE: Pro B Natriuretic peptide (BNP): 10439 pg/mL — ABNORMAL HIGH (ref 0–450)

## 2012-07-09 LAB — URINALYSIS, ROUTINE W REFLEX MICROSCOPIC
Glucose, UA: NEGATIVE mg/dL
Hgb urine dipstick: NEGATIVE
Leukocytes, UA: NEGATIVE
Specific Gravity, Urine: 1.015 (ref 1.005–1.030)
pH: 6 (ref 5.0–8.0)

## 2012-07-09 LAB — URINE MICROSCOPIC-ADD ON

## 2012-07-09 MED ORDER — FUROSEMIDE 10 MG/ML IJ SOLN
40.0000 mg | Freq: Two times a day (BID) | INTRAMUSCULAR | Status: DC
  Administered 2012-07-09 – 2012-07-12 (×5): 40 mg via INTRAVENOUS
  Filled 2012-07-09 (×5): qty 4

## 2012-07-09 MED ORDER — MEGESTROL ACETATE 400 MG/10ML PO SUSP
400.0000 mg | Freq: Two times a day (BID) | ORAL | Status: DC
  Administered 2012-07-09 – 2012-07-13 (×7): 400 mg via ORAL
  Filled 2012-07-09 (×9): qty 10

## 2012-07-09 MED ORDER — SODIUM CHLORIDE 0.9 % IJ SOLN: 3.0000 mL | INTRAMUSCULAR | Status: DC | PRN

## 2012-07-09 MED ORDER — SERTRALINE HCL 50 MG PO TABS
25.0000 mg | ORAL_TABLET | Freq: Every day | ORAL | Status: DC
  Administered 2012-07-09 – 2012-07-13 (×5): 25 mg via ORAL
  Filled 2012-07-09 (×5): qty 1

## 2012-07-09 MED ORDER — POTASSIUM CHLORIDE CRYS ER 20 MEQ PO TBCR
20.0000 meq | EXTENDED_RELEASE_TABLET | Freq: Two times a day (BID) | ORAL | Status: DC
  Administered 2012-07-09: 20 meq via ORAL
  Filled 2012-07-09: qty 1

## 2012-07-09 MED ORDER — AMLODIPINE BESYLATE 5 MG PO TABS
10.0000 mg | ORAL_TABLET | Freq: Every day | ORAL | Status: DC
  Administered 2012-07-09 – 2012-07-13 (×5): 10 mg via ORAL
  Filled 2012-07-09 (×5): qty 2

## 2012-07-09 MED ORDER — MEMANTINE HCL 10 MG PO TABS
10.0000 mg | ORAL_TABLET | Freq: Two times a day (BID) | ORAL | Status: DC
  Administered 2012-07-09 – 2012-07-13 (×8): 10 mg via ORAL
  Filled 2012-07-09 (×8): qty 1

## 2012-07-09 MED ORDER — ENOXAPARIN SODIUM 30 MG/0.3ML ~~LOC~~ SOLN
30.0000 mg | SUBCUTANEOUS | Status: DC
  Administered 2012-07-09 – 2012-07-12 (×4): 30 mg via SUBCUTANEOUS
  Filled 2012-07-09 (×4): qty 0.3

## 2012-07-09 MED ORDER — PANTOPRAZOLE SODIUM 40 MG PO TBEC
40.0000 mg | DELAYED_RELEASE_TABLET | Freq: Every day | ORAL | Status: DC
  Administered 2012-07-09 – 2012-07-13 (×5): 40 mg via ORAL
  Filled 2012-07-09 (×5): qty 1

## 2012-07-09 MED ORDER — ONDANSETRON HCL 4 MG/2ML IJ SOLN: 4.0000 mg | Freq: Four times a day (QID) | INTRAMUSCULAR | Status: DC | PRN

## 2012-07-09 MED ORDER — HYDRALAZINE HCL 20 MG/ML IJ SOLN: 10.0000 mg | Freq: Four times a day (QID) | INTRAMUSCULAR | Status: DC | PRN

## 2012-07-09 MED ORDER — SODIUM CHLORIDE 0.9 % IJ SOLN
3.0000 mL | Freq: Two times a day (BID) | INTRAMUSCULAR | Status: DC
  Administered 2012-07-09 – 2012-07-13 (×5): 3 mL via INTRAVENOUS
  Filled 2012-07-09: qty 3

## 2012-07-09 MED ORDER — INFLUENZA VIRUS VACC SPLIT PF IM SUSP
0.5000 mL | INTRAMUSCULAR | Status: AC
  Administered 2012-07-10: 0.5 mL via INTRAMUSCULAR
  Filled 2012-07-09: qty 0.5

## 2012-07-09 MED ORDER — CARVEDILOL 3.125 MG PO TABS
6.2500 mg | ORAL_TABLET | Freq: Two times a day (BID) | ORAL | Status: DC
  Administered 2012-07-10 – 2012-07-13 (×5): 6.25 mg via ORAL
  Filled 2012-07-09 (×5): qty 2

## 2012-07-09 MED ORDER — MIRTAZAPINE 30 MG PO TABS
30.0000 mg | ORAL_TABLET | Freq: Every day | ORAL | Status: DC
  Administered 2012-07-09 – 2012-07-12 (×4): 30 mg via ORAL
  Filled 2012-07-09 (×4): qty 1

## 2012-07-09 MED ORDER — ASPIRIN EC 81 MG PO TBEC
81.0000 mg | DELAYED_RELEASE_TABLET | Freq: Every day | ORAL | Status: DC
  Administered 2012-07-09 – 2012-07-13 (×5): 81 mg via ORAL
  Filled 2012-07-09 (×7): qty 1

## 2012-07-09 MED ORDER — MEGESTROL ACETATE 400 MG/10ML PO SUSP
ORAL | Status: AC
  Filled 2012-07-09: qty 10

## 2012-07-09 MED ORDER — ACETAMINOPHEN 325 MG PO TABS: 650.0000 mg | ORAL_TABLET | ORAL | Status: DC | PRN

## 2012-07-09 MED ORDER — POLYETHYLENE GLYCOL 3350 17 G PO PACK
17.0000 g | PACK | Freq: Every day | ORAL | Status: DC
  Administered 2012-07-09 – 2012-07-13 (×4): 17 g via ORAL
  Filled 2012-07-09 (×5): qty 1

## 2012-07-09 MED ORDER — SODIUM CHLORIDE 0.9 % IV SOLN
250.0000 mL | INTRAVENOUS | Status: DC | PRN
  Administered 2012-07-10 – 2012-07-11 (×2): 250 mL via INTRAVENOUS

## 2012-07-09 MED ORDER — RIVASTIGMINE 4.6 MG/24HR TD PT24
4.6000 mg | MEDICATED_PATCH | Freq: Every day | TRANSDERMAL | Status: DC
  Administered 2012-07-09 – 2012-07-13 (×5): 4.6 mg via TRANSDERMAL
  Filled 2012-07-09 (×7): qty 1

## 2012-07-09 MED ORDER — LEVOTHYROXINE SODIUM 100 MCG PO TABS
100.0000 ug | ORAL_TABLET | Freq: Every day | ORAL | Status: DC
  Administered 2012-07-09 – 2012-07-13 (×5): 100 ug via ORAL
  Filled 2012-07-09 (×5): qty 1

## 2012-07-09 NOTE — H&P (Signed)
[facility] History and Physical  [name] FAO:[id number] DOB: [birth year] DOA: 07/09/2012  Referring physician: Dr. Margo Aye PCP: Dwana Melena, MD  Specialists:   Chief Complaint: lethargy, edema  HPI: Martha Pearson is a 78 y.o. female who is a resident of [facility], and has a history of hypertension, hypothyroidism, chronic kidney disease stage IV. Patient reported to her primary care physician's office today and was found to be lethargic. She also has generalized edema, worse in the lower extremities. She was sent as a direct admission from her primary care physician's office. Her cousin is her main caregiver and the history is obtained from her. Patient is unable to participate in history due to mental status and dementia. According to the patient's family, the patient has been having intermittent pedal edema for the past few months. She's also had associated shortness of breath on exertion. She has not described any chest pain. She has not described any orthopnea. Over the past week, her family has noticed that the patient has become increasingly lethargic. She is sleeping more is not as interactive as she was before. She was sent to the emergency room on 11/16. Workup at that time was found to be unremarkable. Chest x-ray did question pneumonia, urinalysis did not show any signs of infection. WBC count was found to be normal. Her creatinine was mildly elevated from her baseline of 1.8. The patient also did have a fever, without any source of infection. The patient was discharged back to [facility] with precautions. There does not appear to be any recurrence of any fevers. Her family reports that her by mouth intake has been adequate.  Review of Systems: Limited due to patient's mental status, pertinent positives as per history of present illness, otherwise negative. This is according to the patient's family.  Past Medical History  Diagnosis Date  . Hypertension   . Thyroid disease      hypothyroid  . Cancer     breast  . Hypothyroid   . Alzheimer disease   . Osteoporosis   . Hip fracture    Past Surgical History  Procedure Date  . Mastectomy     right   Social History:  reports that she has never smoked. She has never used smokeless tobacco. She reports that she does not drink alcohol or use illicit drugs. Patient lives at [facility]. Her family reports that the patient is essentially wheelchair-bound for the past few years. She has advanced dementia.  Allergies  Allergen Reactions  . Ace Inhibitors   . Penicillins   . Sulfa Antibiotics     Family history: Unable to assess due to patient's mental status.  Prior to Admission medications   Medication Sig Start Date End Date Taking? Authorizing Provider  amLODipine (NORVASC) 10 MG tablet Take 10 mg by mouth daily.      Historical Provider, MD  aspirin 81 MG EC tablet Take 81 mg by mouth daily.      Historical Provider, MD  calcium carbonate (OS-CAL) 600 MG TABS Take 600 mg by mouth 3 (three) times daily.      Historical Provider, MD  cholecalciferol (VITAMIN D) 1000 UNITS tablet Take 1,000 Units by mouth 2 (two) times daily.    Historical Provider, MD  levothyroxine (SYNTHROID, LEVOTHROID) 100 MCG tablet Take 100 mcg by mouth daily.    Historical Provider, MD  megestrol (MEGACE) 40 MG/ML suspension Take 400 mg by mouth 2 (two) times daily.    Historical Provider, MD  memantine [facility]) 10  MG tablet Take 10 mg by mouth 2 (two) times daily.      Historical Provider, MD  mirtazapine (REMERON) 30 MG tablet Take 30 mg by mouth at bedtime.      Historical Provider, MD  omeprazole (PRILOSEC) 20 MG capsule Take 20 mg by mouth daily.    Historical Provider, MD  polyethylene glycol (MIRALAX / GLYCOLAX) packet Take 17 g by mouth daily. constipation    Historical Provider, MD  rivastigmine (EXELON) 4.6 mg/24hr Place 1 patch onto the skin daily.      Historical Provider, MD  sertraline (ZOLOFT) 25 MG tablet Take 25  mg by mouth daily.      Historical Provider, MD  zinc oxide 20 % ointment Apply 1 application topically 3 (three) times daily as needed. *One application applied to affected area(s) of buttocks three times daily as needed for redness    Historical Provider, MD   Physical Exam: Filed Vitals:   07/09/12 1733  BP: 178/113  Pulse: 98  Temp: 97.9 F (36.6 C)  TempSrc: Oral  Resp: 16  Height: 5\' 5"  (1.651 m)  Weight: 49.2 kg (108 lb 7.5 oz)  SpO2: 98%     General:  Patient is lying in bed, she is drowsy, although she does awaken to voice. Does not appear to be in any distress  Eyes: Pupils are equal round react to light  ENT: Mucous membranes appear dry  Neck: Supple  Cardiovascular: S1, S2, regular rate and rhythm, 1-2+ pedal edema bilaterally up to the patient's thighs  Respiratory: Clear to auscultation bilaterally  Abdomen: Soft, nontender, nondistended, bowel sounds are active  Skin: Deferred  Musculoskeletal: Deferred  Psychiatric: Lethargic, confused  Neurologic: Grossly intact, nonfocal  Labs on Admission:  Basic Metabolic Panel:  Lab 07/06/12 8295  NA 145  K 4.8  CL 114*  CO2 23  GLUCOSE 96  BUN 66*  CREATININE 2.27*  CALCIUM 8.6  MG --  PHOS --   Liver Function Tests: No results found for this basename: AST:5,ALT:5,ALKPHOS:5,BILITOT:5,PROT:5,ALBUMIN:5 in the last 168 hours No results found for this basename: LIPASE:5,AMYLASE:5 in the last 168 hours No results found for this basename: AMMONIA:5 in the last 168 hours CBC:  Lab 07/06/12 0800  WBC 9.0  NEUTROABS 6.3  HGB 11.5*  HCT 35.1*  MCV 83.6  PLT 288   Cardiac Enzymes: No results found for this basename: CKTOTAL:5,CKMB:5,CKMBINDEX:5,TROPONINI:5 in the last 168 hours  BNP (last 3 results)  Basename 09/23/11 0813  PROBNP 872.9*   CBG: No results found for this basename: GLUCAP:5 in the last 168 hours  Radiological Exams on Admission: No results found.  Assessment/Plan Active  Problems:  Dementia  Hypothyroidism  CKD (chronic kidney disease) stage 4, GFR 15-29 ml/min  Hypertension  Encephalopathy  CHF, acute   1. Acute CHF. Patient does not have a prior history of cardiac disease. She does have evidence of volume overload. We will monitor the patient on telemetry, check an EKG. We will cycle cardiac markers and check a BNP. Patient will be started on IV Lasix. We will monitor strict I.'s and O.'s. She'll be started on a beta blocker. She's not a candidate for ACE inhibitors or ARB due to her renal insufficiency. We will check a 2-D echocardiogram to assess LV function. Continue aspirin. 2. Encephalopathy on dementia. Patient appears more lethargic than baseline. We'll check a urinalysis, TSH, ammonia. CT of the head was recently checked on 11/16 when she was lethargic at that time which did not  show any acute findings. It is possible that she could be lethargic from her metabolic derangements such as a uremic encephalopathy. We will need to check basic labs. 3. Hypertension. Continue outpatient medications, we will add Coreg and use when necessary hydralazine. 4. Chronic kidney disease stage IV. We'll need to monitor renal function closely while she is on diuretics. 5. Hypothyroidism. Continue supplementation, check TSH 6. Dementia. Continue outpatient medications.   Code Status: DO NOT RESUSCITATE, confirmed with family member and Dr. Margo Aye Family Communication: Discussed with her cousin who is her private hernia at the bedside  Disposition Plan: Discharge back to [facility] when she is back to baseline.  Time spent: 60 minutes  Ido Wollman [facility] Pager [phone]  If 7PM-7AM, please contact night-coverage [email or web address] Password [facility] 07/09/2012, 6:15 PM

## 2012-07-10 ENCOUNTER — Inpatient Hospital Stay (HOSPITAL_COMMUNITY): Payer: Medicare Other

## 2012-07-10 DIAGNOSIS — F0390 Unspecified dementia without behavioral disturbance: Secondary | ICD-10-CM

## 2012-07-10 DIAGNOSIS — I5033 Acute on chronic diastolic (congestive) heart failure: Principal | ICD-10-CM

## 2012-07-10 DIAGNOSIS — I509 Heart failure, unspecified: Secondary | ICD-10-CM

## 2012-07-10 LAB — BASIC METABOLIC PANEL
Chloride: 108 mEq/L (ref 96–112)
Creatinine, Ser: 2.22 mg/dL — ABNORMAL HIGH (ref 0.50–1.10)
GFR calc Af Amer: 21 mL/min — ABNORMAL LOW (ref >90)
GFR calc non Af Amer: 18 mL/min — ABNORMAL LOW (ref >90)
Potassium: 5 mEq/L (ref 3.5–5.1)

## 2012-07-10 LAB — TSH: TSH: 0.248 u[IU]/mL — ABNORMAL LOW (ref 0.350–4.500)

## 2012-07-10 LAB — TROPONIN I
Troponin I: <0.3 ng/mL (ref <0.30)
Troponin I: <0.3 ng/mL (ref <0.30)

## 2012-07-10 NOTE — Progress Notes (Signed)
[facility]             Progress Note   Subjective: Patient opens her eyes to voice and says yes and no to some questions, answers are not meaningful.  Objective: Vital signs in last 24 hours: Temp:  [97.5 F (36.4 C)-98 F (36.7 C)] 98 F (36.7 C) (11/20 0641) Pulse Rate:  [71-98] 89  (11/20 0641) Resp:  [16-20] 20  (11/20 0641) BP: (146-178)/(91-113) 146/91 mmHg (11/20 0641) SpO2:  [95 %-98 %] 97 % (11/20 0722) Weight:  [48.8 kg (107 lb 9.4 oz)-49.2 kg (108 lb 7.5 oz)] 48.8 kg (107 lb 9.4 oz) (11/20 0500) Weight change:  Last BM Date:  (unknown)  Intake/Output from previous day: 11/19 0701 - 11/20 0700 In: -  Out: 1600 [Urine:1600]     Physical Exam: General: Alert, awake, in no acute distress. HEENT: No bruits, no goiter. Heart: Regular rate and rhythm, without murmurs, rubs, gallops. Lungs: Diminished breath sounds at bases. Abdomen: Soft, nontender, nondistended, positive bowel sounds. Extremities: 1+ edema to the thigh. Bilaterally Neuro: Grossly intact, nonfocal.    Lab Results: Basic Metabolic Panel:  Basename 07/10/12 0221 07/09/12 1955  NA 138 142  K 5.0 4.6  CL 108 108  CO2 18* 22  GLUCOSE 93 92  BUN 65* 68*  CREATININE 2.22* 2.32*  CALCIUM 8.6 9.5  MG -- --  PHOS -- --   Liver Function Tests:  [facility] 07/09/12 1955  AST 20  ALT 15  ALKPHOS 86  BILITOT 0.2*  PROT 6.7  ALBUMIN 3.1*   No results found for this basename: LIPASE:2,AMYLASE:2 in the last 72 hours  Basename 07/09/12 1955  AMMONIA 24   CBC:  Basename 07/09/12 1955  WBC 11.4*  NEUTROABS 8.4*  HGB 13.8  HCT 41.8  MCV 83.8  PLT 290   Cardiac Enzymes:  Basename 07/10/12 0822 07/10/12 0145 07/09/12 1955  CKTOTAL -- -- --  CKMB -- -- --  CKMBINDEX -- -- --  TROPONINI <0.30 <0.30 <0.30   BNP:  Basename 07/09/12 1955  PROBNP 10439.0*   D-Dimer: No results found for this basename: DDIMER:2 in the last 72 hours CBG: No results found for this  basename: GLUCAP:6 in the last 72 hours Hemoglobin A1C: No results found for this basename: HGBA1C in the last 72 hours Fasting Lipid Panel: No results found for this basename: CHOL,HDL,LDLCALC,TRIG,CHOLHDL,LDLDIRECT in the last 72 hours Thyroid Function Tests: No results found for this basename: TSH,T4TOTAL,FREET4,T3FREE,THYROIDAB in the last 72 hours Anemia Panel: No results found for this basename: VITAMINB12,FOLATE,FERRITIN,TIBC,IRON,RETICCTPCT in the last 72 hours Coagulation: No results found for this basename: LABPROT:2,INR:2 in the last 72 hours Urine Drug Screen: Drugs of Abuse  No results found for this basename: labopia,  cocainscrnur,  labbenz,  amphetmu,  thcu,  labbarb    Alcohol Level: No results found for this basename: ETH:2 in the last 72 hours Urinalysis:  Basename 07/09/12 2030  COLORURINE YELLOW  LABSPEC 1.015  PHURINE 6.0  GLUCOSEU NEGATIVE  HGBUR NEGATIVE  BILIRUBINUR NEGATIVE  KETONESUR NEGATIVE  PROTEINUR 100*  UROBILINOGEN 0.2  NITRITE NEGATIVE  LEUKOCYTESUR NEGATIVE   No results found for this or any previous visit (from the past 240 hour(s)).  Studies/Results: Dg Chest Port 1 View  07/10/2012  *RADIOLOGY REPORT*  Clinical Data: Congestive heart failure, follow-up  PORTABLE CHEST - 1 VIEW  Comparison: Portable chest x-ray of 07/06/2012  Findings: There is little change in aeration.  Cardiomegaly is stable as is a left basilar opacity  most consistent with atelectasis and effusion.  Minimal atelectasis remains at the right lung base.  The bones are osteopenic.  IMPRESSION: No change in left basilar opacity most consistent with atelectasis and effusions.   Original Report Authenticated By: Dwyane Dee, M.D.     Medications: Scheduled Meds:    . amLODipine  10 mg Oral Daily  . aspirin EC  81 mg Oral Daily  . carvedilol  6.25 mg Oral BID WC  . enoxaparin  30 mg Subcutaneous Q24H  . furosemide  40 mg Intravenous Q12H  . [COMPLETED] influenza   inactive virus vaccine  0.5 mL Intramuscular Tomorrow-1000  . levothyroxine  100 mcg Oral Daily  . megestrol  400 mg Oral BID  . memantine  10 mg Oral BID  . mirtazapine  30 mg Oral QHS  . pantoprazole  40 mg Oral Daily  . polyethylene glycol  17 g Oral Daily  . rivastigmine  4.6 mg Transdermal Daily  . sertraline  25 mg Oral Daily  . sodium chloride  3 mL Intravenous Q12H  . [DISCONTINUED] potassium chloride  20 mEq Oral BID   Continuous Infusions:  PRN Meds:.sodium chloride, acetaminophen, hydrALAZINE, ondansetron (ZOFRAN) IV, sodium chloride  Assessment/Plan:  Principal Problem:  *Acute on chronic diastolic CHF (congestive heart failure) Active Problems:  Dementia  Hypothyroidism  CKD (chronic kidney disease) stage 4, GFR 15-29 ml/min  Hypertension  Encephalopathy  1. Acute on chronic diastolic congestive heart failure. Patient's been peptide was markedly elevated. 2-D echocardiogram shows preserved ejection fraction. She was started on IV Lasix. We'll continue to monitor monitor ins and outs while on Lasix as well as renal function. She's not a candidate for ACE inhibitors. She is already on a beta blocker.  2. Encephalopathy and dementia. Urinalysis is unremarkable, chest x-ray does not show pneumonia, TSH and ammonia are unremarkable. Possibly related to uremic encephalopathy. We'll continue to follow.  3. Hypertension. Started on Coreg. We'll continue to follow  4. Chronic kidney disease stage IV. Creatinine is stable. Continue to monitor with ongoing diuresis  5. Dementia, stable  Time spent coordinating care:   LOS: 1 day   MEMON,JEHANZEB [facility] Pager: [phone] 07/10/2012, 2:19 PM

## 2012-07-10 NOTE — Clinical Social Work Psychosocial (Signed)
Clinical Social Work Department BRIEF PSYCHOSOCIAL ASSESSMENT 07/10/2012  Patient:  Martha Pearson, Martha Pearson     Account Number:  [id number]     Admit date:  07/09/2012  Clinical Social Worker:  Nancie Neas  Date/Time:  07/10/2012 04:15 PM  Referred by:  Physician  Date Referred:  07/10/2012 Referred for  ALF Placement   Other Referral:   Interview type:  Family Other interview type:   Bonita Quin- cousin    PSYCHOSOCIAL DATA Living Status:  FACILITY Admitted from facility:  [facility] Level of care:  Assisted Living Primary support name:  Bonita Quin Primary support relationship to patient:  FAMILY Degree of support available:   supportive    CURRENT CONCERNS Current Concerns  Post-Acute Placement   Other Concerns:    SOCIAL WORK ASSESSMENT / PLAN CSW spoke with pt's guardian, Bonita Quin. She reports she is pt's cousin. Pt has dementia and was not alert during CSW visit. Pt has been a resident at [facility] for over two years. She is on the memory care unit. Family appear to be involved and supportive. Per Marchelle Folks at facility, pt requires extensive assist with ADLs. Bonita Quin indicates she still transports pt to appointments and would like to take pt back to [facility] at d/c if she is available. Facility okay for return as long as she remains at baseline. In house home health available at [facility]: [facility]   Assessment/plan status:  Psychosocial Support/Ongoing Assessment of Needs Other assessment/ plan:   Information/referral to community resources:   [employer]    PATIENT'S/FAMILY'S RESPONSE TO PLAN OF CARE: Pt unable to discuss plan of care. Family and facility agreeable to return to [facility] when medically stable. CSW to continue to follow.        Derenda Fennel, [place] [phone]

## 2012-07-10 NOTE — Progress Notes (Signed)
UR Chart Review Completed  

## 2012-07-10 NOTE — Progress Notes (Signed)
*  PRELIMINARY RESULTS* Echocardiogram 2D Echocardiogram has been performed.  Conrad Radcliffe 07/10/2012, 9:49 AM

## 2012-07-11 LAB — BASIC METABOLIC PANEL
BUN: 69 mg/dL — ABNORMAL HIGH (ref 6–23)
CO2: 26 mEq/L (ref 19–32)
Calcium: 9.1 mg/dL (ref 8.4–10.5)
GFR calc non Af Amer: 16 mL/min — ABNORMAL LOW (ref >90)
Glucose, Bld: 97 mg/dL (ref 70–99)

## 2012-07-11 LAB — CBC
Hemoglobin: 14.4 g/dL (ref 12.0–15.0)
MCH: 27.4 pg (ref 26.0–34.0)
MCHC: 33 g/dL (ref 30.0–36.0)
MCV: 83 fL (ref 78.0–100.0)
Platelets: 272 10*3/uL (ref 150–400)
RBC: 5.25 MIL/uL — ABNORMAL HIGH (ref 3.87–5.11)

## 2012-07-11 MED ORDER — SODIUM CHLORIDE 0.45 % IV SOLN
INTRAVENOUS | Status: DC
  Administered 2012-07-11 – 2012-07-12 (×3): via INTRAVENOUS

## 2012-07-11 NOTE — Progress Notes (Signed)
[facility]             Progress Note   Subjective: She opens her eyes to voice. Cannot contribute much in terms of history due to dementia.  Objective: Vital signs in last 24 hours: Temp:  [98.5 F (36.9 C)-99.3 F (37.4 C)] 99.3 F (37.4 C) (11/21 1055) Pulse Rate:  [76-96] 96  (11/21 1055) Resp:  [18-20] 18  (11/21 1055) BP: (147-159)/(85-97) 150/94 mmHg (11/21 1055) SpO2:  [96 %-98 %] 97 % (11/21 1055) Weight:  [49.9 kg (110 lb 0.2 oz)] 49.9 kg (110 lb 0.2 oz) (11/21 0416) Weight change: 0.7 kg (1 lb 8.7 oz) Last BM Date:  (unknown)  Intake/Output from previous day: 11/20 0701 - 11/21 0700 In: 252 [P.O.:240; IV Piggyback:12] Out: 2150 [Urine:2150] Total I/O In: 600 [P.O.:600] Out: -    Physical Exam: General: awake, in no acute distress. HEENT: No bruits, no goiter. Heart: Regular rate and rhythm, without murmurs, rubs, gallops. Lungs: Diminished breath sounds at bases. Abdomen: Soft, nontender, nondistended, positive bowel sounds. Extremities: 1+ edema to the thigh. Bilaterally, improving Neuro: Grossly intact, nonfocal.    Lab Results: Basic Metabolic Panel:  Basename 07/11/12 0501 07/10/12 0221  NA 138 138  K 4.8 5.0  CL 101 108  CO2 26 18*  GLUCOSE 97 93  BUN 69* 65*  CREATININE 2.45* 2.22*  CALCIUM 9.1 8.6  MG -- --  PHOS -- --   Liver Function Tests:  [facility] 07/09/12 1955  AST 20  ALT 15  ALKPHOS 86  BILITOT 0.2*  PROT 6.7  ALBUMIN 3.1*   No results found for this basename: LIPASE:2,AMYLASE:2 in the last 72 hours  Basename 07/09/12 1955  AMMONIA 24   CBC:  Basename 07/11/12 0501 07/09/12 1955  WBC 8.6 11.4*  NEUTROABS -- 8.4*  HGB 14.4 13.8  HCT 43.6 41.8  MCV 83.0 83.8  PLT 272 290   Cardiac Enzymes:  Basename 07/10/12 1338 07/10/12 0822 07/10/12 0145  CKTOTAL -- -- --  CKMB -- -- --  CKMBINDEX -- -- --  TROPONINI <0.30 <0.30 <0.30   BNP:  [facility] 07/09/12 1955  PROBNP 10439.0*   D-Dimer: No  results found for this basename: DDIMER:2 in the last 72 hours CBG: No results found for this basename: GLUCAP:6 in the last 72 hours Hemoglobin A1C: No results found for this basename: HGBA1C in the last 72 hours Fasting Lipid Panel: No results found for this basename: CHOL,HDL,LDLCALC,TRIG,CHOLHDL,LDLDIRECT in the last 72 hours Thyroid Function Tests:  Basename 07/09/12 1955  TSH 0.248*  T4TOTAL --  FREET4 --  T3FREE --  THYROIDAB --   Anemia Panel: No results found for this basename: VITAMINB12,FOLATE,FERRITIN,TIBC,IRON,RETICCTPCT in the last 72 hours Coagulation: No results found for this basename: LABPROT:2,INR:2 in the last 72 hours Urine Drug Screen: Drugs of Abuse  No results found for this basename: labopia,  cocainscrnur,  labbenz,  amphetmu,  thcu,  labbarb    Alcohol Level: No results found for this basename: ETH:2 in the last 72 hours Urinalysis:  Basename 07/09/12 2030  COLORURINE YELLOW  LABSPEC 1.015  PHURINE 6.0  GLUCOSEU NEGATIVE  HGBUR NEGATIVE  BILIRUBINUR NEGATIVE  KETONESUR NEGATIVE  PROTEINUR 100*  UROBILINOGEN 0.2  NITRITE NEGATIVE  LEUKOCYTESUR NEGATIVE   No results found for this or any previous visit (from the past 240 hour(s)).  Studies/Results: Dg Chest Port 1 View  07/10/2012  *RADIOLOGY REPORT*  Clinical Data: Congestive heart failure, follow-up  PORTABLE CHEST - 1 VIEW  Comparison:  Portable chest x-ray of 07/06/2012  Findings: There is little change in aeration.  Cardiomegaly is stable as is a left basilar opacity most consistent with atelectasis and effusion.  Minimal atelectasis remains at the right lung base.  The bones are osteopenic.  IMPRESSION: No change in left basilar opacity most consistent with atelectasis and effusions.   Original Report Authenticated By: Dwyane Dee, M.D.     Medications: Scheduled Meds:    . amLODipine  10 mg Oral Daily  . aspirin EC  81 mg Oral Daily  . carvedilol  6.25 mg Oral BID WC  . enoxaparin   30 mg Subcutaneous Q24H  . furosemide  40 mg Intravenous Q12H  . levothyroxine  100 mcg Oral Daily  . megestrol  400 mg Oral BID  . memantine  10 mg Oral BID  . mirtazapine  30 mg Oral QHS  . pantoprazole  40 mg Oral Daily  . polyethylene glycol  17 g Oral Daily  . rivastigmine  4.6 mg Transdermal Daily  . sertraline  25 mg Oral Daily  . sodium chloride  3 mL Intravenous Q12H   Continuous Infusions:  PRN Meds:.sodium chloride, acetaminophen, hydrALAZINE, ondansetron (ZOFRAN) IV, sodium chloride  Assessment/Plan:  Principal Problem:  *Acute on chronic diastolic CHF (congestive heart failure) Active Problems:  Dementia  Hypothyroidism  CKD (chronic kidney disease) stage 4, GFR 15-29 ml/min  Hypertension  Encephalopathy   Plan: This is an elderly lady who was directly admitted from her primary care physician's office, for worsening pedal edema, lethargy, and deteriorating renal function.  1. Acute on chronic diastolic congestive heart failure. Patient's BNP was markedly elevated on admission. 2-D echocardiogram shows preserved ejection fraction. She was started on IV Lasix. We'll continue to monitor ins and outs while on Lasix as well as renal function. She's not a candidate for ACE inhibitors. She is already on a beta blocker. She continues to diurese and her edema is improving.  2. Encephalopathy and dementia. Urinalysis is unremarkable, chest x-ray does not show pneumonia, TSH and ammonia are unremarkable. Possibly related to uremic encephalopathy. We'll continue to follow.  3. Hypertension. Started on Coreg. We'll continue to follow. Should improve further with diuresis.  4. Chronic kidney disease stage IV. Creatinine is trending up.  Will give gentle hydration to promote further diuresis and hopefully improve renal function  5. Abnormal Echo.  There is mention of a mobile density in RA which may be a chiari network.  Patient is afebrile and does not have evidence of  endocarditis. With her advanced age, advanced dementia, bed bound status and severe debility, I do not feel that further aggressive work up in her situation would be warranted.  6. Advanced Dementia, stable  7. Disposition. Eventually the patient and her family wish to return back to [facility].  Time spent coordinating care:   LOS: 2 days   MEMON,JEHANZEB [facility] Pager: [phone] 07/11/2012, 12:04 PM

## 2012-07-12 ENCOUNTER — Encounter (HOSPITAL_COMMUNITY): Payer: Self-pay | Admitting: Internal Medicine

## 2012-07-12 DIAGNOSIS — I517 Cardiomegaly: Secondary | ICD-10-CM

## 2012-07-12 LAB — CBC
MCH: 27.7 pg (ref 26.0–34.0)
MCV: 81.5 fL (ref 78.0–100.0)
Platelets: 262 10*3/uL (ref 150–400)
RBC: 4.48 MIL/uL (ref 3.87–5.11)
RDW: 15.1 % (ref 11.5–15.5)

## 2012-07-12 LAB — BASIC METABOLIC PANEL
CO2: 26 mEq/L (ref 19–32)
Calcium: 8.6 mg/dL (ref 8.4–10.5)
Creatinine, Ser: 2.3 mg/dL — ABNORMAL HIGH (ref 0.50–1.10)
GFR calc non Af Amer: 17 mL/min — ABNORMAL LOW (ref >90)
Glucose, Bld: 94 mg/dL (ref 70–99)
Sodium: 132 mEq/L — ABNORMAL LOW (ref 135–145)

## 2012-07-12 LAB — PRO B NATRIURETIC PEPTIDE: Pro B Natriuretic peptide (BNP): 5502 pg/mL — ABNORMAL HIGH (ref 0–450)

## 2012-07-12 MED ORDER — FUROSEMIDE 20 MG PO TABS
20.0000 mg | ORAL_TABLET | Freq: Two times a day (BID) | ORAL | Status: DC
  Administered 2012-07-13: 20 mg via ORAL
  Filled 2012-07-12: qty 1

## 2012-07-12 NOTE — Progress Notes (Signed)
Subjective: The patient is lying in bed. The patient's guardian, Mrs. Martha Pearson, is at her bedside. Questions answered. The patient is alert and gives eye contact, but she does not answer when addressed.  Objective: Vital signs in last 24 hours: Filed Vitals:   07/12/12 0500 07/12/12 0604 07/12/12 0735 07/12/12 1400  BP:  151/97 163/91 111/67  Pulse:  85 86 79  Temp:  98.1 F (36.7 C) 97.7 F (36.5 C) 97.7 F (36.5 C)  TempSrc:  Oral Axillary Oral  Resp:   12 16  Height:      Weight: 51 kg (112 lb 7 oz)     SpO2:  97% 95% 95%    Intake/Output Summary (Last 24 hours) at 07/12/12 1422 Last data filed at 07/12/12 1200  Gross per 24 hour  Intake 1641.75 ml  Output   1000 ml  Net 641.75 ml    Weight change: 1.1 kg (2 lb 6.8 oz)  Physical exam: General: Elderly 78 year old Caucasian woman lying in bed, in no acute distress. She is chronically demented. Lungs: Clear anteriorly with decreased breath sounds in the bases. Heart: S1, S2, with a soft systolic murmur. Abdomen: Positive bowel sounds, soft, nontender, nondistended. Extremities: Trace to 1+ bilateral lower extremity edema Neurologic: She is alert. She is mostly mute. Cranial nerves II through XII appear to be grossly intact..  Lab Results: Basic Metabolic Panel:  Basename 07/12/12 0511 07/11/12 0501  NA 132* 138  K 4.3 4.8  CL 97 101  CO2 26 26  GLUCOSE 94 97  BUN 67* 69*  CREATININE 2.30* 2.45*  CALCIUM 8.6 9.1  MG -- --  PHOS -- --   Liver Function Tests:  [facility] 07/09/12 1955  AST 20  ALT 15  ALKPHOS 86  BILITOT 0.2*  PROT 6.7  ALBUMIN 3.1*   No results found for this basename: LIPASE:2,AMYLASE:2 in the last 72 hours  Basename 07/09/12 1955  AMMONIA 24   CBC:  Basename 07/12/12 0511 07/11/12 0501 07/09/12 1955  WBC 7.8 8.6 --  NEUTROABS -- -- 8.4*  HGB 12.4 14.4 --  HCT 36.5 43.6 --  MCV 81.5 83.0 --  PLT 262 272 --   Cardiac Enzymes:  Basename 07/10/12 1338 07/10/12 0822 07/10/12  0145  CKTOTAL -- -- --  CKMB -- -- --  CKMBINDEX -- -- --  TROPONINI <0.30 <0.30 <0.30   BNP:  Basename 07/12/12 0511 07/09/12 1955  PROBNP 5502.0* 10439.0*   D-Dimer: No results found for this basename: DDIMER:2 in the last 72 hours CBG: No results found for this basename: GLUCAP:6 in the last 72 hours Hemoglobin A1C: No results found for this basename: HGBA1C in the last 72 hours Fasting Lipid Panel: No results found for this basename: CHOL,HDL,LDLCALC,TRIG,CHOLHDL,LDLDIRECT in the last 72 hours Thyroid Function Tests:  Basename 07/09/12 1955  TSH 0.248*  T4TOTAL --  FREET4 --  T3FREE --  THYROIDAB --   Anemia Panel: No results found for this basename: VITAMINB12,FOLATE,FERRITIN,TIBC,IRON,RETICCTPCT in the last 72 hours Coagulation: No results found for this basename: LABPROT:2,INR:2 in the last 72 hours Urine Drug Screen: Drugs of Abuse  No results found for this basename: labopia, cocainscrnur, labbenz, amphetmu, thcu, labbarb    Alcohol Level: No results found for this basename: ETH:2 in the last 72 hours Urinalysis:  Basename 07/09/12 2030  COLORURINE YELLOW  LABSPEC 1.015  PHURINE 6.0  GLUCOSEU NEGATIVE  HGBUR NEGATIVE  BILIRUBINUR NEGATIVE  KETONESUR NEGATIVE  PROTEINUR 100*  UROBILINOGEN 0.2  NITRITE NEGATIVE  LEUKOCYTESUR  NEGATIVE   Misc. Labs:   Micro: No results found for this or any previous visit (from the past 240 hour(s)).  Studies/Results:  2-D echocardiogram:Study Conclusions  - Left ventricle: The cavity size was normal. Wall thickness was increased in a pattern of moderate LVH. There was mild focal basal hypertrophy of the septum. Systolic function was normal. The estimated ejection fraction was in the range of 60% to 65%. Wall motion was normal; there were no regional wall motion abnormalities. Doppler parameters are consistent with abnormal left ventricular relaxation (grade 1 diastolic dysfunction). - Aortic valve: Trivial  regurgitation. - Mitral valve: Calcified annulus. There was moderate systolic anterior motion of the anterior leaflet, posterior leaflet, and chordal structures. - Pericardium, extracardiac: A trivial pericardial effusion was identified. Impressions:  - Technically difficult; normal LV function; proximal septal thickening and MV SAM; gradient not well interrogated but resting appears to be approximately 2.5 m/s; mobile density in RA may be a chiari network; suggest TEE to better assess if clinically indicated. Transthoracic echocardiography. M-mode, complete 2D, spectral Doppler, and color Doppler. Height: Height: 165.1cm. Height: 65in. Weight: Weight: 48.5kg. Weight: 106.8lb. Body mass index: BMI: 17.8kg/m^2. Body surface area: BSA: 1.30m^2. Patient status: Inpatient. Location: Bedside.        Medications:  Scheduled:   . amLODipine  10 mg Oral Daily  . aspirin EC  81 mg Oral Daily  . carvedilol  6.25 mg Oral BID WC  . enoxaparin  30 mg Subcutaneous Q24H  . furosemide  40 mg Intravenous Q12H  . levothyroxine  100 mcg Oral Daily  . megestrol  400 mg Oral BID  . memantine  10 mg Oral BID  . mirtazapine  30 mg Oral QHS  . pantoprazole  40 mg Oral Daily  . polyethylene glycol  17 g Oral Daily  . rivastigmine  4.6 mg Transdermal Daily  . sertraline  25 mg Oral Daily  . sodium chloride  3 mL Intravenous Q12H   Continuous:   . sodium chloride 75 mL/hr at 07/12/12 0243   ZOX:WRUEAV chloride, acetaminophen, hydrALAZINE, ondansetron (ZOFRAN) IV, sodium chloride  Assessment: Principal Problem:  *Acute on chronic diastolic CHF (congestive heart failure) Active Problems:  Dementia  Hypothyroidism  CKD (chronic kidney disease) stage 4, GFR 15-29 ml/min  Hypertension  Encephalopathy    Dr. Benetta Spar note reviewed. Laboratory data, procedure/test results, vital signs, etc. all reviewed. Plan of care and results thus far discussed with Mrs. Martha Pearson. At this point, it  appears that the patient's creatinine/renal function is stabilizing. Her new creatinine baseline is likely going to be approximately 2.5. Her urine output is excellent on IV fluids and Lasix. Apparently, her peripheral edema has significantly decreased. Her proBNP has decreased by half. Her encephalopathy is likely related to dementia, but an underlying cerebral ischemic event cannot be completely ruled out. She cannot have an MRI due to previous surgeries that involved mechanical screws. Would prefer no escalation of further studies and decreasing IV fluids and Lasix. Mrs. Lamberth agreed.  Plan:  1. Discontinue IV Lasix. Change Lasix to 20 mg by mouth twice a day in the morning. 2. KVO IV fluids. 3. Continue supportive treatment. 4. We'll check a free T4 as her TSH is slightly low. 5. Disposition: Likely discharge back to [facility].   LOS: 3 days   Shuntia Exton 07/12/2012, 2:22 PM

## 2012-07-12 NOTE — Clinical Social Work Note (Addendum)
Per MD, probable d/c tomorrow. [employer] notified and agreeable. Signed FL2 and DNR on chart. Weekend CSW also aware in order to reconcile FL2.  Derenda Fennel, [place] [phone]

## 2012-07-13 ENCOUNTER — Encounter (HOSPITAL_COMMUNITY): Payer: Self-pay | Admitting: Internal Medicine

## 2012-07-13 LAB — BASIC METABOLIC PANEL
BUN: 65 mg/dL — ABNORMAL HIGH (ref 6–23)
Calcium: 8.5 mg/dL (ref 8.4–10.5)
Creatinine, Ser: 2.39 mg/dL — ABNORMAL HIGH (ref 0.50–1.10)
GFR calc Af Amer: 19 mL/min — ABNORMAL LOW (ref >90)
GFR calc non Af Amer: 17 mL/min — ABNORMAL LOW (ref >90)
Glucose, Bld: 93 mg/dL (ref 70–99)

## 2012-07-13 MED ORDER — FUROSEMIDE 20 MG PO TABS: 20.0000 mg | ORAL_TABLET | Freq: Every day | ORAL | Status: DC

## 2012-07-13 MED ORDER — ACETAMINOPHEN 325 MG PO TABS: 650.0000 mg | ORAL_TABLET | Freq: Four times a day (QID) | ORAL | Status: AC | PRN

## 2012-07-13 MED ORDER — CARVEDILOL 6.25 MG PO TABS: 6.2500 mg | ORAL_TABLET | Freq: Two times a day (BID) | ORAL | Status: AC

## 2012-07-13 NOTE — Progress Notes (Signed)
Patient with orders to be discharge back to [facility]. Patient is mute, mumbles at times, unable to follow commands. Unable to educate patient d/t cognitive. Report called to [facility] to [place]. Patient in stable condition upon discharge. Patient's guardian is transporting patient back to [facility].

## 2012-07-13 NOTE — Discharge Summary (Addendum)
Physician Discharge Summary  Martha Pearson YQM:[id number] DOB: 17-Dec-1917 DOA: 07/09/2012  PCP: Dwana Melena, MD  Admit date: 07/09/2012 Discharge date: 07/13/2012  Time spent:  Greater than  Recommendations for Outpatient Follow-up:  1. The patient needs an appointment to followup with Dr. Dwana Melena in one to 2 weeks. 2. The patient needs assistance with all meals.  Discharge Diagnoses:   1. Lethargy/encephalopathy, secondary to progressive dementia, progressive renal failure, malignant hypertension, and acute on chronic diastolic heart failure. 2. Acute on chronic diastolic congestive heart failure manifested as i edema.. She diuresed over 5 L during the hospitalization. 3. Stage IV chronic kidney disease. New baseline creatinine appears to be approximately 2.3-2.5. 4. Left ventricular hypertrophy, per 2-D echocardiogram. Ejection fraction 60-65%. 5. Malignant hypertension. 6. Hypothyroidism. The patient's TSH was slightly low at 0.24, but her free T4 was 1.43, within normal limits. 7. Progressive dementia. 8. DO NOT RESUSCITATE status.  Discharge Condition: Stable.  Diet recommendation: Heart healthy. Asst. needed with each meal.  Filed Weights   07/11/12 0416 07/12/12 0500 07/13/12 0700  Weight: 49.9 kg (110 lb 0.2 oz) 51 kg (112 lb 7 oz) 50.5 kg (111 lb 5.3 oz)    History of present illness:  The patient is a 78 year old woman with a history significant for dementia, hypertension, and stage IV chronic kidney disease, who presented to the hospital on 07/09/2012 with a complaint of lethargy and lower extremity swelling. During the initial evaluation, she was hypertensive with a blood pressure 178/113. Her lab data were significant for a BUN of 68, creatinine of 2.32, normal troponin I., pro BNP of 10,439, and a CBC of 11.4. During the emergency department evaluation on 07/06/2012, her urinalysis was unremarkable. CT of her head revealed no acute intracranial abnormalities.  Her EKG revealed normal sinus rhythm and a right bundle branch block. Her chest x-ray revealed no acute cardiopulmonary findings. She was admitted to the hospital on 07/09/2012 for further evaluation and management.  Hospital Course:  Given the lower extremity edema and proBNP of greater than 10,000, she was started on treatment for acute congestive heart failure. IV Lasix was initiated. For better blood pressure control, carvedilol was added. She was maintained on amlodipine. ACE inhibitor or an ARB was not started due to her renal insufficiency. A 2-D echocardiogram was ordered for evaluation. It revealed grade 1 diastolic dysfunction, moderate LVH, an ejection fraction of 60-65%. Therefore, the patient was thought to have acute on chronic diastolic dysfunction/heart failure. For further evaluation of lethargy/encephalopathy, a number of studies were ordered. Her TSH was slightly low, however, her free T4 was within normal limits. Her troponin I was negative x4. Her ammonia level was within normal limits at 24. The results of her 2-D echocardiogram were reported above.  The patient's baseline creatinine was apparently approximately 1.8-1.9. However, when she was admitted, her creatinine was 2.32 and her BUN was 68. There was a possibility that the patient may have had uremic encephalopathy, however, based on the clinical findings, it was unlikely that she had uremic encephalopathy, but rather encephalopathy from progressive dementia. The acute on chronic diastolic heart failure and malignant hypertension could have certainly play a role. An acute cerebral ischemic event could not be completely ruled out, however, because of her age and previous surgical hardware, it was decided that an MRI and carotid ultrasound would not be ordered. I discussed this with her guardian, [name]. She was in agreement with this.  She was started on IV fluids along with Lasix  when it appeared that she became a little dry.  Her urine output was excellent. She actually diuresed over 5 L during the hospitalization. The lower extremity edema decreased significantly. Her proBNP decreased from greater than 10,000 to a little over 5000. Eventually, the IV fluids were tapered off and her Lasix was changed to once daily orally.  The patient ate fairly well during the hospitalization. Her blood pressure improved. She remained mostly mute, but alert. There were no obvious serious cranial nerve deficits. It appears that she has progressive dementia and it is likely that she will continue to deteriorate over time due to to the nature of the disease. She was confirmed to be DO NOT RESUSCITATE  by [name].  A Foley catheter was inserted during the hospitalization. It was discontinued prior to discharge.  Procedures:  None  Consultations:  None  Discharge Exam: Filed Vitals:   07/12/12 1400 07/12/12 2042 07/13/12 0500 07/13/12 0700  BP: 111/67 122/84 139/74   Pulse: 79 76 90   Temp: 97.7 F (36.5 C) 98.2 F (36.8 C) 98.5 F (36.9 C)   TempSrc: Oral Oral Oral   Resp: 16 16 18    Height:      Weight:    50.5 kg (111 lb 5.3 oz)  SpO2: 95% 96% 97%     General: elderly 78 year old Caucasian woman laying in bed, in no acute distress. Cardiovascular: S1, S2, with no murmurs rubs or gallops. Respiratory: clear to auscultation bilaterally. Neurologic: She is alert, she gives good eye contact, she does not follow directions, she tries to speak but does not.  Discharge Instructions  Discharge Orders    Future Orders Please Complete By Expires   Diet - low sodium heart healthy      Increase activity slowly      Discharge instructions      Comments:   The patient will need to followup with Dr. Margo Aye in the next week or 2. An appointment should be [facility] from the [facility]       Medication List     As of 07/13/2012 10:26 AM    TAKE these medications         acetaminophen 325 MG tablet   Commonly known as:  TYLENOL   Take 2 tablets (650 mg total) by mouth every 6 (six) hours as needed for fever.      amLODipine 10 MG tablet   Commonly known as: NORVASC   Take 10 mg by mouth daily.      aspirin 81 MG EC tablet   Take 81 mg by mouth daily.      calcium carbonate 600 MG Tabs   Commonly known as: OS-CAL   Take 600 mg by mouth 3 (three) times daily.      carvedilol 6.25 MG tablet   Commonly known as: COREG   Take 1 tablet (6.25 mg total) by mouth 2 (two) times daily with a meal.      cholecalciferol 1000 UNITS tablet   Commonly known as: VITAMIN D   Take 1,000 Units by mouth 2 (two) times daily.      ENSURE   Take 237 mLs by mouth 3 (three) times daily.      furosemide 20 MG tablet   Commonly known as: LASIX   Take 1 tablet (20 mg total) by mouth daily.      levothyroxine 100 MCG tablet   Commonly known as: SYNTHROID, LEVOTHROID   Take 100 mcg by mouth daily.  megestrol 40 MG/ML suspension   Commonly known as: MEGACE   Take 400 mg by mouth 2 (two) times daily.      memantine 10 MG tablet   Commonly known as: NAMENDA   Take 10 mg by mouth 2 (two) times daily.      mirtazapine 30 MG tablet   Commonly known as: REMERON   Take 30 mg by mouth at bedtime.      omeprazole 20 MG capsule   Commonly known as: PRILOSEC   Take 20 mg by mouth daily.      polyethylene glycol packet   Commonly known as: MIRALAX / GLYCOLAX   Take 17 g by mouth daily. constipation      rivastigmine 4.6 mg/24hr   Commonly known as: EXELON   Place 1 patch onto the skin daily.      sertraline 25 MG tablet   Commonly known as: ZOLOFT   Take 25 mg by mouth daily.      zinc oxide 20 % ointment   Apply 1 application topically 3 (three) times daily as needed. *One application applied to affected area(s) of buttocks three times daily as needed for redness          The results of significant diagnostics from this hospitalization (including imaging, microbiology, ancillary and laboratory) are  listed below for reference.    Significant Diagnostic Studies: Ct Head Wo Contrast  07/06/2012  *RADIOLOGY REPORT*  Clinical Data: Weakness.  Altered level of consciousness.  CT HEAD WITHOUT CONTRAST  Technique:  Contiguous axial images were obtained from the base of the skull through the vertex without contrast.  Comparison: Head CT 05/16/2012.  Findings: Again noted is moderate cerebral and cerebellar atrophy. There are extensive patchy and confluent areas of decreased attenuation throughout the deep and periventricular white matter of the cerebral hemispheres bilaterally, compatible with advanced chronic microvascular ischemic disease. Old lacunar infarctions are noted in the left basal ganglia.  Physiologic calcifications of the basal ganglia bilaterally.  No acute displaced skull fractures are identified.  Visualized paranasal sinuses and mastoids are generally well pneumatized, with exception of extensive mucoperiosteal thickening and chronic inspissated secretions within the right maxillary sinus, and mild mucosal thickening in the left maxillary sinus.  IMPRESSION: 1.  No acute intracranial abnormalities. 2.  The appearance the brain is unchanged compared to 05/16/2012. 3.  Chronic sinusitis in the right maxillary sinus redemonstrated.   Original Report Authenticated By: Trudie Reed, M.D.    Dg Chest Port 1 View  07/10/2012  *RADIOLOGY REPORT*  Clinical Data: Congestive heart failure, follow-up  PORTABLE CHEST - 1 VIEW  Comparison: Portable chest x-ray of 07/06/2012  Findings: There is little change in aeration.  Cardiomegaly is stable as is a left basilar opacity most consistent with atelectasis and effusion.  Minimal atelectasis remains at the right lung base.  The bones are osteopenic.  IMPRESSION: No change in left basilar opacity most consistent with atelectasis and effusions.   Original Report Authenticated By: Dwyane Dee, M.D.    Dg Chest Port 1 View  07/06/2012  *RADIOLOGY REPORT*   Clinical Data: Weakness  PORTABLE CHEST - 1 VIEW  Comparison: 05/16/2012  Findings: The cardiac silhouette is normal in size.  No mediastinal or hilar mass or adenopathy.  Increased lung markings, likely a combination scarring and subsegmental atelectasis.  No acute findings in the lungs.  No pneumothorax.  Bony thorax is demineralized with an old, healed fracture of the proximal left humerus.  IMPRESSION: No acute cardiopulmonary  disease.   Original Report Authenticated By: Amie Portland, M.D.     Microbiology: No results found for this or any previous visit (from the past 240 hour(s)).   Labs: Basic Metabolic Panel:  Lab 07/13/12 8469 07/12/12 0511 07/11/12 0501 07/10/12 0221 07/09/12 1955  NA 135 132* 138 138 142  K 4.3 4.3 4.8 5.0 4.6  CL 100 97 101 108 108  CO2 25 26 26  18* 22  GLUCOSE 93 94 97 93 92  BUN 65* 67* 69* 65* 68*  CREATININE 2.39* 2.30* 2.45* 2.22* 2.32*  CALCIUM 8.5 8.6 9.1 8.6 9.5  MG -- -- -- -- --  PHOS -- -- -- -- --   Liver Function Tests:  Lab 07/09/12 1955  AST 20  ALT 15  ALKPHOS 86  BILITOT 0.2*  PROT 6.7  ALBUMIN 3.1*   No results found for this basename: LIPASE:5,AMYLASE:5 in the last 168 hours  Lab 07/09/12 1955  AMMONIA 24   CBC:  Lab 07/12/12 0511 07/11/12 0501 07/09/12 1955  WBC 7.8 8.6 11.4*  NEUTROABS -- -- 8.4*  HGB 12.4 14.4 13.8  HCT 36.5 43.6 41.8  MCV 81.5 83.0 83.8  PLT 262 272 290   Cardiac Enzymes:  Lab 07/10/12 1338 07/10/12 0822 07/10/12 0145 07/09/12 1955  CKTOTAL -- -- -- --  CKMB -- -- -- --  CKMBINDEX -- -- -- --  TROPONINI <0.30 <0.30 <0.30 <0.30   BNP: BNP (last 3 results)  Basename 07/12/12 0511 07/09/12 1955 09/23/11 0813  PROBNP 5502.0* 10439.0* 872.9*   CBG:  Lab 07/12/12 2041  GLUCAP 117*       Signed:  Margarete Horace  [facility] 07/13/2012, 10:26 AM

## 2012-09-26 ENCOUNTER — Encounter (HOSPITAL_COMMUNITY): Payer: Self-pay | Admitting: Emergency Medicine

## 2012-09-26 ENCOUNTER — Emergency Department (HOSPITAL_COMMUNITY)
Admission: EM | Admit: 2012-09-26 | Discharge: 2012-09-26 | Disposition: A | Discharge status: Skilled Nursing Facility | Payer: Medicare Other | Attending: Emergency Medicine | Admitting: Emergency Medicine

## 2012-09-26 ENCOUNTER — Emergency Department (HOSPITAL_COMMUNITY): Payer: Medicare Other

## 2012-09-26 DIAGNOSIS — I129 Hypertensive chronic kidney disease with stage 1 through stage 4 chronic kidney disease, or unspecified chronic kidney disease: Secondary | ICD-10-CM | POA: Insufficient documentation

## 2012-09-26 DIAGNOSIS — Z7982 Long term (current) use of aspirin: Secondary | ICD-10-CM | POA: Insufficient documentation

## 2012-09-26 DIAGNOSIS — N19 Unspecified kidney failure: Secondary | ICD-10-CM

## 2012-09-26 DIAGNOSIS — Z8679 Personal history of other diseases of the circulatory system: Secondary | ICD-10-CM | POA: Insufficient documentation

## 2012-09-26 DIAGNOSIS — Z8781 Personal history of (healed) traumatic fracture: Secondary | ICD-10-CM | POA: Insufficient documentation

## 2012-09-26 DIAGNOSIS — M810 Age-related osteoporosis without current pathological fracture: Secondary | ICD-10-CM | POA: Insufficient documentation

## 2012-09-26 DIAGNOSIS — N184 Chronic kidney disease, stage 4 (severe): Secondary | ICD-10-CM | POA: Insufficient documentation

## 2012-09-26 DIAGNOSIS — F0280 Dementia in other diseases classified elsewhere without behavioral disturbance: Secondary | ICD-10-CM | POA: Insufficient documentation

## 2012-09-26 DIAGNOSIS — Z853 Personal history of malignant neoplasm of breast: Secondary | ICD-10-CM | POA: Insufficient documentation

## 2012-09-26 DIAGNOSIS — I5033 Acute on chronic diastolic (congestive) heart failure: Secondary | ICD-10-CM | POA: Insufficient documentation

## 2012-09-26 DIAGNOSIS — Z79899 Other long term (current) drug therapy: Secondary | ICD-10-CM | POA: Insufficient documentation

## 2012-09-26 DIAGNOSIS — E039 Hypothyroidism, unspecified: Secondary | ICD-10-CM | POA: Insufficient documentation

## 2012-09-26 DIAGNOSIS — G309 Alzheimer's disease, unspecified: Secondary | ICD-10-CM | POA: Insufficient documentation

## 2012-09-26 DIAGNOSIS — F0390 Unspecified dementia without behavioral disturbance: Secondary | ICD-10-CM

## 2012-09-26 LAB — CBC WITH DIFFERENTIAL/PLATELET
Basophils Relative: 1 % (ref 0–1)
Eosinophils Absolute: 0.1 10*3/uL (ref 0.0–0.7)
HCT: 36 % (ref 36.0–46.0)
Hemoglobin: 11.3 g/dL — ABNORMAL LOW (ref 12.0–15.0)
MCH: 28.5 pg (ref 26.0–34.0)
MCHC: 31.4 g/dL (ref 30.0–36.0)
Monocytes Absolute: 0.4 10*3/uL (ref 0.1–1.0)
Monocytes Relative: 6 % (ref 3–12)

## 2012-09-26 LAB — URINALYSIS, ROUTINE W REFLEX MICROSCOPIC
Bilirubin Urine: NEGATIVE
Glucose, UA: NEGATIVE mg/dL
Ketones, ur: NEGATIVE mg/dL
pH: 6.5 (ref 5.0–8.0)

## 2012-09-26 LAB — BASIC METABOLIC PANEL
BUN: 75 mg/dL — ABNORMAL HIGH (ref 6–23)
Chloride: 107 mEq/L (ref 96–112)
Creatinine, Ser: 2.56 mg/dL — ABNORMAL HIGH (ref 0.50–1.10)
GFR calc Af Amer: 18 mL/min — ABNORMAL LOW (ref >90)
GFR calc non Af Amer: 15 mL/min — ABNORMAL LOW (ref >90)

## 2012-09-26 LAB — URINE MICROSCOPIC-ADD ON

## 2012-09-26 MED ORDER — FUROSEMIDE 20 MG PO TABS: 20.0000 mg | ORAL_TABLET | Freq: Two times a day (BID) | ORAL | Status: DC

## 2012-09-26 NOTE — ED Notes (Signed)
Spoke with Marchelle Folks at [facility] She states the patient was sent by Dr Margo Aye for a nephrology consult due to weight gain and creatinine and bun being elevated. Reports patient has been on 20 mg Lasix BID for several days without change (pateint continued to gain weight). Dr Bebe Shaggy made aware and states he was not contacted by Dr Margo Aye.

## 2012-09-26 NOTE — ED Notes (Signed)
Patient redressed by ED staff x 2. Awaiting [facility] for transport back to facility.

## 2012-09-26 NOTE — ED Notes (Signed)
IV access attempted x 2 by this RN and x 2 by Darel Hong, RN. Unsuccessful. Lab at bedside for blood draw.

## 2012-09-26 NOTE — ED Notes (Signed)
In and out catheter performed via sterile technique. Patient tolerated well.

## 2012-09-26 NOTE — ED Notes (Signed)
Patient arrives via EMS from [place] with c/o altered mental status that started two days ago. Staff reports weight gain of 2 lbs. Patient is on the alzheimer's unit at [facility].

## 2012-09-26 NOTE — ED Provider Notes (Signed)
History   This chart was scribed for Martha Gaskins, MD, by Frederik Pear, ER scribe. The patient was seen in room APA18/APA18 and the patient's care was started at 1308.    CSN: [id number]  Arrival date & time 09/26/12  1200   First MD Initiated Contact with Patient 09/26/12 1308      Chief Complaint  Patient presents with  . Altered Mental Status     HPI  Martha Pearson is a 79 y.o. female brought in by EMS who presents to the Emergency Department with an altered mental status. She is a resident at [facility] on the alzheimer's unit with a h/o of dementia, but is normally awake and talkative. They report gradually worsening altered mental status that began 2 days ago. The pt is a Level 5 Caveat for altered mental status. She is a DNR. No falls are reported    Past Medical History  Diagnosis Date  . Hypertension   . Thyroid disease     hypothyroid  . Cancer     breast  . Hypothyroid   . Alzheimer disease   . Osteoporosis   . Hip fracture   . LVH (left ventricular hypertrophy)   . Acute on chronic diastolic CHF (congestive heart failure) 07/09/2012.  Marland Kitchen CKD (chronic kidney disease) stage 4, GFR 15-29 ml/min 09/23/2011    Past Surgical History  Procedure Date  . Mastectomy     right    No family history on file.  History  Substance Use Topics  . Smoking status: Never Smoker   . Smokeless tobacco: Never Used  . Alcohol Use: No    OB History    Grav Para Term Preterm Abortions TAB SAB Ect Mult Living                  Review of Systems  Unable to perform ROS: Mental status change    Allergies  Ace inhibitors; Penicillins; and Sulfa antibiotics  Home Medications   Current Outpatient Rx  Name  Route  Sig  Dispense  Refill  . AMLODIPINE BESYLATE 10 MG PO TABS   Oral   Take 10 mg by mouth daily.           . ASPIRIN 81 MG PO TBEC   Oral   Take 81 mg by mouth daily.           Marland Kitchen CALCIUM CARBONATE 600 MG PO TABS   Oral   Take 600 mg by mouth 3  (three) times daily.           Marland Kitchen CARVEDILOL 6.25 MG PO TABS   Oral   Take 1 tablet (6.25 mg total) by mouth 2 (two) times daily with a meal.         . VITAMIN D 1000 UNITS PO TABS   Oral   Take 1,000 Units by mouth 2 (two) times daily.         Marland Kitchen ENSURE PO LIQD   Oral   Take 237 mLs by mouth 3 (three) times daily.         . FUROSEMIDE 20 MG PO TABS   Oral   Take 1 tablet (20 mg total) by mouth daily.   30 tablet      . LEVOTHYROXINE SODIUM 75 MCG PO TABS   Oral   Take 75 mcg by mouth daily.         . MEGESTROL ACETATE 40 MG/ML PO SUSP   Oral   Take 400  mg by mouth 2 (two) times daily.         Marland Kitchen MEMANTINE HCL ER 28 MG PO CP24   Oral   Take 28 mg by mouth daily.         Marland Kitchen MIRTAZAPINE 15 MG PO TABS   Oral   Take 15 mg by mouth at bedtime.         . OMEPRAZOLE 20 MG PO CPDR   Oral   Take 20 mg by mouth daily.         Marland Kitchen POLYETHYLENE GLYCOL 3350 PO PACK   Oral   Take 17 g by mouth daily. constipation         . RIVASTIGMINE 4.6 MG/24HR TD PT24   Transdermal   Place 1 patch onto the skin daily.           . SERTRALINE HCL 25 MG PO TABS   Oral   Take 25 mg by mouth daily.           . ACETAMINOPHEN 325 MG PO TABS   Oral   Take 2 tablets (650 mg total) by mouth every 6 (six) hours as needed for fever.         Marland Kitchen ZINC OXIDE 20 % EX OINT   Topical   Apply 1 application topically 3 (three) times daily as needed. *One application applied to affected area(s) of buttocks three times daily as needed for redness           BP 145/70  Pulse 81  Temp 98.6 F (37 C) (Rectal)  Resp 19  Wt 110 lb (49.896 kg)  SpO2 98%  Physical Exam CONSTITUTIONAL: frail, elderly HEAD AND FACE: Normocephalic/atraumatic EYES: EOMI/PERRL ENMT: Mucous membranes moist NECK: supple no meningeal signs SPINE:No bruising/crepitance/stepoffs noted to spine CV: S1/S2 noted, no murmurs/rubs/gallops noted LUNGS: Lungs are clear to auscultation bilaterally, no apparent  distress ABDOMEN: soft, nontender, no rebound or guarding GU:no cva tenderness NEURO: moves all extremitiesx4, awake and alert, but appears confused and does not answer all questions.  EXTREMITIES: pulses normal, full ROM, no deformity or tenderness noted to any extremity SKIN: warm, color normal PSYCH: no abnormalities of mood noted  ED Course  Procedures   DIAGNOSTIC STUDIES: Oxygen Saturation is 98% on room air, normal by my interpretation.    COORDINATION OF CARE:  13:13- Performed physical exam on pt and placed orders for a head CT without contrast, EKG, and chest X-ray.   2:23 PM Labs at baseline.  Ct head ?NPH but can f/u as outpatient.  I doubt she has acute pneumonia after review of CXR And her clinical picture.  She is in no distress.  She is sleeping but easily arousable, no distress, no focal weakness noted   Labs Reviewed  URINALYSIS, ROUTINE W REFLEX MICROSCOPIC - Abnormal; Notable for the following:    Protein, ur >300 (*)     All other components within normal limits  URINE MICROSCOPIC-ADD ON - Abnormal; Notable for the following:    Casts HYALINE CASTS (*)     All other components within normal limits  CBC WITH DIFFERENTIAL  BASIC METABOLIC PANEL      MDM  Nursing notes including past medical history and social history reviewed and considered in documentation Labs/vital reviewed and considered xrays reviewed and considered     Date: 09/26/2012  Rate: 75  Rhythm: normal sinus rhythm  QRS Axis: left  Intervals: normal  ST/T Wave abnormalities: nonspecific ST changes  Conduction Disutrbances:nonspecific intraventricular conduction  delay  Narrative Interpretation:   Old EKG Reviewed: unchanged    I personally performed the services described in this documentation, which was scribed in my presence. The recorded information has been reviewed and is accurate.          Martha Gaskins, MD 09/26/12 1425

## 2012-09-26 NOTE — ED Notes (Signed)
[facility] called for patient discharge. They are going to send someone to pick her up. Awaiting ride.

## 2012-09-26 NOTE — ED Notes (Signed)
Report given to [facility]. Spoke with Marchelle Folks.

## 2012-09-26 NOTE — ED Notes (Addendum)
Dr Margo Aye spoke with Dr Bebe Shaggy, wanted to admit because patient's kidney function tests are worse than normal. Dr Margo Aye to work on admission per Dr Bebe Shaggy. Marchelle Folks at [facility] aware.

## 2012-10-17 ENCOUNTER — Other Ambulatory Visit (HOSPITAL_COMMUNITY): Payer: Self-pay | Admitting: Nephrology

## 2012-10-17 DIAGNOSIS — N289 Disorder of kidney and ureter, unspecified: Secondary | ICD-10-CM

## 2012-10-23 ENCOUNTER — Ambulatory Visit (HOSPITAL_COMMUNITY): Payer: Medicare Other

## 2012-10-28 ENCOUNTER — Other Ambulatory Visit (HOSPITAL_COMMUNITY): Payer: Medicare Other

## 2013-04-13 ENCOUNTER — Encounter (HOSPITAL_COMMUNITY): Payer: Self-pay | Admitting: *Deleted

## 2013-04-13 ENCOUNTER — Emergency Department (HOSPITAL_COMMUNITY)
Admission: EM | Admit: 2013-04-13 | Discharge: 2013-04-14 | Disposition: A | Discharge status: Skilled Nursing Facility | Payer: Medicare Other | Attending: Emergency Medicine | Admitting: Emergency Medicine

## 2013-04-13 DIAGNOSIS — W19XXXA Unspecified fall, initial encounter: Secondary | ICD-10-CM

## 2013-04-13 DIAGNOSIS — Z8679 Personal history of other diseases of the circulatory system: Secondary | ICD-10-CM | POA: Insufficient documentation

## 2013-04-13 DIAGNOSIS — Y9389 Activity, other specified: Secondary | ICD-10-CM | POA: Insufficient documentation

## 2013-04-13 DIAGNOSIS — T07XXXA Unspecified multiple injuries, initial encounter: Secondary | ICD-10-CM

## 2013-04-13 DIAGNOSIS — N184 Chronic kidney disease, stage 4 (severe): Secondary | ICD-10-CM | POA: Insufficient documentation

## 2013-04-13 DIAGNOSIS — Z7982 Long term (current) use of aspirin: Secondary | ICD-10-CM | POA: Insufficient documentation

## 2013-04-13 DIAGNOSIS — W06XXXA Fall from bed, initial encounter: Secondary | ICD-10-CM | POA: Insufficient documentation

## 2013-04-13 DIAGNOSIS — S0003XA Contusion of scalp, initial encounter: Secondary | ICD-10-CM | POA: Insufficient documentation

## 2013-04-13 DIAGNOSIS — I129 Hypertensive chronic kidney disease with stage 1 through stage 4 chronic kidney disease, or unspecified chronic kidney disease: Secondary | ICD-10-CM | POA: Insufficient documentation

## 2013-04-13 DIAGNOSIS — Z79899 Other long term (current) drug therapy: Secondary | ICD-10-CM | POA: Insufficient documentation

## 2013-04-13 DIAGNOSIS — Z8781 Personal history of (healed) traumatic fracture: Secondary | ICD-10-CM | POA: Insufficient documentation

## 2013-04-13 DIAGNOSIS — F0280 Dementia in other diseases classified elsewhere without behavioral disturbance: Secondary | ICD-10-CM | POA: Insufficient documentation

## 2013-04-13 DIAGNOSIS — Z853 Personal history of malignant neoplasm of breast: Secondary | ICD-10-CM | POA: Insufficient documentation

## 2013-04-13 DIAGNOSIS — S51009A Unspecified open wound of unspecified elbow, initial encounter: Secondary | ICD-10-CM | POA: Insufficient documentation

## 2013-04-13 DIAGNOSIS — E039 Hypothyroidism, unspecified: Secondary | ICD-10-CM | POA: Insufficient documentation

## 2013-04-13 DIAGNOSIS — IMO0002 Reserved for concepts with insufficient information to code with codable children: Secondary | ICD-10-CM

## 2013-04-13 DIAGNOSIS — Y9210 Unspecified residential institution as the place of occurrence of the external cause: Secondary | ICD-10-CM | POA: Insufficient documentation

## 2013-04-13 DIAGNOSIS — G309 Alzheimer's disease, unspecified: Secondary | ICD-10-CM | POA: Insufficient documentation

## 2013-04-13 DIAGNOSIS — M810 Age-related osteoporosis without current pathological fracture: Secondary | ICD-10-CM | POA: Insufficient documentation

## 2013-04-13 DIAGNOSIS — I5033 Acute on chronic diastolic (congestive) heart failure: Secondary | ICD-10-CM | POA: Insufficient documentation

## 2013-04-13 HISTORY — DX: Unspecified dementia, unspecified severity, without behavioral disturbance, psychotic disturbance, mood disturbance, and anxiety: F03.90

## 2013-04-13 NOTE — ED Provider Notes (Signed)
CSN: [id number]     Arrival date & time 04/13/13  2330 History    This chart was scribed for Martha Nielsen, MD, by Yevette Edwards, ED Scribe. This patient was seen in room [facility] and the patient's care was started at .   First MD Initiated Contact with Patient 04/13/13 2333     Chief Complaint  Patient presents with  . Fall  . skin tear    LEVEL FIVE CAVEAT (Dementia)  Patient is a 79 y.o. female presenting with fall. The history is provided by the patient and the nursing home. The history is limited by the condition of the patient. No language interpreter was used.  Fall This is a new problem. The current episode started less than 1 hour ago.   HPI Comments: Martha Pearson is a 79 y.o. female, brought in by EMS on a board, who presents to the Emergency Department complaining of a fall. She is from [place], and she is believed to have fallen out of her bed PTA. Patient does not provide any significant history, noted to have history of dementia, level V caveat applies.  Past Medical History  Diagnosis Date  . Hypertension   . Thyroid disease     hypothyroid  . Cancer     breast  . Hypothyroid   . Alzheimer disease   . Osteoporosis   . Hip fracture   . LVH (left ventricular hypertrophy)   . Acute on chronic diastolic CHF (congestive heart failure) 07/09/2012.  Marland Kitchen CKD (chronic kidney disease) stage 4, GFR 15-29 ml/min 09/23/2011  . Dementia    Past Surgical History  Procedure Laterality Date  . Mastectomy      right   History reviewed. No pertinent family history. History  Substance Use Topics  . Smoking status: Never Smoker   . Smokeless tobacco: Never Used  . Alcohol Use: No   No OB history provided.  Review of Systems  Unable to perform ROS: Dementia   Level V caveat applies Allergies  Ace inhibitors; Penicillins; and Sulfa antibiotics  Home Medications   Current Outpatient Rx  Name  Route  Sig  Dispense  Refill  . acetaminophen (TYLENOL) 325 MG  tablet   Oral   Take 2 tablets (650 mg total) by mouth every 6 (six) hours as needed for fever.         Marland Kitchen amLODipine (NORVASC) 10 MG tablet   Oral   Take 10 mg by mouth daily.           Marland Kitchen aspirin 81 MG EC tablet   Oral   Take 81 mg by mouth daily.           . calcium carbonate (OS-CAL) 600 MG TABS   Oral   Take 600 mg by mouth 3 (three) times daily.           . carvedilol (COREG) 6.25 MG tablet   Oral   Take 1 tablet (6.25 mg total) by mouth 2 (two) times daily with a meal.         . cholecalciferol (VITAMIN D) 1000 UNITS tablet   Oral   Take 1,000 Units by mouth 2 (two) times daily.         Marland Kitchen ENSURE (ENSURE)   Oral   Take 237 mLs by mouth 3 (three) times daily.         . furosemide (LASIX) 20 MG tablet   Oral   Take 1 tablet (20 mg total) by mouth 2 (  two) times daily.   30 tablet   0   . levothyroxine (SYNTHROID, LEVOTHROID) 75 MCG tablet   Oral   Take 75 mcg by mouth daily.         . megestrol (MEGACE) 40 MG/ML suspension   Oral   Take 400 mg by mouth 2 (two) times daily.         . Memantine HCl ER (NAMENDA XR) 28 MG CP24   Oral   Take 28 mg by mouth daily.         . mirtazapine (REMERON) 15 MG tablet   Oral   Take 15 mg by mouth at bedtime.         Marland Kitchen omeprazole (PRILOSEC) 20 MG capsule   Oral   Take 20 mg by mouth daily.         . polyethylene glycol (MIRALAX / GLYCOLAX) packet   Oral   Take 17 g by mouth daily. constipation         . rivastigmine (EXELON) 4.6 mg/24hr   Transdermal   Place 1 patch onto the skin daily.           . sertraline (ZOLOFT) 25 MG tablet   Oral   Take 25 mg by mouth daily.           Marland Kitchen zinc oxide 20 % ointment   Topical   Apply 1 application topically 3 (three) times daily as needed. *One application applied to affected area(s) of buttocks three times daily as needed for redness          There were no vitals taken for this visit. Physical Exam  Nursing note and vitals  reviewed. Constitutional: She is oriented to person, place, and time. She appears well-developed and well-nourished. No distress.  HENT:  Head: Normocephalic.  Mild contusion right for head without laceration or palpable bony deformity  Eyes: EOM are normal. Pupils are equal, round, and reactive to light.  Neck: No tracheal deviation present.  No midline cervical tenderness or deformity  Cardiovascular: Normal rate.   Pulmonary/Chest: Effort normal and breath sounds normal. No respiratory distress. She exhibits no tenderness.  Abdominal: Soft. She exhibits no distension. There is no tenderness.  Musculoskeletal: Normal range of motion.  Otherwise moves all extremities with distal vascular intact.   Neurological: She is alert and oriented to person, place, and time.  Skin: Skin is warm and dry.  Tear to right elbow. Contusion/abrasion to right forehead.   Psychiatric: She has a normal mood and affect. Her behavior is normal.    ED Course     COORDINATION OF CARE:  11:52 PM- Discussed treatment plan with patient, and the patient agreed to the plan.   Procedures (including critical care time)  \ Results for orders placed during the hospital encounter of 09/26/12  URINALYSIS, ROUTINE W REFLEX MICROSCOPIC      Result Value Range   Color, Urine YELLOW  YELLOW   APPearance CLEAR  CLEAR   Specific Gravity, Urine 1.020  1.005 - 1.030   pH 6.5  5.0 - 8.0   Glucose, UA NEGATIVE  NEGATIVE mg/dL   Hgb urine dipstick NEGATIVE  NEGATIVE   Bilirubin Urine NEGATIVE  NEGATIVE   Ketones, ur NEGATIVE  NEGATIVE mg/dL   Protein, ur >409 (*) NEGATIVE mg/dL   Urobilinogen, UA 0.2  0.0 - 1.0 mg/dL   Nitrite NEGATIVE  NEGATIVE   Leukocytes, UA NEGATIVE  NEGATIVE  CBC WITH DIFFERENTIAL      Result Value  Range   WBC 8.0  4.0 - 10.5 K/uL   RBC 3.96  3.87 - 5.11 MIL/uL   Hemoglobin 11.3 (*) 12.0 - 15.0 g/dL   HCT 40.9  81.1 - 91.4 %   MCV 90.9  78.0 - 100.0 fL   MCH 28.5  26.0 - 34.0 pg    MCHC 31.4  30.0 - 36.0 g/dL   RDW 78.2  95.6 - 21.3 %   Platelets 280  150 - 400 K/uL   Neutrophils Relative % 74  43 - 77 %   Neutro Abs 6.0  1.7 - 7.7 K/uL   Lymphocytes Relative 18  12 - 46 %   Lymphs Abs 1.4  0.7 - 4.0 K/uL   Monocytes Relative 6  3 - 12 %   Monocytes Absolute 0.4  0.1 - 1.0 K/uL   Eosinophils Relative 2  0 - 5 %   Eosinophils Absolute 0.1  0.0 - 0.7 K/uL   Basophils Relative 1  0 - 1 %   Basophils Absolute 0.0  0.0 - 0.1 K/uL  BASIC METABOLIC PANEL      Result Value Range   Sodium 143  135 - 145 mEq/L   Potassium 4.5  3.5 - 5.1 mEq/L   Chloride 107  96 - 112 mEq/L   CO2 24  19 - 32 mEq/L   Glucose, Bld 118 (*) 70 - 99 mg/dL   BUN 75 (*) 6 - 23 mg/dL   Creatinine, Ser 0.86 (*) 0.50 - 1.10 mg/dL   Calcium 9.4  8.4 - 57.8 mg/dL   GFR calc non Af Amer 15 (*) >90 mL/min   GFR calc Af Amer 18 (*) >90 mL/min  URINE MICROSCOPIC-ADD ON      Result Value Range   Squamous Epithelial / LPF RARE  RARE   WBC, UA 0-2  <3 WBC/hpf   RBC / HPF 0-2  <3 RBC/hpf   Casts HYALINE CASTS (*) NEGATIVE   Dg Elbow 2 Views Right  04/14/2013   *RADIOLOGY REPORT*  Clinical Data: Post fall  RIGHT ELBOW - 2 VIEW  Comparison: Right elbow radiographs - 01/13/2012 next  Findings:  Examination is degraded secondary to obliquity and patient's inability to tolerate the examination.  Osteopenia.  No definite displaced fracture or elbow joint effusion.  Joint spaces appear preserved.  Regional soft tissues appear normal.  No radiopaque foreign body.  IMPRESSION: Osteopenia without definite displaced fracture or elbow joint effusion.   Original Report Authenticated By: Tacey Ruiz, MD   Ct Head Wo Contrast  04/14/2013   *RADIOLOGY REPORT*  Clinical Data:  Alzheimer's, fall  CT HEAD WITHOUT CONTRAST CT CERVICAL SPINE WITHOUT CONTRAST  Technique:  Multidetector CT imaging of the head and cervical spine was performed following the standard protocol without intravenous contrast.  Multiplanar CT image  reconstructions of the cervical spine were also generated.  Comparison:  09/26/2012  CT HEAD  Findings: Prominence of the sulci, cisterns, and ventricles, in keeping with volume loss. There are subcortical and periventricular white matter hypodensities, a nonspecific finding most often seen with chronic microangiopathic changes.  There is no evidence for acute hemorrhage, overt hydrocephalus, mass lesion, or abnormal extra-axial fluid collection.  No definite CT evidence for acute cortical based (large artery) infarction. Remote left greater than right basal ganglia lacunar infarctions, similar to prior.  Opacified right frontal sinus and anterior ethmoid air cells on the right.  Mastoid air cells are clear. Right sphenoid chamber mucosal thickening.  No  displaced calvarial fracture.  IMPRESSION: Volume loss and white matter changes as above.  Remote left greater than right basal ganglia lacunar infarctions.  No definite CT evidence of acute intracranial abnormality.  Opacified right paranasal sinuses may reflect acute on chronic sinusitis.  Correlate with symptoms.  CT CERVICAL SPINE  Findings: Left apical scarring. Mild subluxation at the craniocervical junction, anteriorly subluxed on the right and posteriorly subluxed on the left, may be accentuated by positioning.  Postoperative changes of posterior fusion at C2-3. Diffuse osteopenia.  Grade 1 anterolisthesis of C7 on T1. Multilevel posterior disc osteophyte complexes resulting mild to moderate central canal narrowing.  Multilevel facet arthropathy. Paravertebral soft tissues within normal limits.  Atherosclerotic vascular calcifications.  IMPRESSION: Mild subluxation at the craniocervical junction, favored to be accentuated by positioning. However, cannot exclude ligamentous injury in this setting.  Postoperative changes at C2-3.  Multilevel degenerative changes as above.   Original Report Authenticated By: Jearld Lesch, M.D.   Ct Cervical Spine Wo  Contrast  04/14/2013   *RADIOLOGY REPORT*  Clinical Data:  Alzheimer's, fall  CT HEAD WITHOUT CONTRAST CT CERVICAL SPINE WITHOUT CONTRAST  Technique:  Multidetector CT imaging of the head and cervical spine was performed following the standard protocol without intravenous contrast.  Multiplanar CT image reconstructions of the cervical spine were also generated.  Comparison:  09/26/2012  CT HEAD  Findings: Prominence of the sulci, cisterns, and ventricles, in keeping with volume loss. There are subcortical and periventricular white matter hypodensities, a nonspecific finding most often seen with chronic microangiopathic changes.  There is no evidence for acute hemorrhage, overt hydrocephalus, mass lesion, or abnormal extra-axial fluid collection.  No definite CT evidence for acute cortical based (large artery) infarction. Remote left greater than right basal ganglia lacunar infarctions, similar to prior.  Opacified right frontal sinus and anterior ethmoid air cells on the right.  Mastoid air cells are clear. Right sphenoid chamber mucosal thickening.  No displaced calvarial fracture.  IMPRESSION: Volume loss and white matter changes as above.  Remote left greater than right basal ganglia lacunar infarctions.  No definite CT evidence of acute intracranial abnormality.  Opacified right paranasal sinuses may reflect acute on chronic sinusitis.  Correlate with symptoms.  CT CERVICAL SPINE  Findings: Left apical scarring. Mild subluxation at the craniocervical junction, anteriorly subluxed on the right and posteriorly subluxed on the left, may be accentuated by positioning.  Postoperative changes of posterior fusion at C2-3. Diffuse osteopenia.  Grade 1 anterolisthesis of C7 on T1. Multilevel posterior disc osteophyte complexes resulting mild to moderate central canal narrowing.  Multilevel facet arthropathy. Paravertebral soft tissues within normal limits.  Atherosclerotic vascular calcifications.  IMPRESSION: Mild  subluxation at the craniocervical junction, favored to be accentuated by positioning. However, cannot exclude ligamentous injury in this setting.  Postoperative changes at C2-3.  Multilevel degenerative changes as above.   Original Report Authenticated By: Jearld Lesch, M.D.   2:59 AM caregiver bedside, and she states that she knows patient very well, DO NOT RESUSCITATE paperwork presented. Patient is baseline at this time.  Plan discharge back to her care facility, follow up with primary care physician. Continue medications as prescribed.    MDM  Nursing home patient with dementia rolled out bed, presents with contusion and skin tear  Evaluated with imaging and reviewed as above.  Vital signs nurse's notes reviewed and considered.   Wound care provided.  I personally performed the services described in this documentation, which was scribed in my presence.  The recorded information has been reviewed and is accurate.     Martha Nielsen, MD 04/14/13 [phone]

## 2013-04-13 NOTE — ED Notes (Addendum)
Pt  From [facility]. They believe pt fell out of bed. Pt has a skin tear to her right elbow/forearm. Pt also has bruising on right side of forehead. Pt on lsb.

## 2013-04-14 ENCOUNTER — Emergency Department (HOSPITAL_COMMUNITY): Payer: Medicare Other

## 2013-04-14 MED ORDER — SODIUM CHLORIDE 0.9 % IV SOLN: INTRAVENOUS | Status: DC

## 2013-04-14 MED ORDER — FENTANYL CITRATE 0.05 MG/ML IJ SOLN
50.0000 ug | Freq: Once | INTRAMUSCULAR | Status: DC
  Filled 2013-04-14: qty 2

## 2013-04-14 NOTE — ED Notes (Signed)
Family member with pt does not want pt to have an IV or IV pain meds. Family member states pt always screams like she is in pain when you touch her and prefers the pt to be knocked out on pain medication, stating pt is very sensitive to pain meds. Will notify EDP.

## 2013-04-14 NOTE — ED Notes (Signed)
Dressed skin tear w/ non stick dressing.

## 2013-04-14 NOTE — ED Notes (Signed)
EMS called to return pt back to nursing home.

## 2013-04-14 NOTE — ED Notes (Signed)
Family member states spt does not walk. Only sits in wheelchair or in bed. EDP notified.

## 2013-06-14 ENCOUNTER — Emergency Department (HOSPITAL_COMMUNITY)
Admission: EM | Admit: 2013-06-14 | Discharge: 2013-06-14 | Disposition: A | Discharge status: Home / Self Care | Payer: Medicare Other | Attending: Emergency Medicine | Admitting: Emergency Medicine

## 2013-06-14 ENCOUNTER — Encounter (HOSPITAL_COMMUNITY): Payer: Self-pay | Admitting: Emergency Medicine

## 2013-06-14 DIAGNOSIS — I5033 Acute on chronic diastolic (congestive) heart failure: Secondary | ICD-10-CM | POA: Insufficient documentation

## 2013-06-14 DIAGNOSIS — N184 Chronic kidney disease, stage 4 (severe): Secondary | ICD-10-CM | POA: Insufficient documentation

## 2013-06-14 DIAGNOSIS — W19XXXA Unspecified fall, initial encounter: Secondary | ICD-10-CM

## 2013-06-14 DIAGNOSIS — Y9389 Activity, other specified: Secondary | ICD-10-CM | POA: Insufficient documentation

## 2013-06-14 DIAGNOSIS — I129 Hypertensive chronic kidney disease with stage 1 through stage 4 chronic kidney disease, or unspecified chronic kidney disease: Secondary | ICD-10-CM | POA: Insufficient documentation

## 2013-06-14 DIAGNOSIS — Z8781 Personal history of (healed) traumatic fracture: Secondary | ICD-10-CM | POA: Insufficient documentation

## 2013-06-14 DIAGNOSIS — Z79899 Other long term (current) drug therapy: Secondary | ICD-10-CM | POA: Insufficient documentation

## 2013-06-14 DIAGNOSIS — Y9210 Unspecified residential institution as the place of occurrence of the external cause: Secondary | ICD-10-CM | POA: Insufficient documentation

## 2013-06-14 DIAGNOSIS — S3981XA Other specified injuries of abdomen, initial encounter: Secondary | ICD-10-CM | POA: Insufficient documentation

## 2013-06-14 DIAGNOSIS — W06XXXA Fall from bed, initial encounter: Secondary | ICD-10-CM | POA: Insufficient documentation

## 2013-06-14 DIAGNOSIS — Z853 Personal history of malignant neoplasm of breast: Secondary | ICD-10-CM | POA: Insufficient documentation

## 2013-06-14 DIAGNOSIS — E039 Hypothyroidism, unspecified: Secondary | ICD-10-CM | POA: Insufficient documentation

## 2013-06-14 DIAGNOSIS — M810 Age-related osteoporosis without current pathological fracture: Secondary | ICD-10-CM | POA: Insufficient documentation

## 2013-06-14 DIAGNOSIS — G309 Alzheimer's disease, unspecified: Secondary | ICD-10-CM | POA: Insufficient documentation

## 2013-06-14 DIAGNOSIS — F0280 Dementia in other diseases classified elsewhere without behavioral disturbance: Secondary | ICD-10-CM | POA: Insufficient documentation

## 2013-06-14 DIAGNOSIS — Z880 Allergy status to penicillin: Secondary | ICD-10-CM | POA: Insufficient documentation

## 2013-06-14 NOTE — ED Notes (Signed)
Pt had unwitnessed fall and c/o rt sided abd pain/rib pain.

## 2013-06-14 NOTE — ED Provider Notes (Signed)
CSN: [id number]     Arrival date & time 06/14/13  0012 History   First MD Initiated Contact with Patient 06/14/13 0047     Chief Complaint  Patient presents with  . Fall  . Abdominal Pain    Patient is a 79 y.o. female presenting with fall. The history is provided by the nursing home and a relative. The history is limited by the condition of the patient.  Fall This is a new problem. The current episode started 1 to 2 hours ago. The problem has not changed since onset.Nothing aggravates the symptoms. The symptoms are relieved by medications.  pt presents from nursing home s/p fall She is accompanied by her cousin who is her guardian Pt reportedly slid out of bed and started to complain of abdominal pain  no other injuries are reported Pt has h/o dementia and is nonambulatory at baseline  Pt is not on anticoagulants per cousin  Past Medical History  Diagnosis Date  . Hypertension   . Thyroid disease     hypothyroid  . Cancer     breast  . Hypothyroid   . Alzheimer disease   . Osteoporosis   . Hip fracture   . LVH (left ventricular hypertrophy)   . Acute on chronic diastolic CHF (congestive heart failure) 07/09/2012.  Marland Kitchen CKD (chronic kidney disease) stage 4, GFR 15-29 ml/min 09/23/2011  . Dementia    Past Surgical History  Procedure Laterality Date  . Mastectomy      right   No family history on file. History  Substance Use Topics  . Smoking status: Never Smoker   . Smokeless tobacco: Never Used  . Alcohol Use: No   OB History   Grav Para Term Preterm Abortions TAB SAB Ect Mult Living                 Review of Systems  Unable to perform ROS: Dementia    Allergies  Ace inhibitors; Penicillins; and Sulfa antibiotics  Home Medications   Current Outpatient Rx  Name  Route  Sig  Dispense  Refill  . acetaminophen (TYLENOL) 325 MG tablet   Oral   Take 2 tablets (650 mg total) by mouth every 6 (six) hours as needed for fever.         Marland Kitchen amLODipine (NORVASC) 10  MG tablet   Oral   Take 10 mg by mouth daily.           . carvedilol (COREG) 6.25 MG tablet   Oral   Take 1 tablet (6.25 mg total) by mouth 2 (two) times daily with a meal.         . cholecalciferol (VITAMIN D) 1000 UNITS tablet   Oral   Take 1,000 Units by mouth 2 (two) times daily.         Marland Kitchen ENSURE (ENSURE)   Oral   Take 237 mLs by mouth 3 (three) times daily.         . furosemide (LASIX) 20 MG tablet   Oral   Take 20 mg by mouth as needed.         Marland Kitchen guaiFENesin-dextromethorphan (ROBITUSSIN DM) 100-10 MG/5ML syrup   Oral   Take 5 mLs by mouth 3 (three) times daily as needed for cough.         . levothyroxine (SYNTHROID, LEVOTHROID) 75 MCG tablet   Oral   Take 75 mcg by mouth daily.         Marland Kitchen loperamide (IMODIUM) 2 MG  capsule   Oral   Take 2 mg by mouth 4 (four) times daily as needed for diarrhea or loose stools.         Marland Kitchen LORazepam (ATIVAN) 0.5 MG tablet   Oral   Take 0.5 mg by mouth 4 (four) times daily as needed for anxiety.         . miconazole (MICOTIN) 2 % powder   Topical   Apply topically as needed for itching.         Marland Kitchen omeprazole (PRILOSEC) 20 MG capsule   Oral   Take 20 mg by mouth daily.         . polyethylene glycol (MIRALAX / GLYCOLAX) packet   Oral   Take 17 g by mouth daily. constipation         . promethazine (PHENERGAN) 25 MG tablet   Oral   Take 25 mg by mouth every 6 (six) hours as needed for nausea.         . sertraline (ZOLOFT) 25 MG tablet   Oral   Take 25 mg by mouth daily.           Marland Kitchen zinc oxide 20 % ointment   Topical   Apply 1 application topically 3 (three) times daily as needed. *One application applied to affected area(s) of buttocks three times daily as needed for redness          BP 162/68  Pulse 76  Temp(Src) 98.7 F (37.1 C) (Oral)  Resp 20  Wt 113 lb 7 oz (51.455 kg)  BMI 18.88 kg/m2  SpO2 96% Physical Exam CONSTITUTIONAL: elderly, frail, sleeping on arrival HEAD:  Normocephalic/atraumatic EYES: EOMI/PERRL ENMT: Mucous membranes moist NECK: supple no meningeal signs SPINE:entire spine nontender, No bruising/crepitance/stepoffs noted to spine CV: murmur noted Chest - no tenderness/crepitance noted.  No bruising noted LUNGS: Lungs are clear to auscultation bilaterally, no apparent distress ABDOMEN: soft, nontender. No bruising is noted to abdomen GU:no cva tenderness NEURO: Pt is sleeping on arrival.  She is easily arousable and in no distress.  She is pleasantly demented.  She moves all extremities and follows commands EXTREMITIES: pulses normal, full ROM. I ranged all 4 extremities and no tenderness noted, no deformity noted.  She has healing bruises to right elbow.  No signs of acute injury.  Pelvis stable.  Full ROM of both hips/knees without any focal tenderness.  No tenderness noted with ROM of either shoulder/elbow/wrist.   SKIN: warm, color normal   ED Course  Procedures (including critical care time) Labs Review Labs Reviewed - No data to display Imaging Review No results found.  EKG Interpretation   None       MDM   1. Fall, accidental, initial encounter    Nursing notes including past medical history and social history reviewed and considered in documentation  No signs of acute injury Pt is at baseline per family Stable for d/c back to nursing facility No signs of abdominal injury   Joya Gaskins, MD 06/14/13 0149

## 2013-06-14 NOTE — ED Notes (Signed)
Notified [employer] [employer] of pt's condition informed them pt would be discharged and need a ride home.

## 2013-06-20 ENCOUNTER — Emergency Department (HOSPITAL_COMMUNITY)
Admission: EM | Admit: 2013-06-20 | Discharge: 2013-06-20 | Disposition: A | Discharge status: Home / Self Care | Payer: Medicare Other | Attending: Emergency Medicine | Admitting: Emergency Medicine

## 2013-06-20 ENCOUNTER — Emergency Department (HOSPITAL_COMMUNITY): Payer: Medicare Other

## 2013-06-20 ENCOUNTER — Encounter (HOSPITAL_COMMUNITY): Payer: Self-pay | Admitting: Emergency Medicine

## 2013-06-20 DIAGNOSIS — Z880 Allergy status to penicillin: Secondary | ICD-10-CM | POA: Insufficient documentation

## 2013-06-20 DIAGNOSIS — W19XXXA Unspecified fall, initial encounter: Secondary | ICD-10-CM

## 2013-06-20 DIAGNOSIS — F0390 Unspecified dementia without behavioral disturbance: Secondary | ICD-10-CM

## 2013-06-20 DIAGNOSIS — Z8781 Personal history of (healed) traumatic fracture: Secondary | ICD-10-CM | POA: Insufficient documentation

## 2013-06-20 DIAGNOSIS — G309 Alzheimer's disease, unspecified: Secondary | ICD-10-CM | POA: Insufficient documentation

## 2013-06-20 DIAGNOSIS — N184 Chronic kidney disease, stage 4 (severe): Secondary | ICD-10-CM | POA: Insufficient documentation

## 2013-06-20 DIAGNOSIS — Y9210 Unspecified residential institution as the place of occurrence of the external cause: Secondary | ICD-10-CM | POA: Insufficient documentation

## 2013-06-20 DIAGNOSIS — W06XXXA Fall from bed, initial encounter: Secondary | ICD-10-CM | POA: Insufficient documentation

## 2013-06-20 DIAGNOSIS — Z853 Personal history of malignant neoplasm of breast: Secondary | ICD-10-CM | POA: Insufficient documentation

## 2013-06-20 DIAGNOSIS — Y9389 Activity, other specified: Secondary | ICD-10-CM | POA: Insufficient documentation

## 2013-06-20 DIAGNOSIS — Z8739 Personal history of other diseases of the musculoskeletal system and connective tissue: Secondary | ICD-10-CM | POA: Insufficient documentation

## 2013-06-20 DIAGNOSIS — Z79899 Other long term (current) drug therapy: Secondary | ICD-10-CM | POA: Insufficient documentation

## 2013-06-20 DIAGNOSIS — I129 Hypertensive chronic kidney disease with stage 1 through stage 4 chronic kidney disease, or unspecified chronic kidney disease: Secondary | ICD-10-CM | POA: Insufficient documentation

## 2013-06-20 DIAGNOSIS — I5043 Acute on chronic combined systolic (congestive) and diastolic (congestive) heart failure: Secondary | ICD-10-CM | POA: Insufficient documentation

## 2013-06-20 DIAGNOSIS — F0280 Dementia in other diseases classified elsewhere without behavioral disturbance: Secondary | ICD-10-CM | POA: Insufficient documentation

## 2013-06-20 DIAGNOSIS — E039 Hypothyroidism, unspecified: Secondary | ICD-10-CM | POA: Insufficient documentation

## 2013-06-20 DIAGNOSIS — Z043 Encounter for examination and observation following other accident: Secondary | ICD-10-CM | POA: Insufficient documentation

## 2013-06-20 NOTE — ED Provider Notes (Signed)
CSN: [id number]     Arrival date & time 06/20/13  0115 History   First MD Initiated Contact with Patient 06/20/13 0118     Chief Complaint  Patient presents with  . Fall   (Consider location/radiation/quality/duration/timing/severity/associated sxs/prior Treatment) Patient is a 79 y.o. female presenting with fall. The history is provided by the nursing home. The history is limited by the condition of the patient (Dementia).  Fall  She was found on the floor and is presumed to have rolled out of bed. Does not have any complaints but has no memory of the fall. Family member is here and states that she is at her baseline mental status.  Past Medical History  Diagnosis Date  . Hypertension   . Thyroid disease     hypothyroid  . Cancer     breast  . Hypothyroid   . Alzheimer disease   . Osteoporosis   . Hip fracture   . LVH (left ventricular hypertrophy)   . Acute on chronic diastolic CHF (congestive heart failure) 07/09/2012.  Marland Kitchen CKD (chronic kidney disease) stage 4, GFR 15-29 ml/min 09/23/2011  . Dementia    Past Surgical History  Procedure Laterality Date  . Mastectomy      right   History reviewed. No pertinent family history. History  Substance Use Topics  . Smoking status: Never Smoker   . Smokeless tobacco: Never Used  . Alcohol Use: No   OB History   Grav Para Term Preterm Abortions TAB SAB Ect Mult Living                 Review of Systems  Unable to perform ROS: Dementia    Allergies  Ace inhibitors; Penicillins; and Sulfa antibiotics  Home Medications   Current Outpatient Rx  Name  Route  Sig  Dispense  Refill  . acetaminophen (TYLENOL) 325 MG tablet   Oral   Take 2 tablets (650 mg total) by mouth every 6 (six) hours as needed for fever.         Marland Kitchen amLODipine (NORVASC) 10 MG tablet   Oral   Take 10 mg by mouth daily.           . carvedilol (COREG) 6.25 MG tablet   Oral   Take 1 tablet (6.25 mg total) by mouth 2 (two) times daily with a meal.          . cholecalciferol (VITAMIN D) 1000 UNITS tablet   Oral   Take 1,000 Units by mouth 2 (two) times daily.         Marland Kitchen ENSURE (ENSURE)   Oral   Take 237 mLs by mouth 3 (three) times daily.         . furosemide (LASIX) 20 MG tablet   Oral   Take 20 mg by mouth as needed.         Marland Kitchen guaiFENesin-dextromethorphan (ROBITUSSIN DM) 100-10 MG/5ML syrup   Oral   Take 5 mLs by mouth 3 (three) times daily as needed for cough.         . levothyroxine (SYNTHROID, LEVOTHROID) 75 MCG tablet   Oral   Take 75 mcg by mouth daily.         Marland Kitchen loperamide (IMODIUM) 2 MG capsule   Oral   Take 2 mg by mouth 4 (four) times daily as needed for diarrhea or loose stools.         Marland Kitchen LORazepam (ATIVAN) 0.5 MG tablet   Oral   Take 0.5 mg  by mouth 4 (four) times daily as needed for anxiety.         . miconazole (MICOTIN) 2 % powder   Topical   Apply topically as needed for itching.         Marland Kitchen omeprazole (PRILOSEC) 20 MG capsule   Oral   Take 20 mg by mouth daily.         . polyethylene glycol (MIRALAX / GLYCOLAX) packet   Oral   Take 17 g by mouth daily. constipation         . promethazine (PHENERGAN) 25 MG tablet   Oral   Take 25 mg by mouth every 6 (six) hours as needed for nausea.         . sertraline (ZOLOFT) 25 MG tablet   Oral   Take 25 mg by mouth daily.           Marland Kitchen zinc oxide 20 % ointment   Topical   Apply 1 application topically 3 (three) times daily as needed. *One application applied to affected area(s) of buttocks three times daily as needed for redness          BP 180/73  Pulse 85  Temp(Src) 97.7 F (36.5 C) (Oral)  Resp 18  Wt 113 lb (51.256 kg)  BMI 18.8 kg/m2  SpO2 99% Physical Exam  Nursing note and vitals reviewed.  79 year old female, resting comfortably and in no acute distress. Vital signs are significant for hypertension with blood pressure 180/73. Oxygen saturation is 99%, which is normal. Head is normocephalic and atraumatic. PERRLA,  EOMI. Oropharynx is clear. Neck is nontenderwithout adenopathy or JVD. Back is nontender and there is no CVA tenderness. Lungs are clear without rales, wheezes, or rhonchi. Chest is nontender. Heart has regular rate and rhythm without murmur. Abdomen is soft, flat, nontender without masses or hepatosplenomegaly and peristalsis is normoactive. Extremities have no cyanosis or edema, full range of motion is present. Skin is warm and dry without rash. Neurologic: She is awake and alert, speech is normal, she is oriented to person but not place or time, cranial nerves are intact, there are no motor or sensory deficits. Moderate to severe cogwheel rigidity is present.  ED Course  Procedures (including critical care time) Imaging Review Ct Head Wo Contrast  06/20/2013   CLINICAL DATA:  Fall  EXAM: CT HEAD WITHOUT CONTRAST  CT CERVICAL SPINE WITHOUT CONTRAST  TECHNIQUE: Multidetector CT imaging of the head and cervical spine was performed following the standard protocol without intravenous contrast. Multiplanar CT image reconstructions of the cervical spine were also generated.  COMPARISON:  Prior study from 04/14/2013  FINDINGS: CT HEAD FINDINGS  Prominent age-related atrophy and chronic microvascular ischemic disease is similar as compared to the prior exam. Remote lacunar infarctions involving predominantly in the left basal ganglia are also stable. Extensive atherosclerotic calcifications seen within the visualized intracranial circulation. No acute intracranial hemorrhage or large vessel territory infarct. There is no extra-axial fluid collection. The calvarium is intact. Orbital soft tissues are normal.  There is complete opacification of the visualized right maxillary sinus as well as the right frontoethmoidal sinuses. Hyperdensity is seen centrally within this opacity. Findings likely represent chronic sinusitis with superimposed desiccated secretions and/or fungal elements. No definite maxillofacial  fracture identified. Mastoid air cells are clear.  CT CERVICAL SPINE FINDINGS  Postoperative changes from prior posterior spinal fixation are again seen at the C1-2 level. The hardware is stable in position and remains intact. No periprosthetic lucency to  suggest loosening or infection are seen.  Trace anterolisthesis of C7 on T1 is stable. There is trace retrolisthesis of C5 on C6. No acute fracture or listhesis identified. No prevertebral soft tissue swelling. Facet joints are aligned. Diffuse osteopenia is noted. Degenerative disc disease is seen throughout the cervical spine, most severe at C5-6.  Visualized lung apices are clear.  IMPRESSION: CT BRAIN:  1. No acute intracranial process 2. Stable appearance of the prominent age-related atrophy and chronic microvascular ischemic disease. 3. The scattered remote lacunar infarcts within the bilateral basal ganglia, left greater than right. 4. The chronic right-sided sinus disease. No definite maxillofacial fracture identified, although correlation with physical exam is recommended.  CT CERVICAL SPINE:  1. No acute fracture or listhesis. 2. Stable spinal fixation hardware at the C1-2 level without evidence of complication or failure. 3. Multilevel degenerative disc disease, most severe at C5-6.   Electronically Signed   By: Rise Mu M.D.   On: 06/20/2013 02:38   Ct Cervical Spine Wo Contrast  06/20/2013   CLINICAL DATA:  Fall  EXAM: CT HEAD WITHOUT CONTRAST  CT CERVICAL SPINE WITHOUT CONTRAST  TECHNIQUE: Multidetector CT imaging of the head and cervical spine was performed following the standard protocol without intravenous contrast. Multiplanar CT image reconstructions of the cervical spine were also generated.  COMPARISON:  Prior study from 04/14/2013  FINDINGS: CT HEAD FINDINGS  Prominent age-related atrophy and chronic microvascular ischemic disease is similar as compared to the prior exam. Remote lacunar infarctions involving predominantly in  the left basal ganglia are also stable. Extensive atherosclerotic calcifications seen within the visualized intracranial circulation. No acute intracranial hemorrhage or large vessel territory infarct. There is no extra-axial fluid collection. The calvarium is intact. Orbital soft tissues are normal.  There is complete opacification of the visualized right maxillary sinus as well as the right frontoethmoidal sinuses. Hyperdensity is seen centrally within this opacity. Findings likely represent chronic sinusitis with superimposed desiccated secretions and/or fungal elements. No definite maxillofacial fracture identified. Mastoid air cells are clear.  CT CERVICAL SPINE FINDINGS  Postoperative changes from prior posterior spinal fixation are again seen at the C1-2 level. The hardware is stable in position and remains intact. No periprosthetic lucency to suggest loosening or infection are seen.  Trace anterolisthesis of C7 on T1 is stable. There is trace retrolisthesis of C5 on C6. No acute fracture or listhesis identified. No prevertebral soft tissue swelling. Facet joints are aligned. Diffuse osteopenia is noted. Degenerative disc disease is seen throughout the cervical spine, most severe at C5-6.  Visualized lung apices are clear.  IMPRESSION: CT BRAIN:  1. No acute intracranial process 2. Stable appearance of the prominent age-related atrophy and chronic microvascular ischemic disease. 3. The scattered remote lacunar infarcts within the bilateral basal ganglia, left greater than right. 4. The chronic right-sided sinus disease. No definite maxillofacial fracture identified, although correlation with physical exam is recommended.  CT CERVICAL SPINE:  1. No acute fracture or listhesis. 2. Stable spinal fixation hardware at the C1-2 level without evidence of complication or failure. 3. Multilevel degenerative disc disease, most severe at C5-6.   Electronically Signed   By: Rise Mu M.D.   On: 06/20/2013  02:38    MDM   1. Fall at nursing home, initial encounter   2. Dementia    Fall without apparent injury. She'll be sent for CT of head and cervical spine to rule out occult injury. Old records are reviewed and she has prior ED visits  for falls.  CTs are negative for acute injury. She is discharged to return to her nursing home.  Dione Booze, MD 06/20/13 [phone]

## 2013-06-20 NOTE — ED Notes (Signed)
Pt arrived to department via EMS from [facility].  Per report, pt was found on floor.  Unknown time of fall or injury.  Pt has history of dementia.

## 2013-07-26 ENCOUNTER — Emergency Department (HOSPITAL_COMMUNITY)
Admission: EM | Admit: 2013-07-26 | Discharge: 2013-07-26 | Disposition: A | Discharge status: Home / Self Care | Payer: Medicare Other | Attending: Emergency Medicine | Admitting: Emergency Medicine

## 2013-07-26 ENCOUNTER — Emergency Department (HOSPITAL_COMMUNITY): Payer: Medicare Other

## 2013-07-26 ENCOUNTER — Encounter (HOSPITAL_COMMUNITY): Payer: Self-pay | Admitting: Emergency Medicine

## 2013-07-26 DIAGNOSIS — F0280 Dementia in other diseases classified elsewhere without behavioral disturbance: Secondary | ICD-10-CM | POA: Insufficient documentation

## 2013-07-26 DIAGNOSIS — I129 Hypertensive chronic kidney disease with stage 1 through stage 4 chronic kidney disease, or unspecified chronic kidney disease: Secondary | ICD-10-CM | POA: Insufficient documentation

## 2013-07-26 DIAGNOSIS — E039 Hypothyroidism, unspecified: Secondary | ICD-10-CM | POA: Insufficient documentation

## 2013-07-26 DIAGNOSIS — Y939 Activity, unspecified: Secondary | ICD-10-CM | POA: Insufficient documentation

## 2013-07-26 DIAGNOSIS — S0181XA Laceration without foreign body of other part of head, initial encounter: Secondary | ICD-10-CM

## 2013-07-26 DIAGNOSIS — W19XXXA Unspecified fall, initial encounter: Secondary | ICD-10-CM

## 2013-07-26 DIAGNOSIS — R296 Repeated falls: Secondary | ICD-10-CM | POA: Insufficient documentation

## 2013-07-26 DIAGNOSIS — Z880 Allergy status to penicillin: Secondary | ICD-10-CM | POA: Insufficient documentation

## 2013-07-26 DIAGNOSIS — N184 Chronic kidney disease, stage 4 (severe): Secondary | ICD-10-CM | POA: Insufficient documentation

## 2013-07-26 DIAGNOSIS — Z79899 Other long term (current) drug therapy: Secondary | ICD-10-CM | POA: Insufficient documentation

## 2013-07-26 DIAGNOSIS — G309 Alzheimer's disease, unspecified: Secondary | ICD-10-CM | POA: Insufficient documentation

## 2013-07-26 DIAGNOSIS — Z8739 Personal history of other diseases of the musculoskeletal system and connective tissue: Secondary | ICD-10-CM | POA: Insufficient documentation

## 2013-07-26 DIAGNOSIS — I5033 Acute on chronic diastolic (congestive) heart failure: Secondary | ICD-10-CM | POA: Insufficient documentation

## 2013-07-26 DIAGNOSIS — S0180XA Unspecified open wound of other part of head, initial encounter: Secondary | ICD-10-CM | POA: Insufficient documentation

## 2013-07-26 DIAGNOSIS — Y9210 Unspecified residential institution as the place of occurrence of the external cause: Secondary | ICD-10-CM | POA: Insufficient documentation

## 2013-07-26 DIAGNOSIS — Z853 Personal history of malignant neoplasm of breast: Secondary | ICD-10-CM | POA: Insufficient documentation

## 2013-07-26 DIAGNOSIS — Z8781 Personal history of (healed) traumatic fracture: Secondary | ICD-10-CM | POA: Insufficient documentation

## 2013-07-26 MED ORDER — LIDOCAINE-EPINEPHRINE-TETRACAINE (LET) SOLUTION
3.0000 mL | Freq: Once | NASAL | Status: AC
  Administered 2013-07-26: 3 mL via TOPICAL
  Filled 2013-07-26: qty 3

## 2013-07-26 NOTE — ED Provider Notes (Signed)
CSN: [id number]     Arrival date & time 07/26/13  1539 History   First MD Initiated Contact with Patient 07/26/13 1609     Chief Complaint  Patient presents with  . Fall   Level V caveat for dementia  (Consider location/radiation/quality/duration/timing/severity/associated sxs/prior Treatment) HPI Patient presents to the emergency department via EMS. Her guardian he is in the room. She states she was told the patient was found lying on the floor by her bed. It was presumed she either rolled out of bed or fell after getting up out of bed. She was noted to have a laceration on her head. She states some days the patient is not seem to know her, patient does not normally have conversations. When asked if she is having pain patient just stares at me.   PCP Dr Margo Aye  Past Medical History  Diagnosis Date  . Hypertension   . Thyroid disease     hypothyroid  . Cancer     breast  . Hypothyroid   . Alzheimer disease   . Osteoporosis   . Hip fracture   . LVH (left ventricular hypertrophy)   . Acute on chronic diastolic CHF (congestive heart failure) 07/09/2012.  Marland Kitchen CKD (chronic kidney disease) stage 4, GFR 15-29 ml/min 09/23/2011  . Dementia    Past Surgical History  Procedure Laterality Date  . Mastectomy      right   No family history on file. History  Substance Use Topics  . Smoking status: Never Smoker   . Smokeless tobacco: Never Used  . Alcohol Use: No   Lives in NH   OB History   Grav Para Term Preterm Abortions TAB SAB Ect Mult Living                 Review of Systems  All other systems reviewed and are negative.    Allergies  Ace inhibitors; Penicillins; and Sulfa antibiotics  Home Medications   Current Outpatient Rx  Name  Route  Sig  Dispense  Refill  . carvedilol (COREG) 6.25 MG tablet   Oral   Take 1 tablet (6.25 mg total) by mouth 2 (two) times daily with a meal.         . cholecalciferol (VITAMIN D) 1000 UNITS tablet   Oral   Take 1,000 Units by  mouth 2 (two) times daily.         Marland Kitchen ENSURE (ENSURE)   Oral   Take 237 mLs by mouth 2 (two) times daily.          Marland Kitchen levothyroxine (SYNTHROID, LEVOTHROID) 75 MCG tablet   Oral   Take 75 mcg by mouth daily.         Marland Kitchen LORazepam (ATIVAN) 0.5 MG tablet   Oral   Take 0.25 mg by mouth every 4 (four) hours as needed for anxiety.          Marland Kitchen omeprazole (PRILOSEC) 20 MG capsule   Oral   Take 20 mg by mouth daily.         . polyethylene glycol (MIRALAX / GLYCOLAX) packet   Oral   Take 17 g by mouth daily. constipation         . sertraline (ZOLOFT) 25 MG tablet   Oral   Take 25 mg by mouth daily.           Marland Kitchen acetaminophen (TYLENOL) 325 MG tablet   Oral   Take 2 tablets (650 mg total) by mouth every 6 (six) hours  as needed for fever.         Marland Kitchen amLODipine (NORVASC) 10 MG tablet   Oral   Take 10 mg by mouth daily.           Marland Kitchen guaiFENesin-dextromethorphan (ROBITUSSIN DM) 100-10 MG/5ML syrup   Oral   Take 5 mLs by mouth 3 (three) times daily as needed for cough.         . loperamide (IMODIUM) 2 MG capsule   Oral   Take 2 mg by mouth 4 (four) times daily as needed for diarrhea or loose stools.         . promethazine (PHENERGAN) 25 MG tablet   Oral   Take 25 mg by mouth every 6 (six) hours as needed for nausea.         Marland Kitchen zinc oxide 20 % ointment   Topical   Apply 1 application topically 3 (three) times daily as needed. *One application applied to affected area(s) of buttocks three times daily as needed for redness          BP 191/93  Pulse 86  Temp(Src) 97.9 F (36.6 C) (Oral)  Resp 20  SpO2 100%  Vital signs normal except hypertension  Physical Exam  Nursing note and vitals reviewed. Constitutional: She appears well-developed and well-nourished.  Non-toxic appearance. She does not appear ill. No distress.  Elderly frail female who does not answer questions although she is awake and making eye contact.  HENT:  Head: Normocephalic.    Right  Ear: External ear normal.  Left Ear: External ear normal.  Nose: Nose normal. No mucosal edema or rhinorrhea.  Mouth/Throat: Oropharynx is clear and moist and mucous membranes are normal. No dental abscesses or uvula swelling.  Patient has a 1 cm laceration in her left temple/forehead with bruising of her forehead near the hairline on the left.  Eyes: Conjunctivae and EOM are normal. Pupils are equal, round, and reactive to light.  Neck: Normal range of motion and full passive range of motion without pain. Neck supple.  Cardiovascular: Normal rate, regular rhythm and normal heart sounds.  Exam reveals no gallop and no friction rub.   No murmur heard. Pulmonary/Chest: Effort normal and breath sounds normal. No respiratory distress. She has no wheezes. She has no rhonchi. She has no rales. She exhibits no tenderness and no crepitus.  Abdominal: Soft. Normal appearance and bowel sounds are normal. She exhibits no distension. There is no tenderness. There is no rebound and no guarding.  Musculoskeletal: Normal range of motion. She exhibits no edema and no tenderness.  Patient initially resisted movement of her left upper extremity however she then let me move it. Her guardian states she has injured that arm before from a prior fall. She moves her right leg well with flexion and extension at the hip and knee without apparent discomfort. She appeared to have some minor discomfort on the left that got better as I was able to flex and extend her lower leg.  Neurological: She is alert. She has normal strength. No cranial nerve deficit.  Skin: Skin is warm, dry and intact. No rash noted. No erythema. No pallor.  Psychiatric: She has a normal mood and affect. Her speech is normal and behavior is normal. Her mood appears not anxious.    ED Course  Procedures (including critical care time)   LACERATION REPAIR Performed by: Mischelle Reeg L Authorized by: Ward Givens Consent: Verbal consent obtained. Risks  and benefits: risks, benefits and alternatives were discussed  Consent given by: patient Patient identity confirmed: provided demographic data Prepped and Draped in normal sterile fashion Wound explored  Laceration Location: left forehead  Laceration Length:  1 cm  No Foreign Bodies seen or palpated  Local anesthetic:LET  Anesthetic total: 3 ml  Amount of cleaning: standard  Skin closure: dermabond   Patient tolerance: Patient tolerated the procedure well with no immediate complications.    Labs Review Labs Reviewed - No data to display Imaging Review Dg Pelvis 1-2 Views  07/26/2013   CLINICAL DATA:  Fall  EXAM: PELVIS - 1-2 VIEW  COMPARISON:  05/16/2012  FINDINGS: Study is limited by diffuse osteopenia. No acute fracture or subluxation. Again noted metallic fixation material bilateral proximal femur. Stable old fractures of the left pubic rami.  IMPRESSION: No acute fracture or subluxation. Again noted metallic fixation material bilateral proximal femur. Stable old fractures of the left pubic rami.   Electronically Signed   By: Natasha Mead M.D.   On: 07/26/2013 17:12   Ct Head Wo Contrast Ct Cervical Spine Wo Contrast  07/26/2013   CLINICAL DATA:  Fall  EXAM: CT HEAD WITHOUT CONTRAST  CT CERVICAL SPINE WITHOUT CONTRAST  TECHNIQUE: Multidetector CT imaging of the head and cervical spine was performed following the standard protocol without intravenous contrast. Multiplanar CT image reconstructions of the cervical spine were also generated.  COMPARISON:  06/20/2013  FINDINGS: CT HEAD FINDINGS  Again noted complete opacification of the right maxillary sinus right ethmoid air cells and right frontal sinus. There is soft tissue swelling and scalp stranding in left frontal region.  No skull fracture is noted. Paranasal sinuses are unremarkable. No intracranial hemorrhage, mass effect or midline shift.  Stable atrophy and chronic white matter disease. No acute cortical infarction. No mass  lesion is noted on this unenhanced scan. Stable periventricular and subcortical chronic white matter disease. Lacunar infarcts in left basal ganglia again noted.  CT CERVICAL SPINE FINDINGS  Axial images of the cervical spine shows no acute fracture or subluxation. Stable postsurgical changes C1 and C2 vertebral body with posterior fixation. Again noted mild disc space flattening with mild posterior spurring at C3-C4-C4-C5 C5-C6 and C6-C7 level. No prevertebral soft tissue swelling. There is diffuse osteopenia. Cervical airway is patent.  IMPRESSION: 1. No acute intracranial abnormality. Stable atrophy and chronic white matter disease. Stable extensive right paranasal sinuses disease. There is scalp swelling and subcutaneous stranding in left frontal region. 2. No cervical spine acute fracture or subluxation. Stable degenerative changes as described above. Postsurgical changes again noted C1-C2 level. There is diffuse osteopenia.   Electronically Signed   By: Natasha Mead M.D.   On: 07/26/2013 17:51    EKG Interpretation   None       MDM   1. Fall at nursing home, initial encounter   2. Laceration of forehead, initial encounter      Plan discharge  Devoria Albe, MD, Franz Dell, MD 07/26/13 [phone]

## 2013-07-26 NOTE — ED Notes (Signed)
Per EMS, pt fell at [facility]. Has lac to left side of forhead

## 2013-09-30 ENCOUNTER — Emergency Department (HOSPITAL_COMMUNITY)
Admission: EM | Admit: 2013-09-30 | Discharge: 2013-09-30 | Disposition: A | Discharge status: Home / Self Care | Payer: Medicare Other | Attending: Emergency Medicine | Admitting: Emergency Medicine

## 2013-09-30 ENCOUNTER — Encounter (HOSPITAL_COMMUNITY): Payer: Self-pay | Admitting: Emergency Medicine

## 2013-09-30 DIAGNOSIS — Y9389 Activity, other specified: Secondary | ICD-10-CM | POA: Insufficient documentation

## 2013-09-30 DIAGNOSIS — Z880 Allergy status to penicillin: Secondary | ICD-10-CM | POA: Insufficient documentation

## 2013-09-30 DIAGNOSIS — I5033 Acute on chronic diastolic (congestive) heart failure: Secondary | ICD-10-CM | POA: Insufficient documentation

## 2013-09-30 DIAGNOSIS — N184 Chronic kidney disease, stage 4 (severe): Secondary | ICD-10-CM | POA: Insufficient documentation

## 2013-09-30 DIAGNOSIS — Z79899 Other long term (current) drug therapy: Secondary | ICD-10-CM | POA: Insufficient documentation

## 2013-09-30 DIAGNOSIS — H9190 Unspecified hearing loss, unspecified ear: Secondary | ICD-10-CM | POA: Insufficient documentation

## 2013-09-30 DIAGNOSIS — M810 Age-related osteoporosis without current pathological fracture: Secondary | ICD-10-CM | POA: Insufficient documentation

## 2013-09-30 DIAGNOSIS — R296 Repeated falls: Secondary | ICD-10-CM | POA: Insufficient documentation

## 2013-09-30 DIAGNOSIS — Y9210 Unspecified residential institution as the place of occurrence of the external cause: Secondary | ICD-10-CM | POA: Insufficient documentation

## 2013-09-30 DIAGNOSIS — W19XXXA Unspecified fall, initial encounter: Secondary | ICD-10-CM

## 2013-09-30 DIAGNOSIS — Z043 Encounter for examination and observation following other accident: Secondary | ICD-10-CM | POA: Insufficient documentation

## 2013-09-30 DIAGNOSIS — I129 Hypertensive chronic kidney disease with stage 1 through stage 4 chronic kidney disease, or unspecified chronic kidney disease: Secondary | ICD-10-CM | POA: Insufficient documentation

## 2013-09-30 DIAGNOSIS — F88 Other disorders of psychological development: Secondary | ICD-10-CM | POA: Insufficient documentation

## 2013-09-30 DIAGNOSIS — E039 Hypothyroidism, unspecified: Secondary | ICD-10-CM | POA: Insufficient documentation

## 2013-09-30 DIAGNOSIS — Z8781 Personal history of (healed) traumatic fracture: Secondary | ICD-10-CM | POA: Insufficient documentation

## 2013-09-30 DIAGNOSIS — F0280 Dementia in other diseases classified elsewhere without behavioral disturbance: Secondary | ICD-10-CM | POA: Insufficient documentation

## 2013-09-30 DIAGNOSIS — G309 Alzheimer's disease, unspecified: Secondary | ICD-10-CM | POA: Insufficient documentation

## 2013-09-30 DIAGNOSIS — Z853 Personal history of malignant neoplasm of breast: Secondary | ICD-10-CM | POA: Insufficient documentation

## 2013-09-30 LAB — URINALYSIS, ROUTINE W REFLEX MICROSCOPIC
Bilirubin Urine: NEGATIVE
GLUCOSE, UA: NEGATIVE mg/dL
KETONES UR: NEGATIVE mg/dL
NITRITE: NEGATIVE
PH: 6.5 (ref 5.0–8.0)
Protein, ur: 100 mg/dL — AB
Specific Gravity, Urine: 1.02 (ref 1.005–1.030)
Urobilinogen, UA: 0.2 mg/dL (ref 0.0–1.0)

## 2013-09-30 LAB — URINE MICROSCOPIC-ADD ON

## 2013-09-30 NOTE — ED Notes (Signed)
Discharge instructions reviewed with pt, questions answered. Pt verbalized understanding.  

## 2013-09-30 NOTE — ED Notes (Signed)
Pt sent from [facility], was found in floor by staff, unsure if pt fell but had to send pt to ER to be evaluated per protocol. Pt has Alzheimer's.

## 2013-09-30 NOTE — Discharge Instructions (Signed)
No x-rays were necessary. Urinalysis will be cultured. Followup with primary care Dr.

## 2013-09-30 NOTE — ED Provider Notes (Signed)
LEVEL 5 CAVEAT: Alzheimer's CSN: [id number]     Arrival date & time 09/30/13  1528 History  This chart was scribed for Martha Christen, MD,  by Stacy Gardner, ED Scribe. The patient was seen in room APA02/APA02 and the patient's care was started at 3:39 PM.    First MD Initiated Contact with Patient 09/30/13 1531     Chief Complaint  Patient presents with  . Fall     (Consider location/radiation/quality/duration/timing/severity/associated sxs/prior Treatment) Patient is a 80 y.o. female presenting with fall. The history is provided by medical records, the nursing home and the EMS personnel. No language interpreter was used.  Fall   HPI Comments: Martha Pearson is a 80 y.o. female, [place] resident who presents to the Emergency Department via EMS after she was found on a floor by the staff. The staff is unsure if pt fell and they sent her to the ED to be evaluated. Pt is non verbal and hard of hearing. No complaints of head or neck pain or extremity pain. Past Medical History  Diagnosis Date  . Hypertension   . Thyroid disease     hypothyroid  . Cancer     breast  . Hypothyroid   . Alzheimer disease   . Osteoporosis   . Hip fracture   . LVH (left ventricular hypertrophy)   . Acute on chronic diastolic CHF (congestive heart failure) 07/09/2012.  Marland Kitchen CKD (chronic kidney disease) stage 4, GFR 15-29 ml/min 09/23/2011  . Dementia    Past Surgical History  Procedure Laterality Date  . Mastectomy      right   History reviewed. No pertinent family history. History  Substance Use Topics  . Smoking status: Never Smoker   . Smokeless tobacco: Never Used  . Alcohol Use: No   OB History   Grav Para Term Preterm Abortions TAB SAB Ect Mult Living                 Review of Systems  Unable to perform ROS: Dementia  All other systems reviewed and are negative.      Allergies  Ace inhibitors; Penicillins; and Sulfa antibiotics  Home Medications   Current Outpatient Rx   Name  Route  Sig  Dispense  Refill  . acetaminophen (TYLENOL) 325 MG tablet   Oral   Take 2 tablets (650 mg total) by mouth every 6 (six) hours as needed for fever.         Marland Kitchen amLODipine (NORVASC) 10 MG tablet   Oral   Take 10 mg by mouth daily.           . carvedilol (COREG) 6.25 MG tablet   Oral   Take 1 tablet (6.25 mg total) by mouth 2 (two) times daily with a meal.         . cholecalciferol (VITAMIN D) 1000 UNITS tablet   Oral   Take 1,000 Units by mouth 2 (two) times daily.         Marland Kitchen ENSURE (ENSURE)   Oral   Take 237 mLs by mouth 2 (two) times daily.          Marland Kitchen guaiFENesin-dextromethorphan (ROBITUSSIN DM) 100-10 MG/5ML syrup   Oral   Take 5 mLs by mouth 3 (three) times daily as needed for cough.         . levothyroxine (SYNTHROID, LEVOTHROID) 75 MCG tablet   Oral   Take 75 mcg by mouth daily.         Marland Kitchen loperamide (  IMODIUM) 2 MG capsule   Oral   Take 2 mg by mouth 4 (four) times daily as needed for diarrhea or loose stools.         Marland Kitchen LORazepam (ATIVAN) 0.5 MG tablet   Oral   Take 0.25 mg by mouth every 4 (four) hours as needed for anxiety.          Marland Kitchen omeprazole (PRILOSEC) 20 MG capsule   Oral   Take 20 mg by mouth daily.         . polyethylene glycol (MIRALAX / GLYCOLAX) packet   Oral   Take 17 g by mouth daily. constipation         . promethazine (PHENERGAN) 25 MG tablet   Oral   Take 25 mg by mouth every 6 (six) hours as needed for nausea.         . sertraline (ZOLOFT) 25 MG tablet   Oral   Take 25 mg by mouth daily.           Marland Kitchen zinc oxide 20 % ointment   Topical   Apply 1 application topically 3 (three) times daily as needed. *One application applied to affected area(s) of buttocks three times daily as needed for redness          BP 164/81  Pulse 86  Temp(Src) 97.7 F (36.5 C) (Oral)  Resp 20  Ht 5\' 4"  (1.626 m)  Wt 110 lb (49.896 kg)  BMI 18.87 kg/m2  SpO2 94% Physical Exam  Nursing note and vitals  reviewed. Constitutional: She is oriented to person, place, and time. She appears well-developed and well-nourished.  HENT:  Head: Normocephalic and atraumatic.  Eyes: Conjunctivae and EOM are normal. Pupils are equal, round, and reactive to light.  Neck: Normal range of motion. Neck supple.  Cardiovascular: Normal rate, regular rhythm and normal heart sounds.   Pulmonary/Chest: Effort normal and breath sounds normal.  Abdominal: Soft. Bowel sounds are normal.  Musculoskeletal:  unable  Neurological: She is alert and oriented to person, place, and time.  unable  Skin: Skin is warm and dry.  Psychiatric: She has a normal mood and affect. Her behavior is normal.  demented    ED Course  Procedures (including critical care time) DIAGNOSTIC STUDIES: Oxygen Saturation is 94% on room air, normal by my interpretation.       Labs Review Labs Reviewed - No data to display Imaging Review No results found.  EKG Interpretation   None       MDM  Power of attorney confirms normal behavior. No bony tenderness. Urinalysis shows 3-6 white cells.  Will hold antibiotic and culture urine. I personally performed the services described in this documentation, which was scribed in my presence. The recorded information has been reviewed and is accurate.   Final diagnoses:  None        Martha Christen, MD 09/30/13 [phone]

## 2013-10-02 LAB — URINE CULTURE: Colony Count: >=100000

## 2013-10-27 ENCOUNTER — Emergency Department (HOSPITAL_COMMUNITY): Payer: Medicare Other

## 2013-10-27 ENCOUNTER — Emergency Department (HOSPITAL_COMMUNITY)
Admission: EM | Admit: 2013-10-27 | Discharge: 2013-10-27 | Disposition: A | Discharge status: Home / Self Care | Payer: Medicare Other | Attending: Emergency Medicine | Admitting: Emergency Medicine

## 2013-10-27 ENCOUNTER — Encounter (HOSPITAL_COMMUNITY): Payer: Self-pay | Admitting: Emergency Medicine

## 2013-10-27 DIAGNOSIS — N184 Chronic kidney disease, stage 4 (severe): Secondary | ICD-10-CM | POA: Insufficient documentation

## 2013-10-27 DIAGNOSIS — G309 Alzheimer's disease, unspecified: Secondary | ICD-10-CM | POA: Insufficient documentation

## 2013-10-27 DIAGNOSIS — Z880 Allergy status to penicillin: Secondary | ICD-10-CM | POA: Insufficient documentation

## 2013-10-27 DIAGNOSIS — E039 Hypothyroidism, unspecified: Secondary | ICD-10-CM | POA: Insufficient documentation

## 2013-10-27 DIAGNOSIS — Y939 Activity, unspecified: Secondary | ICD-10-CM | POA: Insufficient documentation

## 2013-10-27 DIAGNOSIS — Z79899 Other long term (current) drug therapy: Secondary | ICD-10-CM | POA: Insufficient documentation

## 2013-10-27 DIAGNOSIS — Z23 Encounter for immunization: Secondary | ICD-10-CM | POA: Insufficient documentation

## 2013-10-27 DIAGNOSIS — I10 Essential (primary) hypertension: Secondary | ICD-10-CM | POA: Insufficient documentation

## 2013-10-27 DIAGNOSIS — W06XXXA Fall from bed, initial encounter: Secondary | ICD-10-CM | POA: Insufficient documentation

## 2013-10-27 DIAGNOSIS — I129 Hypertensive chronic kidney disease with stage 1 through stage 4 chronic kidney disease, or unspecified chronic kidney disease: Secondary | ICD-10-CM | POA: Insufficient documentation

## 2013-10-27 DIAGNOSIS — S0181XA Laceration without foreign body of other part of head, initial encounter: Secondary | ICD-10-CM

## 2013-10-27 DIAGNOSIS — F0280 Dementia in other diseases classified elsewhere without behavioral disturbance: Secondary | ICD-10-CM | POA: Insufficient documentation

## 2013-10-27 DIAGNOSIS — Z853 Personal history of malignant neoplasm of breast: Secondary | ICD-10-CM | POA: Insufficient documentation

## 2013-10-27 DIAGNOSIS — I5033 Acute on chronic diastolic (congestive) heart failure: Secondary | ICD-10-CM | POA: Insufficient documentation

## 2013-10-27 DIAGNOSIS — Z8781 Personal history of (healed) traumatic fracture: Secondary | ICD-10-CM | POA: Insufficient documentation

## 2013-10-27 DIAGNOSIS — S0180XA Unspecified open wound of other part of head, initial encounter: Secondary | ICD-10-CM | POA: Insufficient documentation

## 2013-10-27 DIAGNOSIS — M810 Age-related osteoporosis without current pathological fracture: Secondary | ICD-10-CM | POA: Insufficient documentation

## 2013-10-27 DIAGNOSIS — Y9210 Unspecified residential institution as the place of occurrence of the external cause: Secondary | ICD-10-CM | POA: Insufficient documentation

## 2013-10-27 MED ORDER — TETANUS-DIPHTH-ACELL PERTUSSIS 5-2.5-18.5 LF-MCG/0.5 IM SUSP
0.5000 mL | Freq: Once | INTRAMUSCULAR | Status: AC
  Administered 2013-10-27: 0.5 mL via INTRAMUSCULAR
  Filled 2013-10-27: qty 0.5

## 2013-10-27 NOTE — ED Notes (Signed)
Patient with no complaints at this time. Respirations even and unlabored. Skin warm/dry. Discharge instructions reviewed with patient at this time. Patient given opportunity to voice concerns/ask questions. Patient discharged at this time and left Emergency Department with steady gait.   

## 2013-10-27 NOTE — ED Provider Notes (Signed)
CSN: [id number]     Arrival date & time 10/27/13  0141 History   First MD Initiated Contact with Patient 10/27/13 0149     Chief Complaint  Patient presents with  . Fall     (Consider location/radiation/quality/duration/timing/severity/associated sxs/prior Treatment) HPI Level 5 Caveat: dementia This is a 80 year old female with nursing home resident with dementia. She was found on the floor by her bed this morning by staff, presumably having fallen from bed. She was noted to have a laceration to her left forehead. She is otherwise awake and alert at her neurologic baseline. She was fully spinal immobilized by EMS prior to transport. They noted no obvious deformities apart from the laceration.  Past Medical History  Diagnosis Date  . Hypertension   . Thyroid disease     hypothyroid  . Cancer     breast  . Hypothyroid   . Alzheimer disease   . Osteoporosis   . Hip fracture   . LVH (left ventricular hypertrophy)   . Acute on chronic diastolic CHF (congestive heart failure) 07/09/2012.  Marland Kitchen CKD (chronic kidney disease) stage 4, GFR 15-29 ml/min 09/23/2011  . Dementia    Past Surgical History  Procedure Laterality Date  . Mastectomy      right   History reviewed. No pertinent family history. History  Substance Use Topics  . Smoking status: Never Smoker   . Smokeless tobacco: Never Used  . Alcohol Use: No   OB History   Grav Para Term Preterm Abortions TAB SAB Ect Mult Living                 Review of Systems  Unable to perform ROS     Allergies  Ace inhibitors; Penicillins; and Sulfa antibiotics  Home Medications   Current Outpatient Rx  Name  Route  Sig  Dispense  Refill  . acetaminophen (TYLENOL) 325 MG tablet   Oral   Take 2 tablets (650 mg total) by mouth every 6 (six) hours as needed for fever.         Marland Kitchen amLODipine (NORVASC) 10 MG tablet   Oral   Take 10 mg by mouth daily.           . carvedilol (COREG) 6.25 MG tablet   Oral   Take 1 tablet (6.25  mg total) by mouth 2 (two) times daily with a meal.         . cholecalciferol (VITAMIN D) 1000 UNITS tablet   Oral   Take 1,000 Units by mouth 2 (two) times daily.         Marland Kitchen ENSURE (ENSURE)   Oral   Take 237 mLs by mouth 2 (two) times daily.          . furosemide (LASIX) 20 MG tablet   Oral   Take 20 mg by mouth daily as needed for fluid.         Marland Kitchen guaiFENesin-dextromethorphan (ROBITUSSIN DM) 100-10 MG/5ML syrup   Oral   Take 5 mLs by mouth 3 (three) times daily as needed for cough.         . levothyroxine (SYNTHROID, LEVOTHROID) 75 MCG tablet   Oral   Take 75 mcg by mouth daily.         Marland Kitchen loperamide (IMODIUM) 2 MG capsule   Oral   Take 2 mg by mouth 4 (four) times daily as needed for diarrhea or loose stools.         Marland Kitchen LORazepam (ATIVAN) 0.5 MG tablet  Oral   Take 0.25 mg by mouth every 4 (four) hours as needed for anxiety.          Marland Kitchen omeprazole (PRILOSEC) 20 MG capsule   Oral   Take 20 mg by mouth daily.         . polyethylene glycol (MIRALAX / GLYCOLAX) packet   Oral   Take 17 g by mouth daily. constipation         . promethazine (PHENERGAN) 25 MG tablet   Oral   Take 25 mg by mouth every 6 (six) hours as needed for nausea.         . sertraline (ZOLOFT) 25 MG tablet   Oral   Take 25 mg by mouth daily.           Marland Kitchen zinc oxide 20 % ointment   Topical   Apply 1 application topically 3 (three) times daily as needed. *One application applied to affected area(s) of buttocks three times daily as needed for redness          BP 156/95  Pulse 65  Resp 17  SpO2 94%  Physical Exam General: Well-developed, well-nourished female in no acute distress; appearance consistent with age of record; immobilized on spine HENT: normocephalic; irregular laceration left forehead Eyes: pupils equal, round and reactive to light; extraocular muscles intact Neck: Immobilized in cervical collar; trachea midline Heart: regular rate and rhythm Lungs: clear to  auscultation bilaterally Abdomen: soft; nondistended; nontender; bowel sounds present Back: No thoracic or lumbar spinal tenderness Extremities: Arthritic changes; large bunion of left great toe; trace edema of lower legs and feet; no tenderness on passive range of motion Neurologic: Awake, alert; incoherent speech; motor function intact in all extremities and symmetric; no facial droop Skin: Warm and dry    ED Course  Procedures (including critical care time)  LACERATION REPAIR Performed by: Wynetta Fines Authorized by: Wynetta Fines Consent: Verbal consent obtained. Risks and benefits: risks, benefits and alternatives were discussed Consent given by: patient Patient identity confirmed: provided demographic data Prepped and Draped in normal sterile fashion Wound explored  Laceration Location: Left forehead  Laceration Length: 3 cm  No Foreign Bodies seen or palpated  Anesthesia: None   Irrigation method: syringe Amount of cleaning: standard  Skin closure: Staples   Number of sutures: 4   Patient tolerance: Patient tolerated the procedure well with no immediate complications.   MDM  Nursing notes and vitals signs, including pulse oximetry, reviewed.  Summary of this visit's results, reviewed by myself:  Labs:  No results found for this or any previous visit (from the past 24 hour(s)).  Imaging Studies: Ct Head Wo Contrast  10/27/2013   CLINICAL DATA:  Fall out of bed, forehead laceration.  EXAM: CT HEAD WITHOUT CONTRAST  CT CERVICAL SPINE WITHOUT CONTRAST  TECHNIQUE: Multidetector CT imaging of the head and cervical spine was performed following the standard protocol without intravenous contrast. Multiplanar CT image reconstructions of the cervical spine were also generated.  COMPARISON:  CT HEAD W/O CM dated 07/26/2013  FINDINGS: CT HEAD FINDINGS  Moderate to severe ventriculomegaly, likely on the basis of global parenchymal brain volume loss as there is overall  commensurate enlargement of cerebral sulci and cerebellar folia, similar with mild sulcal effacement of the convexities. No intraparenchymal hemorrhage, mass effect nor midline shift. Confluent supratentorial white matter hypodensities are within normal range for patient's age and though non-specific suggest sequelae of chronic small vessel ischemic disease. No acute large vascular territory infarcts. Remote  left basal ganglia lacunar infarcts again seen.  No abnormal extra-axial fluid collections. Basal cisterns are patent. Moderate calcific atherosclerosis of the carotid siphons.  Chronic right maxillary sinusitis, with paranasal sinus mucosal thickening. Mastoid air cells are well aerated. Moderate temporomandibular osteoarthrosis. No skull fracture. Ocular globes and orbital contents are nonsuspicious. The included ocular globes and orbital contents are non-suspicious.  CT CERVICAL SPINE FINDINGS  Cervical vertebral bodies are intact and aligned with maintenance of cervical lordosis. However, there is grade 1 C7-T1 anterolisthesis, unchanged without spondylolysis. Moderate to severe degenerative disc disease C2-3 through C6-7. No destructive bony lesions. Osteopenia. C1-2 posterior fusion, lower hardware results in some streak artifact. Mild levoscoliosis. Bone mineral density decreased without destructive bony lesions. C1-2 articulation maintained. Moderate calcific atherosclerosis of the carotid bulbs, medialized course on the right.  IMPRESSION: CT head:  No acute intracranial process.  Severe brain atrophy, with mild sulcal effacement at the convexities which may reflect superimposed normal pressure hydrocephalus.  Severe white matter changes with remote left basal ganglia lacunar infarcts.  CT cervical spine: No acute cervical spine fracture nor malalignment. C1-2 posterior fusion.   Electronically Signed   By: Elon Alas   On: 10/27/2013 02:52   Ct Cervical Spine Wo Contrast  10/27/2013   CLINICAL  DATA:  Fall out of bed, forehead laceration.  EXAM: CT HEAD WITHOUT CONTRAST  CT CERVICAL SPINE WITHOUT CONTRAST  TECHNIQUE: Multidetector CT imaging of the head and cervical spine was performed following the standard protocol without intravenous contrast. Multiplanar CT image reconstructions of the cervical spine were also generated.  COMPARISON:  CT HEAD W/O CM dated 07/26/2013  FINDINGS: CT HEAD FINDINGS  Moderate to severe ventriculomegaly, likely on the basis of global parenchymal brain volume loss as there is overall commensurate enlargement of cerebral sulci and cerebellar folia, similar with mild sulcal effacement of the convexities. No intraparenchymal hemorrhage, mass effect nor midline shift. Confluent supratentorial white matter hypodensities are within normal range for patient's age and though non-specific suggest sequelae of chronic small vessel ischemic disease. No acute large vascular territory infarcts. Remote left basal ganglia lacunar infarcts again seen.  No abnormal extra-axial fluid collections. Basal cisterns are patent. Moderate calcific atherosclerosis of the carotid siphons.  Chronic right maxillary sinusitis, with paranasal sinus mucosal thickening. Mastoid air cells are well aerated. Moderate temporomandibular osteoarthrosis. No skull fracture. Ocular globes and orbital contents are nonsuspicious. The included ocular globes and orbital contents are non-suspicious.  CT CERVICAL SPINE FINDINGS  Cervical vertebral bodies are intact and aligned with maintenance of cervical lordosis. However, there is grade 1 C7-T1 anterolisthesis, unchanged without spondylolysis. Moderate to severe degenerative disc disease C2-3 through C6-7. No destructive bony lesions. Osteopenia. C1-2 posterior fusion, lower hardware results in some streak artifact. Mild levoscoliosis. Bone mineral density decreased without destructive bony lesions. C1-2 articulation maintained. Moderate calcific atherosclerosis of the  carotid bulbs, medialized course on the right.  IMPRESSION: CT head:  No acute intracranial process.  Severe brain atrophy, with mild sulcal effacement at the convexities which may reflect superimposed normal pressure hydrocephalus.  Severe white matter changes with remote left basal ganglia lacunar infarcts.  CT cervical spine: No acute cervical spine fracture nor malalignment. C1-2 posterior fusion.   Electronically Signed   By: Elon Alas   On: 10/27/2013 02:52        Wynetta Fines, MD 10/27/13 3244

## 2013-10-27 NOTE — ED Notes (Signed)
Attempted steri strips, but wound is too moist w/blood.  Notified Dr. Florina Ou who placed 5 staples.

## 2013-10-27 NOTE — ED Notes (Signed)
Patient reportedly fell in room at [facility], unobserved.  Patient is demented.

## 2013-10-27 NOTE — ED Notes (Signed)
CT scan verifies no cervical fracture of subluxation.  Cspine collar removed.  Cleaned laceration over L eye w/wound cleanser.  Patient screamed the entire time.

## 2013-10-27 NOTE — Discharge Instructions (Signed)
Facial Laceration ° A facial laceration is a cut on the face. These injuries can be painful and cause bleeding. Lacerations usually heal quickly, but they need special care to reduce scarring. °DIAGNOSIS  °Your health care provider will take a medical history, ask for details about how the injury occurred, and examine the wound to determine how deep the cut is. °TREATMENT  °Some facial lacerations may not require closure. Others may not be able to be closed because of an increased risk of infection. The risk of infection and the chance for successful closure will depend on various factors, including the amount of time since the injury occurred. °The wound may be cleaned to help prevent infection. If closure is appropriate, pain medicines may be given if needed. Your health care provider will use stitches (sutures), wound glue (adhesive), or skin adhesive strips to repair the laceration. These tools bring the skin edges together to allow for faster healing and a better cosmetic outcome. If needed, you may also be given a tetanus shot. °HOME CARE INSTRUCTIONS °· Only take over-the-counter or prescription medicines as directed by your health care provider. °· Follow your health care provider's instructions for wound care. These instructions will vary depending on the technique used for closing the wound. ° °For Sutures: °· Keep the wound clean and dry.   °· If you were given a bandage (dressing), you should change it at least once a day. Also change the dressing if it becomes wet or dirty, or as directed by your health care provider.   °· Wash the wound with soap and water 2 times a day. Rinse the wound off with water to remove all soap. Pat the wound dry with a clean towel.   °· After cleaning, apply a thin layer of the antibiotic ointment recommended by your health care provider. This will help prevent infection and keep the dressing from sticking.   °· You may shower as usual after the first 24 hours. Do not soak  the wound in water until the sutures are removed.   °· Get your sutures removed as directed by your health care provider. With facial lacerations, sutures should usually be taken out after 4 5 days to avoid stitch marks.   °· Wait a few days after your sutures are removed before applying any makeup. ° °After Healing: °Once the wound has healed, cover the wound with sunscreen during the day for 1 full year. This can help minimize scarring. Exposure to ultraviolet light in the first year will darken the scar. It can take 1 2 years for the scar to lose its redness and to heal completely.  °SEEK IMMEDIATE MEDICAL CARE IF: °· You have redness, pain, or swelling around the wound.   °· You see a yellowish-white fluid (pus) coming from the wound.   °· You have chills or a fever.   °MAKE SURE YOU: °· Understand these instructions. °· Will watch your condition. °· Will get help right away if you are not doing well or get worse. °Document Released: 09/14/2004 Document Revised: 05/28/2013 Document Reviewed: 03/20/2013 °[employer]® Patient Information ©2014 [employer]. ° °

## 2013-11-02 ENCOUNTER — Encounter (HOSPITAL_COMMUNITY): Payer: Self-pay | Admitting: Emergency Medicine

## 2013-11-02 ENCOUNTER — Emergency Department (HOSPITAL_COMMUNITY): Payer: Medicare Other

## 2013-11-02 ENCOUNTER — Emergency Department (HOSPITAL_COMMUNITY)
Admission: EM | Admit: 2013-11-02 | Discharge: 2013-11-03 | Disposition: A | Discharge status: Home / Self Care | Payer: Medicare Other | Attending: Emergency Medicine | Admitting: Emergency Medicine

## 2013-11-02 DIAGNOSIS — S60222A Contusion of left hand, initial encounter: Secondary | ICD-10-CM

## 2013-11-02 DIAGNOSIS — S60229A Contusion of unspecified hand, initial encounter: Secondary | ICD-10-CM | POA: Insufficient documentation

## 2013-11-02 DIAGNOSIS — F0280 Dementia in other diseases classified elsewhere without behavioral disturbance: Secondary | ICD-10-CM | POA: Insufficient documentation

## 2013-11-02 DIAGNOSIS — W19XXXA Unspecified fall, initial encounter: Secondary | ICD-10-CM | POA: Insufficient documentation

## 2013-11-02 DIAGNOSIS — S92309A Fracture of unspecified metatarsal bone(s), unspecified foot, initial encounter for closed fracture: Secondary | ICD-10-CM | POA: Insufficient documentation

## 2013-11-02 DIAGNOSIS — Y9210 Unspecified residential institution as the place of occurrence of the external cause: Secondary | ICD-10-CM | POA: Insufficient documentation

## 2013-11-02 DIAGNOSIS — N184 Chronic kidney disease, stage 4 (severe): Secondary | ICD-10-CM | POA: Insufficient documentation

## 2013-11-02 DIAGNOSIS — Z880 Allergy status to penicillin: Secondary | ICD-10-CM | POA: Insufficient documentation

## 2013-11-02 DIAGNOSIS — I5033 Acute on chronic diastolic (congestive) heart failure: Secondary | ICD-10-CM | POA: Insufficient documentation

## 2013-11-02 DIAGNOSIS — E039 Hypothyroidism, unspecified: Secondary | ICD-10-CM | POA: Insufficient documentation

## 2013-11-02 DIAGNOSIS — Z8781 Personal history of (healed) traumatic fracture: Secondary | ICD-10-CM | POA: Insufficient documentation

## 2013-11-02 DIAGNOSIS — G309 Alzheimer's disease, unspecified: Secondary | ICD-10-CM | POA: Insufficient documentation

## 2013-11-02 DIAGNOSIS — Z853 Personal history of malignant neoplasm of breast: Secondary | ICD-10-CM | POA: Insufficient documentation

## 2013-11-02 DIAGNOSIS — Z79899 Other long term (current) drug therapy: Secondary | ICD-10-CM | POA: Insufficient documentation

## 2013-11-02 DIAGNOSIS — Z8739 Personal history of other diseases of the musculoskeletal system and connective tissue: Secondary | ICD-10-CM | POA: Insufficient documentation

## 2013-11-02 DIAGNOSIS — I129 Hypertensive chronic kidney disease with stage 1 through stage 4 chronic kidney disease, or unspecified chronic kidney disease: Secondary | ICD-10-CM | POA: Insufficient documentation

## 2013-11-02 DIAGNOSIS — Y9389 Activity, other specified: Secondary | ICD-10-CM | POA: Insufficient documentation

## 2013-11-02 NOTE — ED Notes (Signed)
Patient presents to ED via [facility] with c/o right foot injury and left index and middle finger with unknown injury.  Staff at [facility] told EMS they think she may have run over foot with her wheelchair.  Patient cries out in pain whenever touched or moved.

## 2013-11-02 NOTE — ED Provider Notes (Signed)
CSN: [id number]     Arrival date & time 11/02/13  2156 History   First MD Initiated Contact with Patient 11/02/13 2219     Chief Complaint  Patient presents with  . Foot Injury     (Consider location/radiation/quality/duration/timing/severity/associated sxs/prior Treatment) Patient is a 80 y.o. female presenting with fall. The history is provided by the EMS personnel and the nursing home. No language interpreter was used.  Fall This is a new problem. The current episode started today. Episode frequency: unknown. The problem has been unchanged. Associated symptoms comments: unable. Exacerbated by: movement. She has tried nothing for the symptoms. Improvement on treatment: no treatment.  pt is sent here from nursing home with swelling in her right foot and bruise and cut to her left hand.  Pt has a history of frequent falls.  Pt unable to give any history Past Medical History  Diagnosis Date  . Hypertension   . Thyroid disease     hypothyroid  . Cancer     breast  . Hypothyroid   . Alzheimer disease   . Osteoporosis   . Hip fracture   . LVH (left ventricular hypertrophy)   . Acute on chronic diastolic CHF (congestive heart failure) 07/09/2012.  Marland Kitchen CKD (chronic kidney disease) stage 4, GFR 15-29 ml/min 09/23/2011  . Dementia    Past Surgical History  Procedure Laterality Date  . Mastectomy      right   No family history on file. History  Substance Use Topics  . Smoking status: Never Smoker   . Smokeless tobacco: Never Used  . Alcohol Use: No   OB History   Grav Para Term Preterm Abortions TAB SAB Ect Mult Living                 Review of Systems  All other systems reviewed and are negative.      Allergies  Ace inhibitors; Penicillins; and Sulfa antibiotics  Home Medications   Current Outpatient Rx  Name  Route  Sig  Dispense  Refill  . amLODipine (NORVASC) 10 MG tablet   Oral   Take 10 mg by mouth daily.         . carvedilol (COREG) 6.25 MG tablet    Oral   Take 1 tablet (6.25 mg total) by mouth 2 (two) times daily with a meal.         . cholecalciferol (VITAMIN D) 1000 UNITS tablet   Oral   Take 1,000 Units by mouth 2 (two) times daily.         . divalproex (DEPAKOTE) 125 MG DR tablet   Oral   Take 125 mg by mouth at bedtime.         . ENSURE (ENSURE)   Oral   Take 237 mLs by mouth 2 (two) times daily.          Marland Kitchen levothyroxine (SYNTHROID, LEVOTHROID) 88 MCG tablet   Oral   Take 88 mcg by mouth daily.         Marland Kitchen omeprazole (PRILOSEC) 20 MG capsule   Oral   Take 20 mg by mouth daily.         . polyethylene glycol (MIRALAX / GLYCOLAX) packet   Oral   Take 17 g by mouth daily. constipation         . sertraline (ZOLOFT) 25 MG tablet   Oral   Take 25 mg by mouth daily.           Marland Kitchen acetaminophen (TYLENOL)  325 MG tablet   Oral   Take 2 tablets (650 mg total) by mouth every 6 (six) hours as needed for fever.         . furosemide (LASIX) 20 MG tablet   Oral   Take 20 mg by mouth daily as needed for fluid. Weight gain of 3 pounds in 24 hours         . guaiFENesin-dextromethorphan (ROBITUSSIN DM) 100-10 MG/5ML syrup   Oral   Take 5 mLs by mouth 3 (three) times daily as needed for cough.         . loperamide (IMODIUM) 2 MG capsule   Oral   Take 2 mg by mouth 4 (four) times daily as needed for diarrhea or loose stools.         Marland Kitchen LORazepam (ATIVAN) 0.5 MG tablet   Oral   Take 0.25 mg by mouth every 4 (four) hours as needed for anxiety.          . promethazine (PHENERGAN) 25 MG tablet   Oral   Take 25 mg by mouth every 6 (six) hours as needed for nausea.         Marland Kitchen zinc oxide 20 % ointment   Topical   Apply 1 application topically 3 (three) times daily as needed. *One application applied to affected area(s) of buttocks three times daily as needed for redness          BP 158/63  Pulse 87  Temp(Src) 98 F (36.7 C) (Oral)  Resp 20  SpO2 100% Physical Exam  Nursing note and vitals  reviewed. Constitutional: She appears well-developed and well-nourished.  HENT:  Head: Normocephalic.  Old abrasions and healing lacerations,    Eyes: Pupils are equal, round, and reactive to light.  Neck: Normal range of motion.  Cardiovascular: Normal rate and normal heart sounds.   Pulmonary/Chest: Effort normal.  Musculoskeletal: She exhibits tenderness.  Swollen bruised right foot.   Tender to touch   Scabbed laceration left hand,  Bruised hand  Neurological: She is alert.  Skin: Skin is warm.  Psychiatric: She has a normal mood and affect.    ED Course  Procedures (including critical care time) Labs Review Labs Reviewed - No data to display Imaging Review Dg Tibia/fibula Right  11/02/2013   CLINICAL DATA:  Injury.  Right lower leg pain.  EXAM: RIGHT TIBIA AND FIBULA - 2 VIEW  COMPARISON:  None.  FINDINGS: There is no fracture or dislocation. No focal bony lesion is identified.  IMPRESSION: No acute abnormality.   Electronically Signed   By: Inge Rise M.D.   On: 11/02/2013 23:12   Dg Hand Complete Left  11/02/2013   CLINICAL DATA:  Swelling of the fingers.  Injury, left hand pain.  EXAM: LEFT HAND - COMPLETE 3+ VIEW  COMPARISON:  None.  FINDINGS: No acute bony or joint abnormality is identified. There is some degenerative change about the first [facility] joint. Soft tissues are unremarkable.  IMPRESSION: No acute finding.   Electronically Signed   By: Inge Rise M.D.   On: 11/02/2013 23:13   Dg Foot Complete Right  11/02/2013   CLINICAL DATA:  Injury, pain.  EXAM: RIGHT FOOT COMPLETE - 3+ VIEW  COMPARISON:  None.  Marland Kitchen  FINDINGS: There soft tissue swelling about the right foot. The patient has a nondisplaced fracture through the proximal diaphysis of the first metatarsal. No other acute bony or joint abnormality is identified. Midfoot degenerative change is noted.  IMPRESSION: Nondisplaced  fracture first metatarsal.   Electronically Signed   By: Inge Rise M.D.   On:  11/02/2013 23:13     EKG Interpretation None      MDM   Final diagnoses:  Fracture of metatarsal bone  Contusion of hand, left   Foot  Nondisplaced metatarsal fx.  Hand no fracture Pt placed in a post op shoe and ace wrap.   Pt is wheelchair bound.  No ambulation    [place], [place] 11/02/13 2359

## 2013-11-02 NOTE — Discharge Instructions (Signed)
Hand Contusion A hand contusion is a deep bruise on your hand area. Contusions are the result of an injury that caused bleeding under the skin. The contusion may turn blue, purple, or yellow. Minor injuries will give you a painless contusion, but more severe contusions may stay painful and swollen for a few weeks. CAUSES  A contusion is usually caused by a blow, trauma, or direct force to an area of the body. SYMPTOMS   Swelling and redness of the injured area.  Discoloration of the injured area.  Tenderness and soreness of the injured area.  Pain. DIAGNOSIS  The diagnosis can be made by taking a history and performing a physical exam. An X-ray, CT scan, or MRI may be needed to determine if there were any associated injuries, such as broken bones (fractures). TREATMENT  Often, the best treatment for a hand contusion is resting, elevating, icing, and applying cold compresses to the injured area. Over-the-counter medicines may also be recommended for pain control. HOME CARE INSTRUCTIONS   Put ice on the injured area.  Put ice in a plastic bag.  Place a towel between your skin and the bag.  Leave the ice on for 15-20 minutes, 03-04 times a day.  Only take over-the-counter or prescription medicines as directed by your caregiver. Your caregiver may recommend avoiding anti-inflammatory medicines (aspirin, ibuprofen, and naproxen) for 48 hours because these medicines may increase bruising.  If told, use an elastic wrap as directed. This can help reduce swelling. You may remove the wrap for sleeping, showering, and bathing. If your fingers become numb, cold, or blue, take the wrap off and reapply it more loosely.  Elevate your hand with pillows to reduce swelling.  Avoid overusing your hand if it is painful. SEEK IMMEDIATE MEDICAL CARE IF:   You have increased redness, swelling, or pain in your hand.  Your swelling or pain is not relieved with medicines.  You have loss of feeling in  your hand or are unable to move your fingers.  Your hand turns cold or blue.  You have pain when you move your fingers.  Your hand becomes warm to the touch.  Your contusion does not improve in 2 days. MAKE SURE YOU:   Understand these instructions.  Will watch your condition.  Will get help right away if you are not doing well or get worse. Document Released: 01/27/2002 Document Revised: 05/01/2012 Document Reviewed: 01/29/2012 [facility] Patient Information 2014 [place]. Metatarsal Fracture, Undisplaced A metatarsal fracture is a break in the bone(s) of the foot. These are the bones of the foot that connect your toes to the bones of the ankle. DIAGNOSIS  The diagnoses of these fractures are usually made with X-rays. If there are problems in the forefoot and x-rays are normal a later bone scan will usually make the diagnosis.  TREATMENT AND HOME CARE INSTRUCTIONS  Treatment may or may not include a cast or walking shoe. When casts are needed the use is usually for short periods of time so as not to slow down healing with muscle wasting (atrophy).  Activities should be stopped until further advised by your caregiver.  Wear shoes with adequate shock absorbing capabilities and stiff soles.  Alternative exercise may be undertaken while waiting for healing. These may include bicycling and swimming, or as your caregiver suggests.  It is important to keep all follow-up visits or specialty referrals. The failure to keep these appointments could result in improper bone healing and chronic pain or disability.  Warning: Do not drive a car or operate a motor vehicle until your caregiver specifically tells you it is safe to do so. IF YOU DO NOT HAVE A CAST OR SPLINT:  You may walk on your injured foot as tolerated or advised.  Do not put any weight on your injured foot for as long as directed by your caregiver. Slowly increase the amount of time you walk on the foot as the pain  allows or as advised.  Use crutches until you can bear weight without pain. A gradual increase in weight bearing may help.  Apply ice to the injury for 15-20 minutes each hour while awake for the first 2 days. Put the ice in a plastic bag and place a towel between the bag of ice and your skin.  Only take over-the-counter or prescription medicines for pain, discomfort, or fever as directed by your caregiver. SEEK IMMEDIATE MEDICAL CARE IF:   Your cast gets damaged or breaks.  You have continued severe pain or more swelling than you did before the cast was put on, or the pain is not controlled with medications.  Your skin or nails below the injury turn blue or grey, or feel cold or numb.  There is a bad smell, or new stains or pus-like (purulent) drainage coming from the cast. MAKE SURE YOU:   Understand these instructions.  Will watch your condition.  Will get help right away if you are not doing well or get worse. Document Released: 04/29/2002 Document Revised: 10/30/2011 Document Reviewed: 03/20/2008 [facility] Patient Information 2014 [place].

## 2013-11-03 NOTE — ED Provider Notes (Signed)
Medical screening examination/treatment/procedure(s) were conducted as a shared visit with non-physician practitioner(s) and myself.  I personally evaluated the patient during the encounter.  Right foot neurovascularly intact after splinting fractured metatarsal. Patient is severely demented and unable to carry on a conversation or comprehend her circumstances.    Wynetta Fines, MD 11/03/13 [phone]

## 2013-11-03 NOTE — ED Notes (Signed)
[place] staff here to transport pt back to facility.

## 2013-11-11 ENCOUNTER — Encounter: Payer: Self-pay | Admitting: Orthopedic Surgery

## 2013-11-11 ENCOUNTER — Ambulatory Visit (INDEPENDENT_AMBULATORY_CARE_PROVIDER_SITE_OTHER): Payer: Medicare Other | Admitting: Orthopedic Surgery

## 2013-11-11 VITALS — BP 140/84 | Ht 64.0 in | Wt 110.0 lb

## 2013-11-11 DIAGNOSIS — S92309A Fracture of unspecified metatarsal bone(s), unspecified foot, initial encounter for closed fracture: Secondary | ICD-10-CM | POA: Insufficient documentation

## 2013-11-11 NOTE — Progress Notes (Signed)
Patient ID: Martha Pearson, female   DOB: 10-15-1917, 80 y.o.   MRN: [id number]  Chief Complaint  Patient presents with  . Foot Pain    Right foot fracture, DOI 11-02-13.    HISTORY:  80 year old female presents with a right first metatarsal fracture. 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 She is in sterile in stage renal disease which leads the blood and urine stiffness and unsteady gait with anxiety are recorded on review of systems she has multiple allergies Alzheimer's disease dementia osteoporosis history of breast disease medications are Prilosec Norvasc Zoloft MiraLAX Coreg vitamin D3 Depakote Tylenol Ativan Lasix Imodium Phenergan Synthroid zinc oxide Robitussin insurer family history listed as negative social history normal patient is widowed  BP 140/84  Ht 5\' 4"  (1.626 m)  Wt 110 lb (49.896 kg)  BMI 18.87 kg/m2 The patient is not alert and oriented x3. Her mood is pleasant. She is nonambulatory. Her general appearance is well kept.  The left foot is tender at the first metatarsal it appears to have a supination deformity which is probably chronic from nonambulatory status. Range of motion at the ankle joint is limited ankle joint stable muscle tone is normal scans intact normal pulse normal sensation    X-rays show a first metatarsal fracture with osteopenia  Treatment postop shoe x-ray in 8 weeks

## 2013-11-21 IMAGING — CR DG ELBOW COMPLETE 3+V*R*
4 series · 4 of 4 positions shown · non-contrast
Comparison: None.

CLINICAL DATA: Fall.  Elbow injury and pain.

RIGHT ELBOW - COMPLETE 3+ VIEW

[view not recorded (1 of 4)]
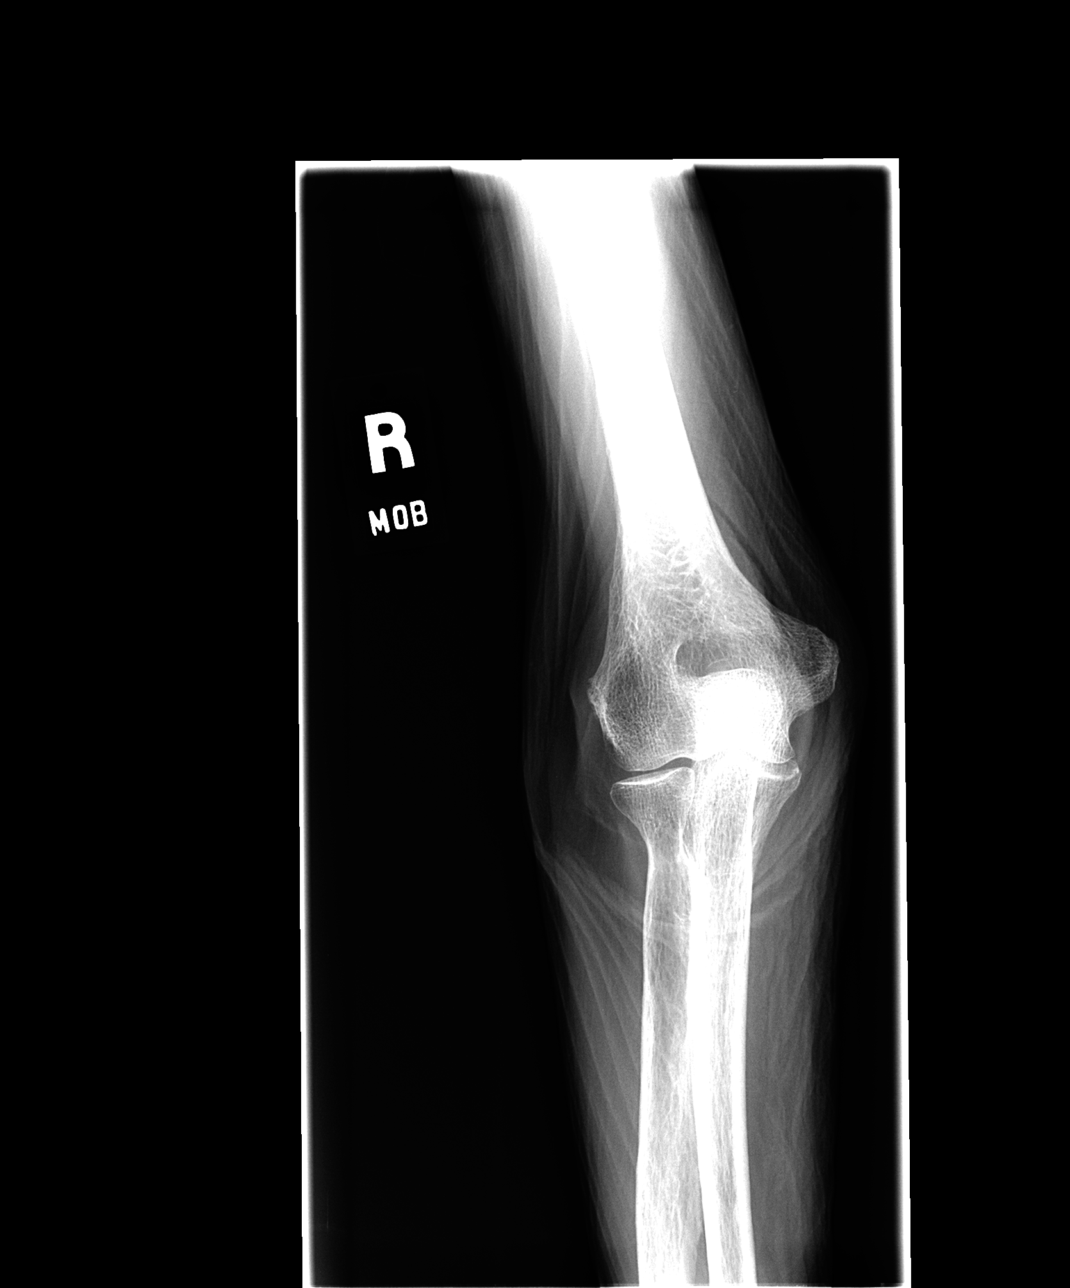

[view not recorded (2 of 4)]
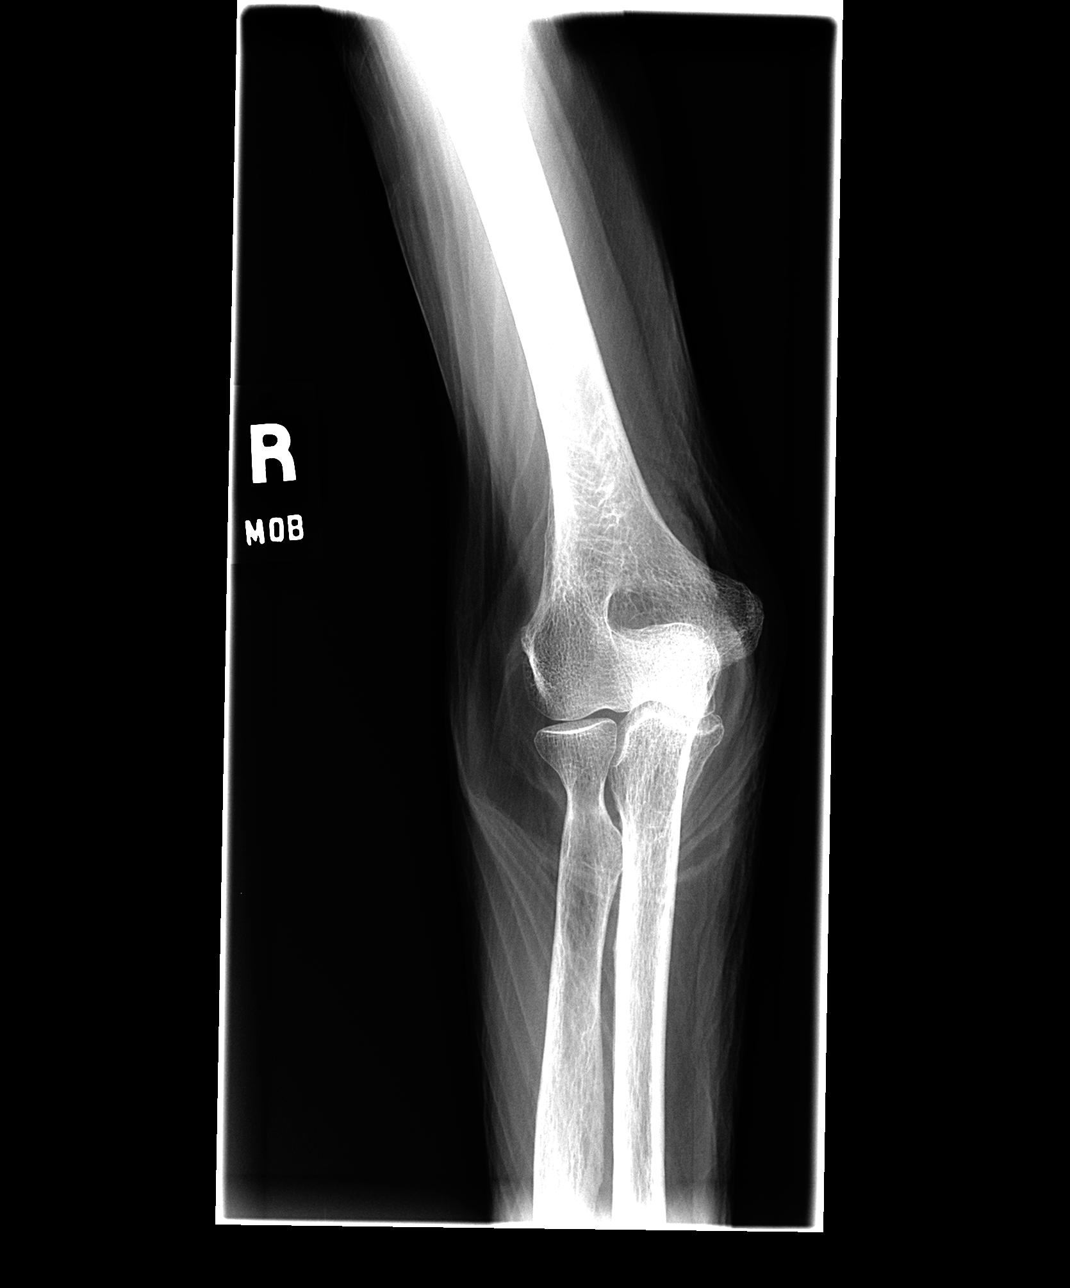

[view not recorded (3 of 4)]
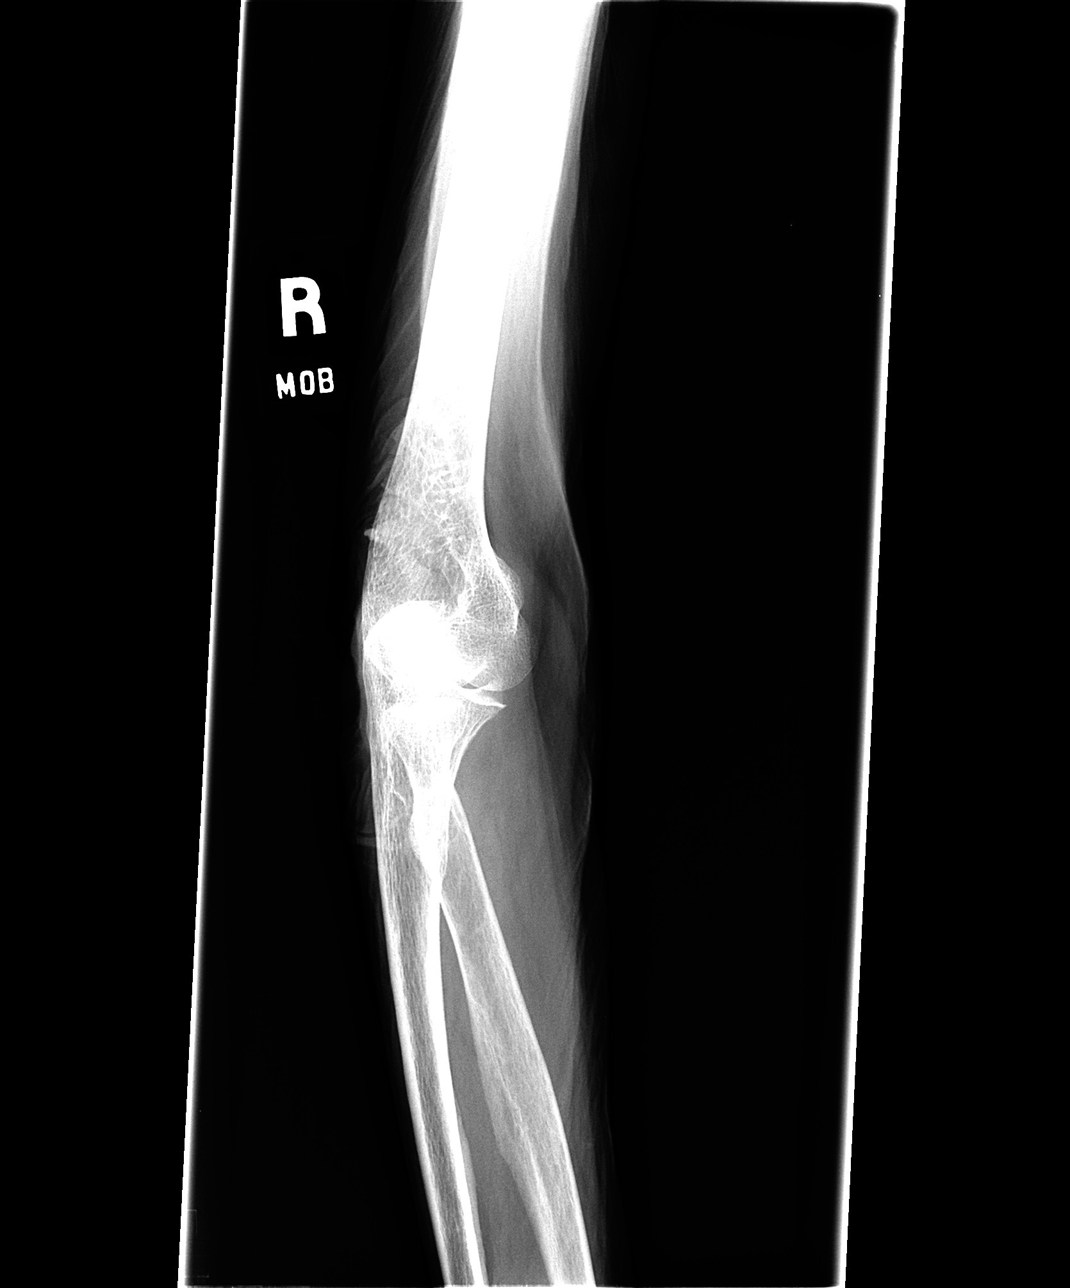

[view not recorded (4 of 4)]
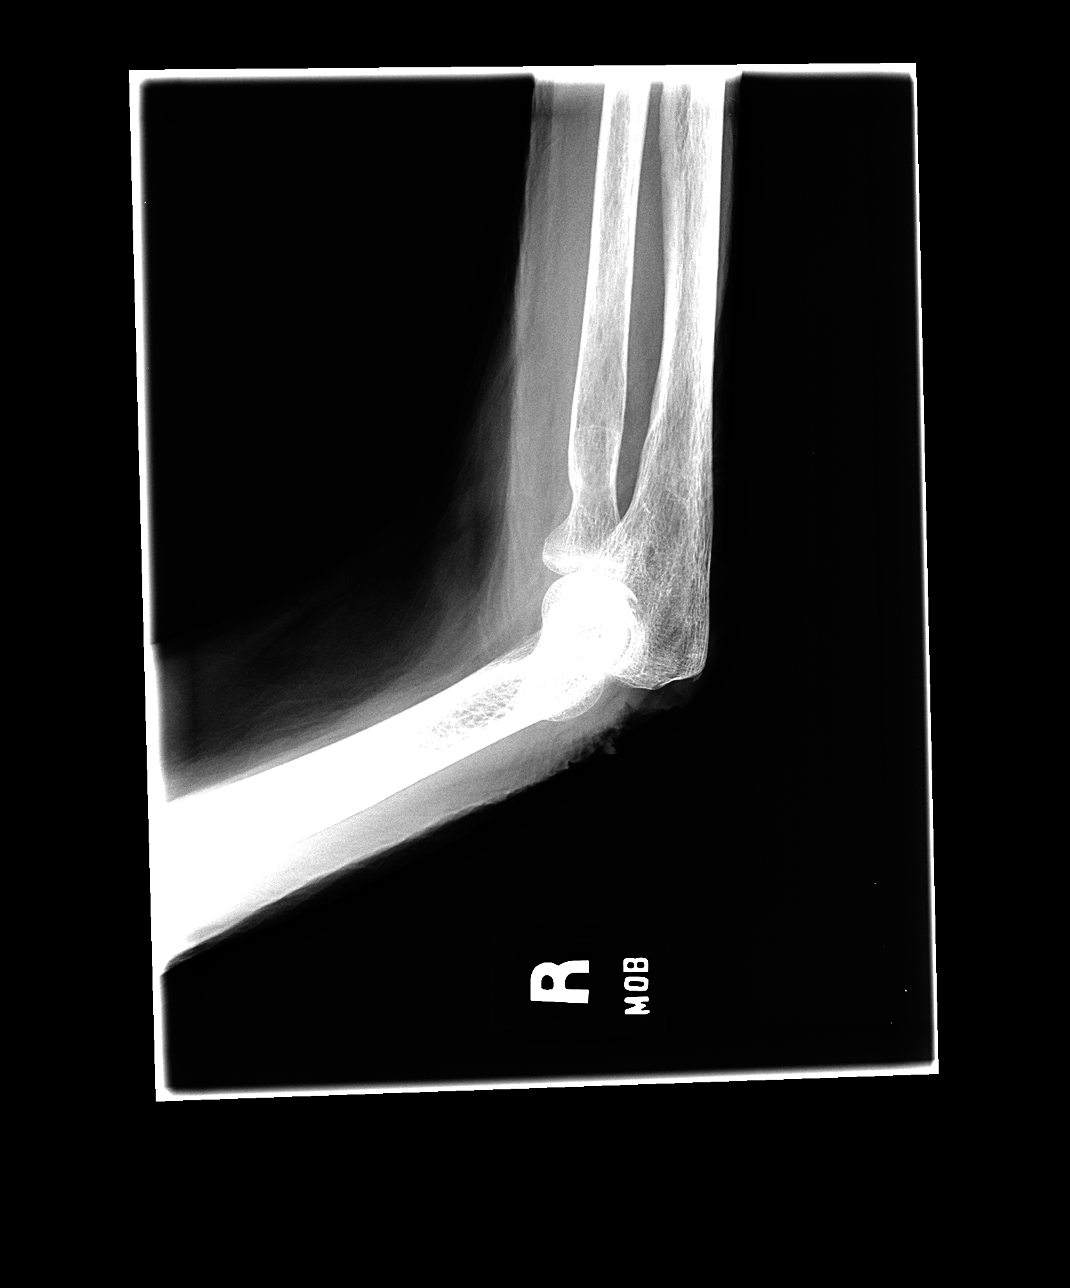

[4 of 4 positions shown; findings below may reference images not displayed]

FINDINGS: There is no evidence of fracture, dislocation, or joint
effusion.  There is no evidence of arthropathy or other focal bone
abnormality.  Soft tissues are unremarkable. Generalized osteopenia
is noted.
IMPRESSION: No acute findings.  Osteopenia.

## 2014-01-08 ENCOUNTER — Ambulatory Visit (INDEPENDENT_AMBULATORY_CARE_PROVIDER_SITE_OTHER): Payer: Medicare Other

## 2014-01-08 ENCOUNTER — Ambulatory Visit (INDEPENDENT_AMBULATORY_CARE_PROVIDER_SITE_OTHER): Payer: Self-pay | Admitting: Orthopedic Surgery

## 2014-01-08 VITALS — BP 150/84 | Ht 64.0 in | Wt 110.0 lb

## 2014-01-08 DIAGNOSIS — S92909A Unspecified fracture of unspecified foot, initial encounter for closed fracture: Secondary | ICD-10-CM

## 2014-01-08 NOTE — Patient Instructions (Signed)
activities as tolerated 

## 2014-01-08 NOTE — Progress Notes (Signed)
Patient ID: Martha Pearson, female   DOB: September 28, 1917, 80 y.o.   MRN: [id number]  Fracture foot  xrays f/u  xrays show fracture healed   Call or return to clinic prn if these symptoms worsen or fail to improve as anticipated.

## 2014-01-12 ENCOUNTER — Emergency Department (HOSPITAL_COMMUNITY): Payer: Medicare Other

## 2014-01-12 ENCOUNTER — Emergency Department (HOSPITAL_COMMUNITY)
Admission: EM | Admit: 2014-01-12 | Discharge: 2014-01-12 | Disposition: A | Discharge status: Home / Self Care | Payer: Medicare Other | Attending: Emergency Medicine | Admitting: Emergency Medicine

## 2014-01-12 ENCOUNTER — Encounter (HOSPITAL_COMMUNITY): Payer: Self-pay | Admitting: Emergency Medicine

## 2014-01-12 DIAGNOSIS — Z79899 Other long term (current) drug therapy: Secondary | ICD-10-CM | POA: Insufficient documentation

## 2014-01-12 DIAGNOSIS — S0990XA Unspecified injury of head, initial encounter: Secondary | ICD-10-CM

## 2014-01-12 DIAGNOSIS — IMO0002 Reserved for concepts with insufficient information to code with codable children: Secondary | ICD-10-CM

## 2014-01-12 DIAGNOSIS — W1809XA Striking against other object with subsequent fall, initial encounter: Secondary | ICD-10-CM | POA: Insufficient documentation

## 2014-01-12 DIAGNOSIS — I5033 Acute on chronic diastolic (congestive) heart failure: Secondary | ICD-10-CM | POA: Insufficient documentation

## 2014-01-12 DIAGNOSIS — N184 Chronic kidney disease, stage 4 (severe): Secondary | ICD-10-CM | POA: Insufficient documentation

## 2014-01-12 DIAGNOSIS — Z853 Personal history of malignant neoplasm of breast: Secondary | ICD-10-CM | POA: Insufficient documentation

## 2014-01-12 DIAGNOSIS — S0180XA Unspecified open wound of other part of head, initial encounter: Secondary | ICD-10-CM | POA: Insufficient documentation

## 2014-01-12 DIAGNOSIS — G309 Alzheimer's disease, unspecified: Secondary | ICD-10-CM | POA: Insufficient documentation

## 2014-01-12 DIAGNOSIS — I517 Cardiomegaly: Secondary | ICD-10-CM | POA: Insufficient documentation

## 2014-01-12 DIAGNOSIS — F0280 Dementia in other diseases classified elsewhere without behavioral disturbance: Secondary | ICD-10-CM | POA: Insufficient documentation

## 2014-01-12 DIAGNOSIS — Z880 Allergy status to penicillin: Secondary | ICD-10-CM | POA: Insufficient documentation

## 2014-01-12 DIAGNOSIS — E039 Hypothyroidism, unspecified: Secondary | ICD-10-CM | POA: Insufficient documentation

## 2014-01-12 DIAGNOSIS — Z8739 Personal history of other diseases of the musculoskeletal system and connective tissue: Secondary | ICD-10-CM | POA: Insufficient documentation

## 2014-01-12 DIAGNOSIS — I129 Hypertensive chronic kidney disease with stage 1 through stage 4 chronic kidney disease, or unspecified chronic kidney disease: Secondary | ICD-10-CM | POA: Insufficient documentation

## 2014-01-12 DIAGNOSIS — Z8781 Personal history of (healed) traumatic fracture: Secondary | ICD-10-CM | POA: Insufficient documentation

## 2014-01-12 DIAGNOSIS — Y9210 Unspecified residential institution as the place of occurrence of the external cause: Secondary | ICD-10-CM | POA: Insufficient documentation

## 2014-01-12 DIAGNOSIS — Y9389 Activity, other specified: Secondary | ICD-10-CM | POA: Insufficient documentation

## 2014-01-12 DIAGNOSIS — W19XXXA Unspecified fall, initial encounter: Secondary | ICD-10-CM

## 2014-01-12 MED ORDER — HYDROGEN PEROXIDE 3 % EX SOLN
CUTANEOUS | Status: AC
  Administered 2014-01-12: 07:00:00
  Filled 2014-01-12: qty 473

## 2014-01-12 MED ORDER — LIDOCAINE HCL (PF) 1 % IJ SOLN
INTRAMUSCULAR | Status: AC
  Administered 2014-01-12: 09:00:00
  Filled 2014-01-12: qty 5

## 2014-01-12 NOTE — ED Notes (Signed)
Per EMS: pt fell at [facility] in bathroom, striking head against rail. Small laceration above left eyebrow and along hairline in center of forehead.

## 2014-01-12 NOTE — Discharge Instructions (Signed)
Head Injury, Adult Followup for suture removal in 5 days. Return to the ED if you develop new or worsening symptoms. You have received a head injury. It does not appear serious at this time. Headaches and vomiting are common following head injury. It should be easy to awaken from sleeping. Sometimes it is necessary for you to stay in the emergency department for a while for observation. Sometimes admission to the hospital may be needed. After injuries such as yours, most problems occur within the first 24 hours, but side effects may occur up to 7 10 days after the injury. It is important for you to carefully monitor your condition and contact your health care provider or seek immediate medical care if there is a change in your condition. WHAT ARE THE TYPES OF HEAD INJURIES? Head injuries can be as minor as a bump. Some head injuries can be more severe. More severe head injuries include:  A jarring injury to the brain (concussion).  A bruise of the brain (contusion). This mean there is bleeding in the brain that can cause swelling.  A cracked skull (skull fracture).  Bleeding in the brain that collects, clots, and forms a bump (hematoma). WHAT CAUSES A HEAD INJURY? A serious head injury is most likely to happen to someone who is in a car wreck and is not wearing a seat belt. Other causes of major head injuries include bicycle or motorcycle accidents, sports injuries, and falls. HOW ARE HEAD INJURIES DIAGNOSED? A complete history of the event leading to the injury and your current symptoms will be helpful in diagnosing head injuries. Many times, pictures of the brain, such as CT or MRI are needed to see the extent of the injury. Often, an overnight hospital stay is necessary for observation.  WHEN SHOULD I SEEK IMMEDIATE MEDICAL CARE?  You should get help right away if:  You have confusion or drowsiness.  You feel sick to your stomach (nauseous) or have continued, forceful vomiting.  You have  dizziness or unsteadiness that is getting worse.  You have severe, continued headaches not relieved by medicine. Only take over-the-counter or prescription medicines for pain, fever, or discomfort as directed by your health care provider.  You do not have normal function of the arms or legs or are unable to walk.  You notice changes in the black spots in the center of the colored part of your eye (pupil).  You have a clear or bloody fluid coming from your nose or ears.  You have a loss of vision. During the next 24 hours after the injury, you must stay with someone who can watch you for the warning signs. This person should contact local emergency services (911 in the [place] if you have seizures, you become unconscious, or you are unable to wake up. HOW CAN I PREVENT A HEAD INJURY IN THE FUTURE? The most important factor for preventing major head injuries is avoiding motor vehicle accidents. To minimize the potential for damage to your head, it is crucial to wear seat belts while riding in motor vehicles. Wearing helmets while bike riding and playing collision sports (like football) is also helpful. Also, avoiding dangerous activities around the house will further help reduce your risk of head injury.  WHEN CAN I RETURN TO NORMAL ACTIVITIES AND ATHLETICS? You should be reevaluated by your health care provider before returning to these activities. If you have any of the following symptoms, you should not return to activities or contact sports until 1 week  after the symptoms have stopped:  Persistent headache.  Dizziness or vertigo.  Poor attention and concentration.  Confusion.  Memory problems.  Nausea or vomiting.  Fatigue or tire easily.  Irritability.  Intolerant of bright lights or loud noises.  Anxiety or depression.  Disturbed sleep. MAKE SURE YOU:   Understand these instructions.  Will watch your condition.  Will get help right away if you are not doing well or get  worse. Document Released: 08/07/2005 Document Revised: 05/28/2013 Document Reviewed: 04/14/2013 [facility] Patient Information 2014 [place].

## 2014-01-12 NOTE — ED Notes (Signed)
RN called report to [place] at [facility] and verified that DNR was not sent to hospital.

## 2014-01-12 NOTE — ED Provider Notes (Signed)
CSN: [id number]     Arrival date & time 01/12/14  0628 History  This chart was scribed for Ezequiel Essex, MD by Roe Coombs, ED Scribe. The patient was seen in room APA18/APA18. Patient's care was started at 7:04 AM.   Chief Complaint  Patient presents with  . Fall  . Head Laceration     The history is provided by a caregiver and the EMS personnel. No language interpreter was used.    Level 5 Caveat - Unable to obtain full history due to patient's dementia. HPI Comments: Martha Pearson is a 80 y.o. female with a history of Alzheimer's disease with dementia who presents to the Emergency Department complaining of an unwitnessed fall about 1 hour ago at her nursing home, [place]. Per EMS, patient fell and struck her left temple against a railing in her room. She arrived in the ED with a laceration above her left eyebrow and along the hairline of her mid forehead. Patient's guardian accompanies her to the ED. She has a medical history of HTN, CHF, renal disease with CKD 4, hypothyroidism, breast cancer, osteoporosis, LVH, and left hip fracture. Tetanus is UTD.   Past Medical History  Diagnosis Date  . Hypertension   . Thyroid disease     hypothyroid  . Cancer     breast  . Hypothyroid   . Alzheimer disease   . Osteoporosis   . Hip fracture   . LVH (left ventricular hypertrophy)   . Acute on chronic diastolic CHF (congestive heart failure) 07/09/2012.  Marland Kitchen CKD (chronic kidney disease) stage 4, GFR 15-29 ml/min 09/23/2011  . Dementia    Past Surgical History  Procedure Laterality Date  . Mastectomy      right   No family history on file. History  Substance Use Topics  . Smoking status: Never Smoker   . Smokeless tobacco: Never Used  . Alcohol Use: No   OB History   Grav Para Term Preterm Abortions TAB SAB Ect Mult Living                 Review of Systems Level 5 Caveat - Unable to complete full ROS due to patient's dementia.  Allergies  Ace inhibitors; Penicillins;  and Sulfa antibiotics  Home Medications   Prior to Admission medications   Medication Sig Start Date End Date Taking? Authorizing Provider  acetaminophen (TYLENOL) 325 MG tablet Take 2 tablets (650 mg total) by mouth every 6 (six) hours as needed for fever. 07/13/12  Yes Rexene Alberts, MD  amLODipine (NORVASC) 10 MG tablet Take 10 mg by mouth daily.   Yes Historical Provider, MD  carvedilol (COREG) 6.25 MG tablet Take 1 tablet (6.25 mg total) by mouth 2 (two) times daily with a meal. 07/13/12  Yes Rexene Alberts, MD  divalproex (DEPAKOTE) 125 MG DR tablet Take 125 mg by mouth at bedtime.   Yes Historical Provider, MD  furosemide (LASIX) 20 MG tablet Take 20 mg by mouth daily as needed for fluid. Weight gain of 3 pounds in 24 hours   Yes Historical Provider, MD  guaiFENesin-dextromethorphan (ROBITUSSIN DM) 100-10 MG/5ML syrup Take 5 mLs by mouth 3 (three) times daily as needed for cough.   Yes Historical Provider, MD  levothyroxine (SYNTHROID, LEVOTHROID) 88 MCG tablet Take 88 mcg by mouth daily.   Yes Historical Provider, MD  loperamide (IMODIUM) 2 MG capsule Take 2 mg by mouth 4 (four) times daily as needed for diarrhea or loose stools.   Yes Historical Provider,  MD  LORazepam (ATIVAN) 0.5 MG tablet Take 0.25 mg by mouth every 4 (four) hours as needed for anxiety.    Yes Historical Provider, MD  omeprazole (PRILOSEC) 20 MG capsule Take 20 mg by mouth daily.   Yes Historical Provider, MD  polyethylene glycol (MIRALAX / GLYCOLAX) packet Take 17 g by mouth daily. constipation   Yes Historical Provider, MD  promethazine (PHENERGAN) 25 MG tablet Take 25 mg by mouth every 6 (six) hours as needed for nausea.   Yes Historical Provider, MD  sertraline (ZOLOFT) 25 MG tablet Take 25 mg by mouth daily.     Yes Historical Provider, MD  zinc oxide 20 % ointment Apply 1 application topically 3 (three) times daily as needed. *One application applied to affected area(s) of buttocks three times daily as needed for  redness   Yes Historical Provider, MD  cholecalciferol (VITAMIN D) 1000 UNITS tablet Take 1,000 Units by mouth 2 (two) times daily.    Historical Provider, MD  ENSURE (ENSURE) Take 237 mLs by mouth 2 (two) times daily.     Historical Provider, MD   Triage Vitals: BP 172/69  Pulse 79  Temp(Src) 98 F (36.7 C)  Resp 18  SpO2 95% Physical Exam  Nursing note and vitals reviewed. Constitutional: She appears well-developed and well-nourished. No distress.  Moans, does not follow commands  HENT:  Head: Normocephalic. Head is with abrasion and with laceration.  Mouth/Throat: Oropharynx is clear and moist. No oropharyngeal exudate.  2 cm vertical laceration to hairline to L scalp. 2 1 cm Superficial lacerations to left eyebrow.  Eyes: Conjunctivae and EOM are normal. Pupils are equal, round, and reactive to light.  Neck: Normal range of motion. Neck supple.  Cardiovascular: Normal rate, regular rhythm and normal heart sounds.   No murmur heard. Pulmonary/Chest: Effort normal and breath sounds normal. No respiratory distress.  Abdominal: Soft. There is no tenderness. There is no rebound and no guarding.  Musculoskeletal: She exhibits no edema and no tenderness.  No CTLS spine tenderness. Upper extremity contractures. Full ROM of hips without pain.  Neurological: She is alert.  Moans, does not follow commands, moving all extremities.  Skin: Skin is warm and dry.  Psychiatric: She has a normal mood and affect. Her behavior is normal.    ED Course  LACERATION REPAIR Date/Time: 01/12/2014 8:33 AM Performed by: Ezequiel Essex Authorized by: Ezequiel Essex Consent: Verbal consent obtained. Risks and benefits: risks, benefits and alternatives were discussed Consent given by: patient and guardian Patient understanding: patient states understanding of the procedure being performed Patient consent: the patient's understanding of the procedure matches consent given Procedure consent:  procedure consent matches procedure scheduled Required items: required blood products, implants, devices, and special equipment available Patient identity confirmed: verbally with patient and provided demographic data Time out: Immediately prior to procedure a "time out" was called to verify the correct patient, procedure, equipment, support staff and site/side marked as required. Body area: head/neck Location details: forehead Laceration length: 2 cm Tendon involvement: none Nerve involvement: none Vascular damage: no Anesthesia: local infiltration Local anesthetic: lidocaine 1% without epinephrine Anesthetic total: 3 ml Patient sedated: no Preparation: Patient was prepped and draped in the usual sterile fashion. Irrigation solution: saline Irrigation method: syringe Amount of cleaning: standard Debridement: none Degree of undermining: none Wound skin closure material used: 5-0 vicryl. Number of sutures: 4 Technique: simple Approximation: close Approximation difficulty: complex Dressing: antibiotic ointment and 4x4 sterile gauze Patient tolerance: Patient tolerated the procedure well with  no immediate complications.  LACERATION REPAIR Date/Time: 01/12/2014 8:34 AM Performed by: Ezequiel Essex Authorized by: Ezequiel Essex Consent: Verbal consent obtained. Risks and benefits: risks, benefits and alternatives were discussed Consent given by: guardian and patient Patient understanding: patient states understanding of the procedure being performed Patient consent: the patient's understanding of the procedure matches consent given Procedure consent: procedure consent matches procedure scheduled Required items: required blood products, implants, devices, and special equipment available Patient identity confirmed: verbally with patient and provided demographic data Body area: head/neck Location details: forehead Laceration length: 2 cm Tendon involvement: none Nerve  involvement: none Vascular damage: no Anesthesia: local infiltration Local anesthetic: lidocaine 1% without epinephrine Anesthetic total: 3 ml Patient sedated: no Preparation: Patient was prepped and draped in the usual sterile fashion. Irrigation solution: tap water and saline Irrigation method: syringe Amount of cleaning: standard Debridement: none Degree of undermining: none Skin closure: staples Number of sutures: 3 Technique: simple Approximation: loose Approximation difficulty: simple Dressing: 4x4 sterile gauze Patient tolerance: Patient tolerated the procedure well with no immediate complications.   (including critical care time) DIAGNOSTIC STUDIES: Oxygen Saturation is 95% on room air, adequate by my interpretation.    COORDINATION OF CARE: 7:08 AM- Patient informed of current plan for treatment and evaluation and agrees with plan at this time.     Labs Review Labs Reviewed - No data to display  Imaging Review Dg Chest 1 View  01/12/2014   CLINICAL DATA:  Head trauma secondary to a fall. Altered mental status.  EXAM: CHEST - 1 VIEW  COMPARISON:  09/26/2012  FINDINGS: Heart size and pulmonary vascularity are normal. No acute infiltrates or effusions. The interstitial markings and vascularity are accentuated due to the shallow inspiration.  There is osteopenia. There is an old deformity of the proximal left humerus. No acute osseous abnormality.  IMPRESSION: No acute disease.   Electronically Signed   By: Rozetta Nunnery M.D.   On: 01/12/2014 07:39   Dg Pelvis 1-2 Views  01/12/2014   CLINICAL DATA:  Head trauma secondary to a fall.  EXAM: PELVIS - 1-2 VIEW  COMPARISON:  Radiograph dated 07/26/2013  FINDINGS: There is no acute pelvic fracture. There are old healed fractures of the left side of the pelvis. There are healed bilateral proximal femur fractures with compression screws and side plates in both proximal femurs.  Bowel gas pattern is normal.  IMPRESSION: No acute  osseous abnormality of the pelvis.   Electronically Signed   By: Rozetta Nunnery M.D.   On: 01/12/2014 07:41   Ct Head Wo Contrast  01/12/2014   CLINICAL DATA:  Head trauma secondary to a fall.  Scalp lacerations.  EXAM: CT HEAD WITHOUT CONTRAST  CT CERVICAL SPINE WITHOUT CONTRAST  TECHNIQUE: Multidetector CT imaging of the head and cervical spine was performed following the standard protocol without intravenous contrast. Multiplanar CT image reconstructions of the cervical spine were also generated.  COMPARISON:  None.  CT scan dated 10/27/2013  FINDINGS: CT HEAD FINDINGS  No mass lesion. No midline shift. No acute intracranial hemorrhage or intracranial hematoma. No extra-axial fluid collections. No evidence of acute infarction. There is diffuse severe cerebral cortical and cerebellar atrophy with secondary ventricular dilatation. The atrophy is most severe in the temporal lobes. There are old bilateral lacunar infarcts, stable.  There are small areas of contusion and laceration of the scalp over the left superior orbital rim and in the midline of the forehead.  CT CERVICAL SPINE FINDINGS  There is no  acute fracture or subluxation or prevertebral soft tissue swelling. The patient has solid surgical posterior fusion at C1-2. There is degenerative disc disease from C2-3 through C5-6 with slight retrolisthesis of C5 on C6 with narrowing in the AP dimension of the spinal canal at that level, unchanged.  There is a slight anterolisthesis of C7 on T1, unchanged.  IMPRESSION: Scalp contusions and lacerations as described. No acute intracranial abnormality.  No acute abnormality of the cervical spine.   Electronically Signed   By: Rozetta Nunnery M.D.   On: 01/12/2014 07:45   Ct Cervical Spine Wo Contrast  01/12/2014   CLINICAL DATA:  Head trauma secondary to a fall.  Scalp lacerations.  EXAM: CT HEAD WITHOUT CONTRAST  CT CERVICAL SPINE WITHOUT CONTRAST  TECHNIQUE: Multidetector CT imaging of the head and cervical spine  was performed following the standard protocol without intravenous contrast. Multiplanar CT image reconstructions of the cervical spine were also generated.  COMPARISON:  None.  CT scan dated 10/27/2013  FINDINGS: CT HEAD FINDINGS  No mass lesion. No midline shift. No acute intracranial hemorrhage or intracranial hematoma. No extra-axial fluid collections. No evidence of acute infarction. There is diffuse severe cerebral cortical and cerebellar atrophy with secondary ventricular dilatation. The atrophy is most severe in the temporal lobes. There are old bilateral lacunar infarcts, stable.  There are small areas of contusion and laceration of the scalp over the left superior orbital rim and in the midline of the forehead.  CT CERVICAL SPINE FINDINGS  There is no acute fracture or subluxation or prevertebral soft tissue swelling. The patient has solid surgical posterior fusion at C1-2. There is degenerative disc disease from C2-3 through C5-6 with slight retrolisthesis of C5 on C6 with narrowing in the AP dimension of the spinal canal at that level, unchanged.  There is a slight anterolisthesis of C7 on T1, unchanged.  IMPRESSION: Scalp contusions and lacerations as described. No acute intracranial abnormality.  No acute abnormality of the cervical spine.   Electronically Signed   By: Rozetta Nunnery M.D.   On: 01/12/2014 07:45     EKG Interpretation None      MDM   Final diagnoses:  None   Fall at nursing home with head injury. Unknown LOC.  Not on anticoagulants. No C spine pain.  At baseline mental status per family member.  Imaging is negative for acute traumatic injury. Laceration was repaired as above. Tetanus is up-to-date. Patient will need suture removal in 5-7 days. Is at her baseline mental status per her caregiver. She is wheelchair bound at baseline. Return precautions discussed.  BP 158/59  Pulse 75  Temp(Src) 98 F (36.7 C)  Resp 18  SpO2 96%  I personally performed the services  described in this documentation, which was scribed in my presence. The recorded information has been reviewed and is accurate.    Ezequiel Essex, MD 01/12/14 [phone]

## 2014-02-23 ENCOUNTER — Inpatient Hospital Stay (HOSPITAL_COMMUNITY)
Admission: EM | Admit: 2014-02-23 | Discharge: 2014-02-27 | DRG: 871 | Disposition: A | Discharge status: Nursing Home (Includes ICF) | Payer: Medicare Other | Attending: Family Medicine | Admitting: Family Medicine

## 2014-02-23 ENCOUNTER — Emergency Department (HOSPITAL_COMMUNITY): Payer: Medicare Other

## 2014-02-23 ENCOUNTER — Encounter (HOSPITAL_COMMUNITY): Payer: Self-pay | Admitting: Emergency Medicine

## 2014-02-23 DIAGNOSIS — G92 Toxic encephalopathy: Secondary | ICD-10-CM | POA: Diagnosis present

## 2014-02-23 DIAGNOSIS — R0902 Hypoxemia: Secondary | ICD-10-CM | POA: Diagnosis present

## 2014-02-23 DIAGNOSIS — N12 Tubulo-interstitial nephritis, not specified as acute or chronic: Secondary | ICD-10-CM | POA: Diagnosis present

## 2014-02-23 DIAGNOSIS — K449 Diaphragmatic hernia without obstruction or gangrene: Secondary | ICD-10-CM | POA: Diagnosis present

## 2014-02-23 DIAGNOSIS — F0390 Unspecified dementia without behavioral disturbance: Secondary | ICD-10-CM

## 2014-02-23 DIAGNOSIS — R195 Other fecal abnormalities: Secondary | ICD-10-CM

## 2014-02-23 DIAGNOSIS — K269 Duodenal ulcer, unspecified as acute or chronic, without hemorrhage or perforation: Secondary | ICD-10-CM | POA: Diagnosis present

## 2014-02-23 DIAGNOSIS — I129 Hypertensive chronic kidney disease with stage 1 through stage 4 chronic kidney disease, or unspecified chronic kidney disease: Secondary | ICD-10-CM | POA: Diagnosis present

## 2014-02-23 DIAGNOSIS — K5900 Constipation, unspecified: Secondary | ICD-10-CM | POA: Diagnosis present

## 2014-02-23 DIAGNOSIS — K222 Esophageal obstruction: Secondary | ICD-10-CM | POA: Diagnosis present

## 2014-02-23 DIAGNOSIS — E039 Hypothyroidism, unspecified: Secondary | ICD-10-CM | POA: Diagnosis present

## 2014-02-23 DIAGNOSIS — D649 Anemia, unspecified: Secondary | ICD-10-CM | POA: Diagnosis present

## 2014-02-23 DIAGNOSIS — A419 Sepsis, unspecified organism: Principal | ICD-10-CM

## 2014-02-23 DIAGNOSIS — Z882 Allergy status to sulfonamides status: Secondary | ICD-10-CM

## 2014-02-23 DIAGNOSIS — G929 Unspecified toxic encephalopathy: Secondary | ICD-10-CM | POA: Diagnosis present

## 2014-02-23 DIAGNOSIS — D500 Iron deficiency anemia secondary to blood loss (chronic): Secondary | ICD-10-CM

## 2014-02-23 DIAGNOSIS — K2980 Duodenitis without bleeding: Secondary | ICD-10-CM | POA: Diagnosis present

## 2014-02-23 DIAGNOSIS — G309 Alzheimer's disease, unspecified: Secondary | ICD-10-CM | POA: Diagnosis present

## 2014-02-23 DIAGNOSIS — Z91199 Patient's noncompliance with other medical treatment and regimen due to unspecified reason: Secondary | ICD-10-CM

## 2014-02-23 DIAGNOSIS — G9340 Encephalopathy, unspecified: Secondary | ICD-10-CM

## 2014-02-23 DIAGNOSIS — N390 Urinary tract infection, site not specified: Secondary | ICD-10-CM

## 2014-02-23 DIAGNOSIS — Z66 Do not resuscitate: Secondary | ICD-10-CM | POA: Diagnosis present

## 2014-02-23 DIAGNOSIS — I5033 Acute on chronic diastolic (congestive) heart failure: Secondary | ICD-10-CM

## 2014-02-23 DIAGNOSIS — D62 Acute posthemorrhagic anemia: Secondary | ICD-10-CM | POA: Diagnosis present

## 2014-02-23 DIAGNOSIS — Z7401 Bed confinement status: Secondary | ICD-10-CM

## 2014-02-23 DIAGNOSIS — N184 Chronic kidney disease, stage 4 (severe): Secondary | ICD-10-CM

## 2014-02-23 DIAGNOSIS — F0280 Dementia in other diseases classified elsewhere without behavioral disturbance: Secondary | ICD-10-CM | POA: Diagnosis present

## 2014-02-23 DIAGNOSIS — M810 Age-related osteoporosis without current pathological fracture: Secondary | ICD-10-CM | POA: Diagnosis present

## 2014-02-23 DIAGNOSIS — D131 Benign neoplasm of stomach: Secondary | ICD-10-CM | POA: Diagnosis present

## 2014-02-23 DIAGNOSIS — Z9119 Patient's noncompliance with other medical treatment and regimen: Secondary | ICD-10-CM

## 2014-02-23 DIAGNOSIS — Z79899 Other long term (current) drug therapy: Secondary | ICD-10-CM

## 2014-02-23 DIAGNOSIS — I509 Heart failure, unspecified: Secondary | ICD-10-CM

## 2014-02-23 LAB — CBC WITH DIFFERENTIAL/PLATELET
BASOS PCT: 0 % (ref 0–1)
Basophils Absolute: 0 10*3/uL (ref 0.0–0.1)
EOS ABS: 0.3 10*3/uL (ref 0.0–0.7)
Eosinophils Relative: 6 % — ABNORMAL HIGH (ref 0–5)
HEMATOCRIT: 18 % — AB (ref 36.0–46.0)
HEMOGLOBIN: 5.1 g/dL — AB (ref 12.0–15.0)
LYMPHS PCT: 14 % (ref 12–46)
Lymphs Abs: 0.7 10*3/uL (ref 0.7–4.0)
MCH: 17.5 pg — ABNORMAL LOW (ref 26.0–34.0)
MCHC: 28.3 g/dL — ABNORMAL LOW (ref 30.0–36.0)
MCV: 61.9 fL — ABNORMAL LOW (ref 78.0–100.0)
Monocytes Absolute: 0.5 10*3/uL (ref 0.1–1.0)
Monocytes Relative: 10 % (ref 3–12)
NEUTROS ABS: 3.3 10*3/uL (ref 1.7–7.7)
Neutrophils Relative %: 70 % (ref 43–77)
Platelets: 279 10*3/uL (ref 150–400)
RBC: 2.91 MIL/uL — ABNORMAL LOW (ref 3.87–5.11)
RDW: 19.4 % — ABNORMAL HIGH (ref 11.5–15.5)
WBC: 4.8 10*3/uL (ref 4.0–10.5)

## 2014-02-23 LAB — PREPARE RBC (CROSSMATCH)

## 2014-02-23 LAB — URINE MICROSCOPIC-ADD ON

## 2014-02-23 LAB — BASIC METABOLIC PANEL
Anion gap: 13 (ref 5–15)
BUN: 75 mg/dL — AB (ref 6–23)
CO2: 22 meq/L (ref 19–32)
Calcium: 9 mg/dL (ref 8.4–10.5)
Chloride: 105 mEq/L (ref 96–112)
Creatinine, Ser: 2.49 mg/dL — ABNORMAL HIGH (ref 0.50–1.10)
GFR calc Af Amer: 18 mL/min — ABNORMAL LOW (ref >90)
GFR, EST NON AFRICAN AMERICAN: 16 mL/min — AB (ref >90)
GLUCOSE: 96 mg/dL (ref 70–99)
POTASSIUM: 4.9 meq/L (ref 3.7–5.3)
Sodium: 140 mEq/L (ref 137–147)

## 2014-02-23 LAB — URINALYSIS, ROUTINE W REFLEX MICROSCOPIC
BILIRUBIN URINE: NEGATIVE
Glucose, UA: NEGATIVE mg/dL
Hgb urine dipstick: NEGATIVE
KETONES UR: NEGATIVE mg/dL
LEUKOCYTES UA: NEGATIVE
NITRITE: POSITIVE — AB
Specific Gravity, Urine: 1.01 (ref 1.005–1.030)
Urobilinogen, UA: 0.2 mg/dL (ref 0.0–1.0)
pH: 6 (ref 5.0–8.0)

## 2014-02-23 LAB — PRO B NATRIURETIC PEPTIDE: Pro B Natriuretic peptide (BNP): 6266 pg/mL — ABNORMAL HIGH (ref 0–450)

## 2014-02-23 MED ORDER — LEVOFLOXACIN IN D5W 750 MG/150ML IV SOLN
750.0000 mg | Freq: Once | INTRAVENOUS | Status: AC
  Administered 2014-02-24: 750 mg via INTRAVENOUS
  Filled 2014-02-23: qty 150

## 2014-02-23 MED ORDER — LEVOTHYROXINE SODIUM 88 MCG PO TABS
88.0000 ug | ORAL_TABLET | Freq: Every day | ORAL | Status: DC
  Administered 2014-02-26 – 2014-02-27 (×2): 88 ug via ORAL
  Filled 2014-02-23 (×3): qty 1

## 2014-02-23 MED ORDER — LEVOFLOXACIN IN D5W 500 MG/100ML IV SOLN
500.0000 mg | INTRAVENOUS | Status: DC
  Administered 2014-02-25: 500 mg via INTRAVENOUS
  Filled 2014-02-23: qty 100

## 2014-02-23 MED ORDER — LORAZEPAM 0.5 MG PO TABS: 0.2500 mg | ORAL_TABLET | ORAL | Status: DC | PRN

## 2014-02-23 MED ORDER — ACETAMINOPHEN 325 MG PO TABS: 650.0000 mg | ORAL_TABLET | Freq: Four times a day (QID) | ORAL | Status: DC | PRN

## 2014-02-23 MED ORDER — FUROSEMIDE 10 MG/ML IJ SOLN
20.0000 mg | Freq: Once | INTRAMUSCULAR | Status: AC
  Administered 2014-02-24: 20 mg via INTRAVENOUS
  Filled 2014-02-23: qty 2

## 2014-02-23 MED ORDER — CARVEDILOL 3.125 MG PO TABS
6.2500 mg | ORAL_TABLET | Freq: Two times a day (BID) | ORAL | Status: DC
  Administered 2014-02-25 – 2014-02-27 (×3): 6.25 mg via ORAL
  Filled 2014-02-23 (×4): qty 2

## 2014-02-23 MED ORDER — SODIUM CHLORIDE 0.9 % IJ SOLN
3.0000 mL | Freq: Two times a day (BID) | INTRAMUSCULAR | Status: DC
  Administered 2014-02-27: 3 mL via INTRAVENOUS

## 2014-02-23 MED ORDER — SODIUM CHLORIDE 0.9 % IJ SOLN
3.0000 mL | Freq: Two times a day (BID) | INTRAMUSCULAR | Status: DC
  Administered 2014-02-24 – 2014-02-25 (×3): 3 mL via INTRAVENOUS

## 2014-02-23 MED ORDER — SODIUM CHLORIDE 0.9 % IJ SOLN: 3.0000 mL | INTRAMUSCULAR | Status: DC | PRN

## 2014-02-23 MED ORDER — POLYETHYLENE GLYCOL 3350 17 G PO PACK
17.0000 g | PACK | Freq: Every day | ORAL | Status: DC
  Filled 2014-02-23: qty 1

## 2014-02-23 MED ORDER — SODIUM CHLORIDE 0.9 % IV SOLN: 250.0000 mL | INTRAVENOUS | Status: DC | PRN

## 2014-02-23 MED ORDER — LEVOFLOXACIN IN D5W 750 MG/150ML IV SOLN: 750.0000 mg | Freq: Once | INTRAVENOUS | Status: DC

## 2014-02-23 MED ORDER — SERTRALINE HCL 50 MG PO TABS
25.0000 mg | ORAL_TABLET | Freq: Every day | ORAL | Status: DC
  Administered 2014-02-25 – 2014-02-27 (×3): 25 mg via ORAL
  Filled 2014-02-23 (×4): qty 1

## 2014-02-23 MED ORDER — DIVALPROEX SODIUM 125 MG PO DR TAB
125.0000 mg | DELAYED_RELEASE_TABLET | Freq: Every day | ORAL | Status: DC
  Administered 2014-02-25: 125 mg via ORAL
  Filled 2014-02-23 (×7): qty 1

## 2014-02-23 MED ORDER — FUROSEMIDE 10 MG/ML IJ SOLN
40.0000 mg | Freq: Once | INTRAMUSCULAR | Status: AC
  Administered 2014-02-23: 40 mg via INTRAVENOUS
  Filled 2014-02-23: qty 4

## 2014-02-23 MED ORDER — SODIUM CHLORIDE 0.9 % IV SOLN
Freq: Once | INTRAVENOUS | Status: AC
  Administered 2014-02-23: 18:00:00 via INTRAVENOUS

## 2014-02-23 NOTE — ED Notes (Signed)
Lab called with critical hemoglobin result of 5.1. Dr Roderic Palau has been notified. Family is now at bedside and has been updated on plan of care. Bare hugger applied to help increase body temp.

## 2014-02-23 NOTE — ED Notes (Signed)
Patients oxygen saturation maintained in low 80%. Patient placed on NRB at this time. O2 saturations increased to 100%

## 2014-02-23 NOTE — ED Notes (Signed)
Pt O2 sat in low-to-mid 80s. Pt refuses nasal canula and pull off the canula when staff applies it to her nose. MD notified.

## 2014-02-23 NOTE — ED Provider Notes (Signed)
CSN: [id number]     Arrival date & time 02/23/14  1612 History  This chart was scribed for Maudry Diego, MD by Ladene Artist, ED Scribe. The patient was seen in room APA05/APA05. Patient's care was started at 4:30 PM.   Chief Complaint  Patient presents with  . Weakness   Patient is a 80 y.o. female presenting with weakness. The history is provided by the EMS personnel. No language interpreter was used.  Weakness This is a new problem. The current episode started yesterday. Pertinent negatives include no chest pain, no abdominal pain and no headaches.  LEVEL 5 CAVEAT due to Dementia  HPI Comments: Martha Pearson is a 80 y.o. female, with a h/o Alzheimer's disease with dementia, who presents to the Emergency Department complaining of weakness. Pt is from [facility] home and was brought in by EMS. Per EMS, pt was brought in for a low hemoglobin result or 5.6 following a blood draw done yesterday. Staff at nursing home also report that pt seems "weaker than usual".  Past Medical History  Diagnosis Date  . Hypertension   . Thyroid disease     hypothyroid  . Cancer     breast  . Hypothyroid   . Alzheimer disease   . Osteoporosis   . Hip fracture   . LVH (left ventricular hypertrophy)   . Acute on chronic diastolic CHF (congestive heart failure) 07/09/2012.  Marland Kitchen CKD (chronic kidney disease) stage 4, GFR 15-29 ml/min 09/23/2011  . Dementia    Past Surgical History  Procedure Laterality Date  . Mastectomy      right   History reviewed. No pertinent family history. History  Substance Use Topics  . Smoking status: Never Smoker   . Smokeless tobacco: Never Used  . Alcohol Use: No   OB History   Grav Para Term Preterm Abortions TAB SAB Ect Mult Living                 Review of Systems  Unable to perform ROS: Dementia  Constitutional: Negative for appetite change and fatigue.  HENT: Negative for congestion, ear discharge and sinus pressure.   Eyes: Negative for  discharge.  Respiratory: Negative for cough.   Cardiovascular: Negative for chest pain.  Gastrointestinal: Negative for abdominal pain and diarrhea.  Genitourinary: Negative for frequency and hematuria.  Musculoskeletal: Negative for back pain.  Skin: Negative for rash.  Neurological: Positive for weakness. Negative for seizures and headaches.  Psychiatric/Behavioral: Negative for hallucinations.   Allergies  Ace inhibitors; Penicillins; and Sulfa antibiotics  Home Medications   Prior to Admission medications   Medication Sig Start Date End Date Taking? Authorizing Provider  amLODipine (NORVASC) 10 MG tablet Take 10 mg by mouth daily.   Yes Historical Provider, MD  carvedilol (COREG) 6.25 MG tablet Take 1 tablet (6.25 mg total) by mouth 2 (two) times daily with a meal. 07/13/12  Yes Rexene Alberts, MD  cetirizine (QC ALL DAY ALLERGY) 10 MG tablet Take 5 mg by mouth every 6 (six) hours as needed (for itching).   Yes Historical Provider, MD  cholecalciferol (VITAMIN D) 1000 UNITS tablet Take 1,000 Units by mouth 2 (two) times daily.   Yes Historical Provider, MD  divalproex (DEPAKOTE) 125 MG DR tablet Take 125 mg by mouth at bedtime.   Yes Historical Provider, MD  ENSURE (ENSURE) Take 237 mLs by mouth 2 (two) times daily.    Yes Historical Provider, MD  furosemide (LASIX) 20 MG tablet Take 20 mg  by mouth daily. Weight gain of 3 pounds in 24 hours   Yes Historical Provider, MD  levothyroxine (SYNTHROID, LEVOTHROID) 88 MCG tablet Take 88 mcg by mouth daily.   Yes Historical Provider, MD  polyethylene glycol (MIRALAX / GLYCOLAX) packet Take 17 g by mouth daily. constipation   Yes Historical Provider, MD  sertraline (ZOLOFT) 25 MG tablet Take 25 mg by mouth daily.     Yes Historical Provider, MD  acetaminophen (TYLENOL) 325 MG tablet Take 2 tablets (650 mg total) by mouth every 6 (six) hours as needed for fever. 07/13/12   Rexene Alberts, MD  guaiFENesin-dextromethorphan (ROBITUSSIN DM) 100-10  MG/5ML syrup Take 5 mLs by mouth 3 (three) times daily as needed for cough.    Historical Provider, MD  loperamide (IMODIUM) 2 MG capsule Take 2 mg by mouth 4 (four) times daily as needed for diarrhea or loose stools.    Historical Provider, MD  LORazepam (ATIVAN) 0.5 MG tablet Take 0.25 mg by mouth every 4 (four) hours as needed for anxiety.     Historical Provider, MD  promethazine (PHENERGAN) 25 MG tablet Take 25 mg by mouth every 6 (six) hours as needed for nausea.    Historical Provider, MD  zinc oxide 20 % ointment Apply 1 application topically 3 (three) times daily as needed. *One application applied to affected area(s) of buttocks three times daily as needed for redness    Historical Provider, MD   Triage Vitals: BP 114/60  Pulse 52  Temp(Src) 93.4 F (34.1 C) (Rectal)  Resp 16  SpO2 78% Physical Exam  Nursing note and vitals reviewed. Constitutional: She appears cachectic.  HENT:  Head: Normocephalic.  Mouth/Throat: Mucous membranes are dry.  Eyes: Conjunctivae and EOM are normal. No scleral icterus.  Neck: Neck supple. No thyromegaly present.  Cardiovascular: Normal rate and regular rhythm.  Exam reveals no gallop and no friction rub.   No murmur heard. Pulmonary/Chest: No stridor. She has no wheezes. She has no rales. She exhibits no tenderness.  R mastectomy  Abdominal: She exhibits no distension. There is no tenderness. There is no rebound.  Musculoskeletal: Normal range of motion. She exhibits tenderness. She exhibits no edema.  Tender to any place you touch Contraction in all 4 extremities Unna boots on both ankles   Lymphadenopathy:    She has no cervical adenopathy.  Neurological: She exhibits normal muscle tone. Coordination normal.  Not oriented to anything  Skin: No rash noted. No erythema.  Psychiatric: She has a normal mood and affect. Her behavior is normal.   ED Course  Procedures (including critical care time) DIAGNOSTIC STUDIES: Oxygen Saturation is  78% on RA, low by my interpretation.    COORDINATION OF CARE: 4:36 PM-Discussed treatment plan which includes diagnostic lab work with pt at bedside and pt agreed to plan.   Labs Review Labs Reviewed  CBC WITH DIFFERENTIAL - Abnormal; Notable for the following:    RBC 2.91 (*)    Hemoglobin 5.1 (*)    HCT 18.0 (*)    MCV 61.9 (*)    MCH 17.5 (*)    MCHC 28.3 (*)    RDW 19.4 (*)    Eosinophils Relative 6 (*)    All other components within normal limits  BASIC METABOLIC PANEL - Abnormal; Notable for the following:    BUN 75 (*)    Creatinine, Ser 2.49 (*)    GFR calc non Af Amer 16 (*)    GFR calc Af Amer 18 (*)  All other components within normal limits  PRO B NATRIURETIC PEPTIDE - Abnormal; Notable for the following:    Pro B Natriuretic peptide (BNP) 6266.0 (*)    All other components within normal limits  URINALYSIS, ROUTINE W REFLEX MICROSCOPIC  TYPE AND SCREEN  PREPARE RBC (CROSSMATCH)   Imaging Review Dg Chest Portable 1 View  02/23/2014   CLINICAL DATA:  Weakness, with history of hypertension.  EXAM: PORTABLE CHEST - 1 VIEW  COMPARISON:  01/12/2014.  FINDINGS: Cardiomegaly. Calcified tortuous aorta. Bilateral airspace opacities with effusions, probable pulmonary edema. Baseline chronic lung disease with interstitial prominence. Chronic deformity left humeral neck. Osteopenia. Worsening aeration from priors.  IMPRESSION: Cardiomegaly with bilateral airspace opacities and effusions, likely CHF.   Electronically Signed   By: Rolla Flatten M.D.   On: 02/23/2014 19:18    EKG Interpretation   Date/Time:  Monday February 23 2014 16:55:17 EDT Ventricular Rate:  65 PR Interval:  190 QRS Duration: 126 QT Interval:  495 QTC Calculation: 515 R Axis:   65 Text Interpretation:  Wandering atrial pacemaker Probable left atrial  enlargement Right bundle branch block Artifact in lead(s) I II III aVR aVL  aVF V1 V2 V3 and baseline wander in lead(s) V3 V5 Confirmed by Labella Zahradnik  MD,  Tyee Vandevoorde  [phone]) on 02/23/2014 8:05:46 PM     CRITICAL CARE Performed by: Omni Dunsworth L Total critical care time: 40 Critical care time was exclusive of separately billable procedures and treating other patients. Critical care was necessary to treat or prevent imminent or life-threatening deterioration. Critical care was time spent personally by me on the following activities: development of treatment plan with patient and/or surrogate as well as nursing, discussions with consultants, evaluation of patient's response to treatment, examination of patient, obtaining history from patient or surrogate, ordering and performing treatments and interventions, ordering and review of laboratory studies, ordering and review of radiographic studies, pulse oximetry and re-evaluation of patient's condition.  MDM   Final diagnoses:  None   The chart was scribed for me under my direct supervision.  I personally performed the history, physical, and medical decision making and all procedures in the evaluation of this patient..   I personally performed the services described in this documentation, which was scribed in my presence. The recorded information has been reviewed and is accurate.    Maudry Diego, MD 02/23/14 2009

## 2014-02-23 NOTE — ED Notes (Signed)
Pt comes from [facility] via EMS after staff called out due to low hemoglobin result. Pt's hemoglobin resulted 5.6 from blood draw done yesterday. Staff reports pt "has been weaker than usual". Pt has hx of Alzheimer's.

## 2014-02-23 NOTE — H&P (Addendum)
PCP:   Delphina Cahill, MD   Chief Complaint:  Abnormal labs  HPI: 80 year old female who   has a past medical history of Hypertension; Thyroid disease; Cancer; Hypothyroid; Alzheimer disease; Osteoporosis; Hip fracture; LVH (left ventricular hypertrophy); Acute on chronic diastolic CHF (congestive heart failure) (07/09/2012.); CKD (chronic kidney disease) stage 4, GFR 15-29 ml/min (09/23/2011); and Dementia. Patient resides at [facility], and was brought by EMS for abnormal labs and weakness. Patient had blood drawn yesterday which showed a hemoglobin of 5.6 and nursing staff reported the patient was weaker than usual. Patient has dementia and mostly nonverbal and total care. She is unable to provide history patient is very somnolent at this time. History provided by the patient's healthcare power of attorney at bedside. In the ED patient was found to have hemoglobin of 5.1, stool was guaiac positive, also patient was hypothermic with temperature 93.4. Urine analysis showed positive nitrite, wbc's and bacteria. Patient is currently on  bear hugger  Blanket, and is receiving first unit of PRBC. Patient also has history of CHF, and has hypoxia with O2 sats ranging 80- 90%. Lasix 40 mg IV has been given in the ED. Patient's BNP was found to be elevated to 6266.0, chest x-ray showed cardiomegaly with bilateral airspace opacities and effusion. Patient's last echo from 2013 showed grade 1 diastolic dysfunction with EF 60-65%. Patient had a colonoscopy in December 2012 which showed evidence of diverticulosis.  Allergies:   Allergies  Allergen Reactions  . Ace Inhibitors Other (See Comments)    unknown  . Penicillins Other (See Comments)    unknown  . Sulfa Antibiotics Other (See Comments)    unknown      Past Medical History  Diagnosis Date  . Hypertension   . Thyroid disease     hypothyroid  . Cancer     breast  . Hypothyroid   . Alzheimer disease   . Osteoporosis   . Hip  fracture   . LVH (left ventricular hypertrophy)   . Acute on chronic diastolic CHF (congestive heart failure) 07/09/2012.  Marland Kitchen CKD (chronic kidney disease) stage 4, GFR 15-29 ml/min 09/23/2011  . Dementia     Past Surgical History  Procedure Laterality Date  . Mastectomy      right    Prior to Admission medications   Medication Sig Start Date End Date Taking? Authorizing Provider  amLODipine (NORVASC) 10 MG tablet Take 10 mg by mouth daily.   Yes Historical Provider, MD  carvedilol (COREG) 6.25 MG tablet Take 1 tablet (6.25 mg total) by mouth 2 (two) times daily with a meal. 07/13/12  Yes Rexene Alberts, MD  cetirizine (QC ALL DAY ALLERGY) 10 MG tablet Take 5 mg by mouth every 6 (six) hours as needed (for itching).   Yes Historical Provider, MD  cholecalciferol (VITAMIN D) 1000 UNITS tablet Take 1,000 Units by mouth 2 (two) times daily.   Yes Historical Provider, MD  divalproex (DEPAKOTE) 125 MG DR tablet Take 125 mg by mouth at bedtime.   Yes Historical Provider, MD  ENSURE (ENSURE) Take 237 mLs by mouth 2 (two) times daily.    Yes Historical Provider, MD  furosemide (LASIX) 20 MG tablet Take 20 mg by mouth daily. Weight gain of 3 pounds in 24 hours   Yes Historical Provider, MD  levothyroxine (SYNTHROID, LEVOTHROID) 88 MCG tablet Take 88 mcg by mouth daily.   Yes Historical Provider, MD  polyethylene glycol (MIRALAX / GLYCOLAX) packet Take 17 g by  mouth daily. constipation   Yes Historical Provider, MD  sertraline (ZOLOFT) 25 MG tablet Take 25 mg by mouth daily.     Yes Historical Provider, MD  acetaminophen (TYLENOL) 325 MG tablet Take 2 tablets (650 mg total) by mouth every 6 (six) hours as needed for fever. 07/13/12   Rexene Alberts, MD  guaiFENesin-dextromethorphan (ROBITUSSIN DM) 100-10 MG/5ML syrup Take 5 mLs by mouth 3 (three) times daily as needed for cough.    Historical Provider, MD  loperamide (IMODIUM) 2 MG capsule Take 2 mg by mouth 4 (four) times daily as needed for diarrhea or  loose stools.    Historical Provider, MD  LORazepam (ATIVAN) 0.5 MG tablet Take 0.25 mg by mouth every 4 (four) hours as needed for anxiety.     Historical Provider, MD  promethazine (PHENERGAN) 25 MG tablet Take 25 mg by mouth every 6 (six) hours as needed for nausea.    Historical Provider, MD  zinc oxide 20 % ointment Apply 1 application topically 3 (three) times daily as needed. *One application applied to affected area(s) of buttocks three times daily as needed for redness    Historical Provider, MD    Social History:  reports that she has never smoked. She has never used smokeless tobacco. She reports that she does not drink alcohol or use illicit drugs.      Review of Systems:  Patient has dementia and is nonverbal, of systems not obtainable   Physical Exam: Blood pressure 123/69, pulse 66, temperature 94.8 F (34.9 C), temperature source Core (Comment), resp. rate 18, SpO2 86.00%. Constitutional:   Patient is stuporous, responds to painful stimuli. Head: Normocephalic and atraumatic Cardiovascular: RRR, S1 normal, S2 normal Pulmonary/Chest: CTAB, no wheezes, rales, or rhonchi Abdominal: Soft. Non-tender, non-distended, bowel sounds are normal, no masses, organomegaly, or guarding present.  Neurological: Stuporous, responds to sternal rub, unable to follow commands. Extremities : No Cyanosis, Clubbing or Edema  Labs on Admission:  Basic Metabolic Panel:  Recent Labs Lab 02/23/14 1745  NA 140  K 4.9  CL 105  CO2 22  GLUCOSE 96  BUN 75*  CREATININE 2.49*  CALCIUM 9.0   Liver Function Tests: No results found for this basename: AST, ALT, ALKPHOS, BILITOT, PROT, ALBUMIN,  in the last 168 hours No results found for this basename: LIPASE, AMYLASE,  in the last 168 hours No results found for this basename: AMMONIA,  in the last 168 hours CBC:  Recent Labs Lab 02/23/14 1745  WBC 4.8  NEUTROABS 3.3  HGB 5.1*  HCT 18.0*  MCV 61.9*  PLT 279   Cardiac Enzymes: No  results found for this basename: CKTOTAL, CKMB, CKMBINDEX, TROPONINI,  in the last 168 hours  BNP (last 3 results)  Recent Labs  02/23/14 1935  PROBNP 6266.0*   CBG: No results found for this basename: GLUCAP,  in the last 168 hours  Radiological Exams on Admission: Dg Chest Portable 1 View  02/23/2014   CLINICAL DATA:  Weakness, with history of hypertension.  EXAM: PORTABLE CHEST - 1 VIEW  COMPARISON:  01/12/2014.  FINDINGS: Cardiomegaly. Calcified tortuous aorta. Bilateral airspace opacities with effusions, probable pulmonary edema. Baseline chronic lung disease with interstitial prominence. Chronic deformity left humeral neck. Osteopenia. Worsening aeration from priors.  IMPRESSION: Cardiomegaly with bilateral airspace opacities and effusions, likely CHF.   Electronically Signed   By: Rolla Flatten M.D.   On: 02/23/2014 19:18    EKG: Independently reviewed. Wandering pacemaker   Assessment/Plan Active Problems:  Dementia   CKD (chronic kidney disease) stage 4, GFR 15-29 ml/min   Acute on chronic diastolic CHF (congestive heart failure)   Anemia   Sepsis   UTI (lower urinary tract infection)  Sepsis Patient is presenting with signs and symptoms of sepsis, due to UTI. We'll start the patient on Levaquin as patient has allergy to sulfa and penicillins. We'll follow the urine culture results. Will limit IV fluids due to CHF exacerbation. Patient is also hypothermic, we'll continue with the bear hugger blanket.  Acute on chronic diastolic CHF Patient is hypoxic with O2 sats ranging from 78% to 90% on Ventimask, Lasix 40 mg IV times one has been given in the ED. We'll monitor the patient closely on telemetry  Anemia Patient has history of diverticular bleed, hemoglobin is 5.1. 2 units PRBC has been ordered. Will give Lasix 20 mg IV x1 after first unit but is given as patient is also in CHF exacerbation. Patient is guaiac positive, we'll get GI consultation in  a.m.  Dementia Patient has a dementia, is totally bedbound. Continue Ativan when necessary for agitation  Goals of care- discussed with patient's healthcare power of attorney, Marchia Meiers, who agrees with blood transfusion and initial treatment but no heroic measures as patient is DO NOT RESUSCITATE. Will give 24-48 hours to see if patient improves with above measures, if patient continues to decline family will consider comfort measures.    Code status: Patient is DO NOT RESUSCITATE  Family discussion: Admission, patients condition and plan of care including tests being ordered have been discussed with the patient and healthcare power of attorney Vaughan Basta lamberthat bedside who indicate understanding and agree with the plan and Code Status.   Time Spent on Admission: 60 minutes  [facility] Pager: [phone] 02/23/2014, 8:34 PM  If 7PM-7AM, please contact night-coverage  [email or web address]  Password [email or web address]

## 2014-02-23 NOTE — ED Notes (Signed)
Patient resting in bed at this time. Bear hugger in place at this time from day shift due to low rectal temp. Blankets added for patient comfort. Family at bedside state understanding of treatments, no needs voiced.

## 2014-02-23 NOTE — Progress Notes (Signed)
ANTIBIOTIC CONSULT NOTE - INITIAL  Pharmacy Consult for Levaquin Indication: UTI  Allergies  Allergen Reactions  . Ace Inhibitors Other (See Comments)    unknown  . Penicillins Other (See Comments)    unknown  . Sulfa Antibiotics Other (See Comments)    unknown    Patient Measurements:   Adjusted Body Weight:   Vital Signs: Temp: 94.8 F (34.9 C) (07/06 2015) Temp src: Core (Comment) (07/06 2015) BP: 123/69 mmHg (07/06 2015) Pulse Rate: 66 (07/06 2015) Intake/Output from previous day:   Intake/Output from this shift:    Labs:  Recent Labs  02/23/14 1745  WBC 4.8  HGB 5.1*  PLT 279  CREATININE 2.49*   The CrCl is unknown because both a height and weight (above a minimum accepted value) are required for this calculation. No results found for this basename: VANCOTROUGH, VANCOPEAK, VANCORANDOM, GENTTROUGH, GENTPEAK, GENTRANDOM, TOBRATROUGH, TOBRAPEAK, TOBRARND, AMIKACINPEAK, AMIKACINTROU, AMIKACIN,  in the last 72 hours   Microbiology: No results found for this or any previous visit (from the past 720 hour(s)).  Medical History: Past Medical History  Diagnosis Date  . Hypertension   . Thyroid disease     hypothyroid  . Cancer     breast  . Hypothyroid   . Alzheimer disease   . Osteoporosis   . Hip fracture   . LVH (left ventricular hypertrophy)   . Acute on chronic diastolic CHF (congestive heart failure) 07/09/2012.  Marland Kitchen CKD (chronic kidney disease) stage 4, GFR 15-29 ml/min 09/23/2011  . Dementia     Medications:  Scheduled:  . [START ON 02/25/2014] levofloxacin (LEVAQUIN) IV  500 mg Intravenous Q48H  . levofloxacin (LEVAQUIN) IV  750 mg Intravenous Once   Assessment: Levaquin for UTI Patient weight approximately 50 kg Calculated CrCl 12 ml/min  Goal of Therapy:  Eradicate infection  Plan:  Levaquin 750 mg IV, then 500 mg IV every 48 hours Monitor renal function Labs per protocol F/U change to oral when appropriate  Abner Greenspan, Amie Cowens  Bennett 02/23/2014,8:48 PM

## 2014-02-24 DIAGNOSIS — A419 Sepsis, unspecified organism: Secondary | ICD-10-CM

## 2014-02-24 DIAGNOSIS — D500 Iron deficiency anemia secondary to blood loss (chronic): Secondary | ICD-10-CM

## 2014-02-24 DIAGNOSIS — G9340 Encephalopathy, unspecified: Secondary | ICD-10-CM

## 2014-02-24 LAB — HEMOGLOBIN AND HEMATOCRIT, BLOOD
HCT: 27.7 % — ABNORMAL LOW (ref 36.0–46.0)
HEMATOCRIT: 29 % — AB (ref 36.0–46.0)
Hemoglobin: 9.2 g/dL — ABNORMAL LOW (ref 12.0–15.0)
Hemoglobin: 8.7 g/dL — ABNORMAL LOW (ref 12.0–15.0)

## 2014-02-24 LAB — CBC
HCT: 27.7 % — ABNORMAL LOW (ref 36.0–46.0)
HEMOGLOBIN: 8.7 g/dL — AB (ref 12.0–15.0)
MCH: 21.1 pg — ABNORMAL LOW (ref 26.0–34.0)
MCHC: 31.4 g/dL (ref 30.0–36.0)
MCV: 67.1 fL — ABNORMAL LOW (ref 78.0–100.0)
Platelets: 296 10*3/uL (ref 150–400)
RBC: 4.13 MIL/uL (ref 3.87–5.11)
RDW: 21 % — ABNORMAL HIGH (ref 11.5–15.5)
WBC: 6.6 10*3/uL (ref 4.0–10.5)

## 2014-02-24 LAB — TYPE AND SCREEN
ABO/RH(D): A POS
Antibody Screen: NEGATIVE
UNIT DIVISION: 0
Unit division: 0

## 2014-02-24 LAB — COMPREHENSIVE METABOLIC PANEL
ALT: 16 U/L (ref 0–35)
ANION GAP: 16 — AB (ref 5–15)
AST: 19 U/L (ref 0–37)
Albumin: 2.9 g/dL — ABNORMAL LOW (ref 3.5–5.2)
Alkaline Phosphatase: 121 U/L — ABNORMAL HIGH (ref 39–117)
BUN: 72 mg/dL — AB (ref 6–23)
CALCIUM: 9.1 mg/dL (ref 8.4–10.5)
CO2: 22 mEq/L (ref 19–32)
CREATININE: 2.5 mg/dL — AB (ref 0.50–1.10)
Chloride: 106 mEq/L (ref 96–112)
GFR calc non Af Amer: 16 mL/min — ABNORMAL LOW (ref >90)
GFR, EST AFRICAN AMERICAN: 18 mL/min — AB (ref >90)
GLUCOSE: 93 mg/dL (ref 70–99)
Potassium: 4.2 mEq/L (ref 3.7–5.3)
SODIUM: 144 meq/L (ref 137–147)
TOTAL PROTEIN: 6.8 g/dL (ref 6.0–8.3)
Total Bilirubin: 1.4 mg/dL — ABNORMAL HIGH (ref 0.3–1.2)

## 2014-02-24 NOTE — Care Management Utilization Note (Signed)
UR completed 

## 2014-02-24 NOTE — Progress Notes (Signed)
[facility] PROGRESS NOTE  Beckie Viscardi TIW:[id number] DOB: 30-Mar-1918 DOA: 02/23/2014 PCP: Delphina Cahill, MD  Assessment/Plan: 1. Sepsis. The patient presented with signs and symptoms of sepsis. This was felt to be related to UTI. She's been started on levofloxacin. Followup urine culture. She was hypothermic on admission but this appears to be improving. 2. Acute on chronic diastolic congestive heart failure. Currently requiring a Ventimask. Continue Lasix twice a day and monitor urine output. 3. Anemia with heme positive stools. Hemoglobin 5.1 on admission. She received 2 units of PRBCs with improvement in her hemoglobin. Continue to monitor. Gastroenterology has been consulted and will see her on 7/8. Hopefully by then her respiratory status should have improved. 4. Dementia. Advanced dementia, patient is totally bed bound. Continue when necessary Ativan for agitation 5. Chronic kidney disease stage IV. Creatinine is currently at baseline  Code Status: DO NOT RESUSCITATE Family Communication: Discussed with legal guardian, Marchia Meiers Disposition Plan: Pending hospital course   Consultants:  Gastroenterology consultation in a.m.  Procedures:    Antibiotics:  Levaquin 7/7  HPI/Subjective: Patient does not answer questions. She is awake, does not follow commands  Objective: Filed Vitals:   02/24/14 1402  BP: 120/64  Pulse: 80  Temp: 98 F (36.7 C)  Resp: 18    Intake/Output Summary (Last 24 hours) at 02/24/14 1735 Last data filed at 02/24/14 0531  Gross per 24 hour  Intake   2800 ml  Output      0 ml  Net   2800 ml   Filed Weights   02/23/14 2335 02/24/14 0857  Weight: 52 kg (114 lb 10.2 oz) 48.6 kg (107 lb 2.3 oz)    Exam:   General:  NAD, mild increased respiratory effort  Cardiovascular: s1, s2, rrr  Respiratory: bilateral crackles  Abdomen: soft, nt, nd, bs+  Musculoskeletal: 1+ edema b/l   Data Reviewed: Basic Metabolic Panel:  Recent  Labs Lab 02/23/14 1745 02/24/14 0439  NA 140 144  K 4.9 4.2  CL 105 106  CO2 22 22  GLUCOSE 96 93  BUN 75* 72*  CREATININE 2.49* 2.50*  CALCIUM 9.0 9.1   Liver Function Tests:  Recent Labs Lab 02/24/14 0439  AST 19  ALT 16  ALKPHOS 121*  BILITOT 1.4*  PROT 6.8  ALBUMIN 2.9*   No results found for this basename: LIPASE, AMYLASE,  in the last 168 hours No results found for this basename: AMMONIA,  in the last 168 hours CBC:  Recent Labs Lab 02/23/14 1745 02/24/14 0439 02/24/14 1112 02/24/14 1617  WBC 4.8 6.6  --   --   NEUTROABS 3.3  --   --   --   HGB 5.1* 8.7* 9.2* 8.7*  HCT 18.0* 27.7* 29.0* 27.7*  MCV 61.9* 67.1*  --   --   PLT 279 296  --   --    Cardiac Enzymes: No results found for this basename: CKTOTAL, CKMB, CKMBINDEX, TROPONINI,  in the last 168 hours BNP (last 3 results)  Recent Labs  02/23/14 1935  PROBNP 6266.0*   CBG: No results found for this basename: GLUCAP,  in the last 168 hours  No results found for this or any previous visit (from the past 240 hour(s)).   Studies: Dg Chest Portable 1 View  02/23/2014   CLINICAL DATA:  Weakness, with history of hypertension.  EXAM: PORTABLE CHEST - 1 VIEW  COMPARISON:  01/12/2014.  FINDINGS: Cardiomegaly. Calcified tortuous aorta. Bilateral airspace opacities with effusions, probable pulmonary  edema. Baseline chronic lung disease with interstitial prominence. Chronic deformity left humeral neck. Osteopenia. Worsening aeration from priors.  IMPRESSION: Cardiomegaly with bilateral airspace opacities and effusions, likely CHF.   Electronically Signed   By: Rolla Flatten M.D.   On: 02/23/2014 19:18    Scheduled Meds: . carvedilol  6.25 mg Oral BID WC  . divalproex  125 mg Oral QHS  . [START ON 02/25/2014] levofloxacin (LEVAQUIN) IV  500 mg Intravenous Q48H  . levothyroxine  88 mcg Oral QAC breakfast  . polyethylene glycol  17 g Oral Daily  . sertraline  25 mg Oral Daily  . sodium chloride  3 mL  Intravenous Q12H  . sodium chloride  3 mL Intravenous Q12H   Continuous Infusions:   Active Problems:   Dementia   CKD (chronic kidney disease) stage 4, GFR 15-29 ml/min   Acute on chronic diastolic CHF (congestive heart failure)   Anemia   Sepsis   UTI (lower urinary tract infection)    Time spent: 68mins    Nyasha Rahilly  [facility] Pager [phone]. If 7PM-7AM, please contact night-coverage at [email or web address], password [facility] 02/24/2014, 5:35 PM  LOS: 1 day

## 2014-02-24 NOTE — Clinical Social Work Psychosocial (Signed)
Clinical Social Work Department BRIEF PSYCHOSOCIAL ASSESSMENT 02/24/2014  Patient:  Martha Pearson, Martha Pearson     Account Number:  [id number]     Admit date:  02/23/2014  Clinical Social Worker:  Wyatt Haste  Date/Time:  02/24/2014 03:07 PM  Referred by:  CSW  Date Referred:  02/24/2014 Referred for  ALF Placement   Other Referral:   Interview type:  Family Other interview type:   Vaughan Basta    PSYCHOSOCIAL DATA Living Status:  FACILITY Admitted from facility:  [facility] Level of care:  Assisted Living Primary support name:  Vaughan Basta Primary support relationship to patient:  FAMILY Degree of support available:   supportive    CURRENT CONCERNS Current Concerns  Post-Acute Placement   Other Concerns:   Lid    SOCIAL WORK ASSESSMENT / PLAN CSW spoke with Vaughan Basta, who is the wife to pt's cousin. Vaughan Basta reports she is also legal guardian. Pt is not oriented. Brought to ED after labs showed hemoglobin of 5.6. Pt also found to be hypoxic and showing signs of sepsis due to UTI. Pt has been resident at [facility] since 2011. Vaughan Basta is able to visit pt regularly. She indicates it was discussed with her yesterday that plan is to continue treatment and see how pt responds, considering comfort care if pt declines. Per Tammy, RN at [facility], pt has been on dementia unit since admission to facility. At baseline, pt is close to a total assist. She uses wheelchair at facility. Pt responds to very simple questions, but only with one word answers. Tammy reports pt had no symptoms other than some lethargy. Pt can be somewhat aggressive, particularly with home health according to [facility]. Okay for return at d/c.   Assessment/plan status:  Psychosocial Support/Ongoing Assessment of Needs Other assessment/ plan:   Information/referral to community resources:   [employer]    PATIENT'S/FAMILY'S RESPONSE TO PLAN OF CARE: Pt unable to discuss plan of care. Vaughan Basta reports positive  feelings regarding return to [facility] if appropriate, stating they have been happy there. Agreeable to [facility] keeping facility updated. CSW will continue to follow.       Benay Pike, [place]

## 2014-02-25 ENCOUNTER — Encounter (HOSPITAL_COMMUNITY): Payer: Self-pay | Admitting: Gastroenterology

## 2014-02-25 DIAGNOSIS — R195 Other fecal abnormalities: Secondary | ICD-10-CM

## 2014-02-25 LAB — HEPATIC FUNCTION PANEL
ALBUMIN: 2.8 g/dL — AB (ref 3.5–5.2)
ALT: 15 U/L (ref 0–35)
AST: 17 U/L (ref 0–37)
Alkaline Phosphatase: 117 U/L (ref 39–117)
BILIRUBIN TOTAL: 0.4 mg/dL (ref 0.3–1.2)
Bilirubin, Direct: <0.2 mg/dL (ref 0.0–0.3)
TOTAL PROTEIN: 6.2 g/dL (ref 6.0–8.3)

## 2014-02-25 LAB — BASIC METABOLIC PANEL
Anion gap: 17 — ABNORMAL HIGH (ref 5–15)
BUN: 58 mg/dL — ABNORMAL HIGH (ref 6–23)
CHLORIDE: 111 meq/L (ref 96–112)
CO2: 20 mEq/L (ref 19–32)
Calcium: 8.9 mg/dL (ref 8.4–10.5)
Creatinine, Ser: 2.56 mg/dL — ABNORMAL HIGH (ref 0.50–1.10)
GFR calc non Af Amer: 15 mL/min — ABNORMAL LOW (ref >90)
GFR, EST AFRICAN AMERICAN: 18 mL/min — AB (ref >90)
GLUCOSE: 84 mg/dL (ref 70–99)
POTASSIUM: 3.7 meq/L (ref 3.7–5.3)
SODIUM: 148 meq/L — AB (ref 137–147)

## 2014-02-25 LAB — CBC
HEMATOCRIT: 29.2 % — AB (ref 36.0–46.0)
Hemoglobin: 8.9 g/dL — ABNORMAL LOW (ref 12.0–15.0)
MCH: 20.6 pg — ABNORMAL LOW (ref 26.0–34.0)
MCHC: 30.5 g/dL (ref 30.0–36.0)
MCV: 67.6 fL — AB (ref 78.0–100.0)
Platelets: 276 10*3/uL (ref 150–400)
RBC: 4.32 MIL/uL (ref 3.87–5.11)
RDW: 20.9 % — AB (ref 11.5–15.5)
WBC: 5.8 10*3/uL (ref 4.0–10.5)

## 2014-02-25 LAB — HEMOGLOBIN AND HEMATOCRIT, BLOOD
HCT: 29.3 % — ABNORMAL LOW (ref 36.0–46.0)
HCT: 29.5 % — ABNORMAL LOW (ref 36.0–46.0)
HCT: 30.7 % — ABNORMAL LOW (ref 36.0–46.0)
Hemoglobin: 8.9 g/dL — ABNORMAL LOW (ref 12.0–15.0)
Hemoglobin: 9.2 g/dL — ABNORMAL LOW (ref 12.0–15.0)
Hemoglobin: 9.3 g/dL — ABNORMAL LOW (ref 12.0–15.0)

## 2014-02-25 LAB — URINE CULTURE: Colony Count: 15000

## 2014-02-25 MED ORDER — FUROSEMIDE 20 MG PO TABS
20.0000 mg | ORAL_TABLET | Freq: Two times a day (BID) | ORAL | Status: DC
  Administered 2014-02-25: 20 mg via ORAL
  Filled 2014-02-25: qty 1

## 2014-02-25 MED ORDER — PANTOPRAZOLE SODIUM 40 MG IV SOLR
40.0000 mg | INTRAVENOUS | Status: DC
Start: 2014-02-25 — End: 2014-02-26
  Filled 2014-02-25: qty 40

## 2014-02-25 NOTE — Progress Notes (Signed)
ANTIBIOTIC CONSULT NOTE - follow up  Pharmacy Consult for Levaquin Indication: UTI  Allergies  Allergen Reactions  . Ace Inhibitors Other (See Comments)    unknown  . Penicillins Other (See Comments)    unknown  . Sulfa Antibiotics Other (See Comments)    unknown   Patient Measurements: Height: 5\' 2"  (157.5 cm) Weight: 107 lb 2.3 oz (48.6 kg) IBW/kg (Calculated) : 50.1  Vital Signs: Temp: 98.4 F (36.9 C) (07/08 0347) Temp src: Axillary (07/08 0347) BP: 169/75 mmHg (07/08 0347) Pulse Rate: 72 (07/08 0347) Intake/Output from previous day: 07/07 0701 - 07/08 0700 In: 2550  Out: -  Intake/Output from this shift:    Labs:  Recent Labs  02/23/14 1745 02/24/14 0439  02/24/14 2354 02/25/14 0811 02/25/14 0812  WBC 4.8 6.6  --   --  5.8  --   HGB 5.1* 8.7*  < > 9.2* 8.9* 8.9*  PLT 279 296  --   --  276  --   CREATININE 2.49* 2.50*  --   --  2.56*  --   < > = values in this interval not displayed. Estimated Creatinine Clearance: 10.5 ml/min (by C-G formula based on Cr of 2.56). No results found for this basename: VANCOTROUGH, Corlis Leak, VANCORANDOM, GENTTROUGH, GENTPEAK, GENTRANDOM, TOBRATROUGH, TOBRAPEAK, TOBRARND, AMIKACINPEAK, AMIKACINTROU, AMIKACIN,  in the last 72 hours   Microbiology: Recent Results (from the past 720 hour(s))  URINE CULTURE     Status: None   Collection Time    02/24/14  2:15 AM      Result Value Ref Range Status   Specimen Description URINE, CATHETERIZED   Final   Special Requests NONE   Final   Culture  Setup Time     Final   Value: 02/24/2014 14:22     Performed at [facility]     Final   Value: 15,000 COLONIES/ML     Performed at [facility]   Culture     Final   Value: Multiple bacterial morphotypes present, none predominant. Suggest appropriate recollection if clinically indicated.     Performed at [facility]   Report Status 02/25/2014 FINAL   Final   Medical History: Past Medical  History  Diagnosis Date  . Hypertension   . Thyroid disease     hypothyroid  . Cancer     breast  . Hypothyroid   . Alzheimer disease   . Osteoporosis   . Hip fracture   . LVH (left ventricular hypertrophy)   . Acute on chronic diastolic CHF (congestive heart failure) 07/09/2012.  Marland Kitchen CKD (chronic kidney disease) stage 4, GFR 15-29 ml/min 09/23/2011  . Dementia    Medications:  Scheduled:  . carvedilol  6.25 mg Oral BID WC  . divalproex  125 mg Oral QHS  . levofloxacin (LEVAQUIN) IV  500 mg Intravenous Q48H  . levothyroxine  88 mcg Oral QAC breakfast  . pantoprazole (PROTONIX) IV  40 mg Intravenous Q24H  . polyethylene glycol  17 g Oral Daily  . sertraline  25 mg Oral Daily  . sodium chloride  3 mL Intravenous Q12H  . sodium chloride  3 mL Intravenous Q12H   Assessment: 80yo female started on Levaquin for UTI Pt has CKD.  Estimated Creatinine Clearance: 10.5 ml/min (by C-G formula based on Cr of 2.56).  Goal of Therapy:  Eradicate infection  Plan:   Levaquin 500 mg IV every 48 hours  Monitor renal function, labs, and cultures  change to oral when appropriate  Hart Robinsons A 02/25/2014,2:02 PM

## 2014-02-25 NOTE — Progress Notes (Signed)
Patient refused lab draws this afternoon after several attempts.  Dr. Verlon Au on unit and notified.

## 2014-02-25 NOTE — Consult Note (Signed)
Referring Provider: Dr. Roderic Palau Primary Care Physician:  Delphina Cahill, MD Primary Gastroenterologist:  Dr. Gala Romney   Date of Admission: 02/24/14 Date of Consultation: 02/25/14  Reason for Consultation: Heme positive stool, profound anemia  HPI:  Martha Pearson is a 80 year old female who resides at [facility], brought to the ED secondary to abnormal labs and weakness. Outside labs performed by nursing home with Hgb 5.6. Admitting Hgb in ED 5.1. Clinical picture of sepsis due to UTI. Acute on chronic CHF, with hypoxia and on ventimask at admission. Hypothermic on admission. Clinically improved since admission, now on 2 liters nasal cannula and no signs of distress. Temperature normalized. Heme positive stool. Patient with history of dementia, does not respond to my questions. Per nursing staff, no evidence of melena, hematochezia, or hematemesis. Episode of vomiting overnight. Colonoscopy in 2012 performed during admission for lower GI bleed, etiology diverticular, requiring hemostasis/clips. Tubular adenoma noted. Does not appear she has had an upper GI evaluation. Unable to obtain history from patient. Does not appear to be on any NSAIDs, anticoagulation as an outpatient. Hgb improved after 2 units PRBCs.    Past Medical History  Diagnosis Date  . Hypertension   . Thyroid disease     hypothyroid  . Cancer     breast  . Hypothyroid   . Alzheimer disease   . Osteoporosis   . Hip fracture   . LVH (left ventricular hypertrophy)   . Acute on chronic diastolic CHF (congestive heart failure) 07/09/2012.  Marland Kitchen CKD (chronic kidney disease) stage 4, GFR 15-29 ml/min 09/23/2011  . Dementia     Past Surgical History  Procedure Laterality Date  . Mastectomy      right  . Colonoscopy  Dec 2011    Dr. Gala Romney: normal rectum, pancolonic diverticula, fixed clot in sigmoid s/p hemostasis and clipping , tubular adenoma    Prior to Admission medications   Medication Sig Start Date End Date Taking?  Authorizing Provider  amLODipine (NORVASC) 10 MG tablet Take 10 mg by mouth daily.   Yes Historical Provider, MD  carvedilol (COREG) 6.25 MG tablet Take 1 tablet (6.25 mg total) by mouth 2 (two) times daily with a meal. 07/13/12  Yes Rexene Alberts, MD  cetirizine (QC ALL DAY ALLERGY) 10 MG tablet Take 5 mg by mouth every 6 (six) hours as needed (for itching).   Yes Historical Provider, MD  cholecalciferol (VITAMIN D) 1000 UNITS tablet Take 1,000 Units by mouth 2 (two) times daily.   Yes Historical Provider, MD  divalproex (DEPAKOTE) 125 MG DR tablet Take 125 mg by mouth at bedtime.   Yes Historical Provider, MD  ENSURE (ENSURE) Take 237 mLs by mouth 2 (two) times daily.    Yes Historical Provider, MD  furosemide (LASIX) 20 MG tablet Take 20 mg by mouth daily. Weight gain of 3 pounds in 24 hours   Yes Historical Provider, MD  levothyroxine (SYNTHROID, LEVOTHROID) 88 MCG tablet Take 88 mcg by mouth daily.   Yes Historical Provider, MD  polyethylene glycol (MIRALAX / GLYCOLAX) packet Take 17 g by mouth daily. constipation   Yes Historical Provider, MD  sertraline (ZOLOFT) 25 MG tablet Take 25 mg by mouth daily.     Yes Historical Provider, MD  acetaminophen (TYLENOL) 325 MG tablet Take 2 tablets (650 mg total) by mouth every 6 (six) hours as needed for fever. 07/13/12   Rexene Alberts, MD  guaiFENesin-dextromethorphan (ROBITUSSIN DM) 100-10 MG/5ML syrup Take 5 mLs by mouth 3 (three) times daily  as needed for cough.    Historical Provider, MD  loperamide (IMODIUM) 2 MG capsule Take 2 mg by mouth 4 (four) times daily as needed for diarrhea or loose stools.    Historical Provider, MD  LORazepam (ATIVAN) 0.5 MG tablet Take 0.25 mg by mouth every 4 (four) hours as needed for anxiety.     Historical Provider, MD  promethazine (PHENERGAN) 25 MG tablet Take 25 mg by mouth every 6 (six) hours as needed for nausea.    Historical Provider, MD  zinc oxide 20 % ointment Apply 1 application topically 3 (three) times  daily as needed. *One application applied to affected area(s) of buttocks three times daily as needed for redness    Historical Provider, MD    Current Facility-Administered Medications  Medication Dose Route Frequency Provider Last Rate Last Dose  . 0.9 %  sodium chloride infusion  250 mL Intravenous PRN Oswald Hillock, MD      . acetaminophen (TYLENOL) tablet 650 mg  650 mg Oral Q6H PRN Oswald Hillock, MD      . carvedilol (COREG) tablet 6.25 mg  6.25 mg Oral BID WC Oswald Hillock, MD      . divalproex (DEPAKOTE) DR tablet 125 mg  125 mg Oral QHS Oswald Hillock, MD      . levofloxacin (LEVAQUIN) IVPB 500 mg  500 mg Intravenous Q48H Oswald Hillock, MD      . levothyroxine (SYNTHROID, LEVOTHROID) tablet 88 mcg  88 mcg Oral QAC breakfast Oswald Hillock, MD      . LORazepam (ATIVAN) tablet 0.25 mg  0.25 mg Oral Q4H PRN Oswald Hillock, MD      . polyethylene glycol (MIRALAX / GLYCOLAX) packet 17 g  17 g Oral Daily Oswald Hillock, MD      . sertraline (ZOLOFT) tablet 25 mg  25 mg Oral Daily Oswald Hillock, MD      . sodium chloride 0.9 % injection 3 mL  3 mL Intravenous Q12H Oswald Hillock, MD   3 mL at 02/24/14 2200  . sodium chloride 0.9 % injection 3 mL  3 mL Intravenous Q12H Oswald Hillock, MD      . sodium chloride 0.9 % injection 3 mL  3 mL Intravenous PRN Oswald Hillock, MD        Allergies as of 02/23/2014 - Review Complete 02/23/2014  Allergen Reaction Noted  . Ace inhibitors Other (See Comments) 03/05/2011  . Penicillins Other (See Comments) 03/05/2011  . Sulfa antibiotics Other (See Comments) 03/05/2011    Family History  Problem Relation Age of Onset  . Colon cancer Neg Hx     History   Social History  . Marital Status: Widowed    Spouse Name: N/A    Number of Children: N/A  . Years of Education: N/A   Occupational History  . Not on file.   Social History Main Topics  . Smoking status: Never Smoker   . Smokeless tobacco: Never Used  . Alcohol Use: No  . Drug Use: No  . Sexual Activity:  Not on file   Other Topics Concern  . Not on file   Social History Narrative  . No narrative on file    Review of Systems: Unable to obtain  Physical Exam: Vital signs in last 24 hours: Temp:  [98 F (36.7 C)-98.5 F (36.9 C)] 98.4 F (36.9 C) (07/08 0347) Pulse Rate:  [72-84] 72 (07/08 0347) Resp:  [16-18]  16 (07/08 0347) BP: (120-169)/(64-75) 169/75 mmHg (07/08 0347) SpO2:  [90 %-91 %] 90 % (07/08 0800)   General:   Alert, appears stated age, non-verbal, curled in fetal position.  Head:  Normocephalic and atraumatic. Eyes:  Sclera clear, no icterus.   Conjunctiva pink. Ears:  Unknown auditory acuity Nose:  No deformity, discharge,  or lesions. Mouth:  Unable to assess, patient will not cooperate Neck:  Supple; no masses or thyromegaly. Lungs:  Coarse, diminished in bases. 2 liters nasal cannula Heart:  S1 S2 present without murmurs Abdomen:  Punching the air while attempting exam, attempting to hit examiner. Guarding abdomen. Able to auscultate bowel sounds and only partial palpation of abdomen due to patient's uncooperation/dementia Rectal:  Deferred  Msk:  Kyphosis, arthritis changes to hands/feet Neurologic:  Unable to assess Skin:  Fragile skin, lower extremities with skin tears  Intake/Output from previous day: 07/07 0701 - 07/08 0700 In: 2550  Out: -  Intake/Output this shift:    Lab Results:  Recent Labs  02/23/14 1745 02/24/14 0439  02/24/14 2354 02/25/14 0811 02/25/14 0812  WBC 4.8 6.6  --   --  5.8  --   HGB 5.1* 8.7*  < > 9.2* 8.9* 8.9*  HCT 18.0* 27.7*  < > 29.5* 29.2* 29.3*  PLT 279 296  --   --  276  --   < > = values in this interval not displayed. BMET  Recent Labs  02/23/14 1745 02/24/14 0439 02/25/14 0811  NA 140 144 148*  K 4.9 4.2 3.7  CL 105 106 111  CO2 22 22 20   GLUCOSE 96 93 84  BUN 75* 72* 58*  CREATININE 2.49* 2.50* 2.56*  CALCIUM 9.0 9.1 8.9   LFT  Recent Labs  02/24/14 0439  PROT 6.8  ALBUMIN 2.9*  AST 19   ALT 16  ALKPHOS 121*  BILITOT 1.4*    Studies/Results: Dg Chest Portable 1 View  02/23/2014   CLINICAL DATA:  Weakness, with history of hypertension.  EXAM: PORTABLE CHEST - 1 VIEW  COMPARISON:  01/12/2014.  FINDINGS: Cardiomegaly. Calcified tortuous aorta. Bilateral airspace opacities with effusions, probable pulmonary edema. Baseline chronic lung disease with interstitial prominence. Chronic deformity left humeral neck. Osteopenia. Worsening aeration from priors.  IMPRESSION: Cardiomegaly with bilateral airspace opacities and effusions, likely CHF.   Electronically Signed   By: Rolla Flatten M.D.   On: 02/23/2014 19:18    Impression: 80 year old female admitted with profound anemia, heme positive stool, but no overt signs of GI bleeding. Unable to gather complete history due to dementia, with majority of information from nursing staff and prior epic documentation. Multiple other issues as described in HPI, but she has clinically improved from a respiratory standpoint since admission. Improvement in Hgb s/p 2 units PRBCs. Last colonoscopy in 2012 with diverticula, tubular adenoma. Unknown if she has ever had an upper endoscopy. No known presence of NSAIDs routinely. Would benefit from EGD with colonoscopy at a later date if EGD negative. As noted, patient's clinical status has improved, and she is stable from a respiratory standpoint for a procedure. Remain NPO for now with likely upper endoscopy later today.   Plan: Remain NPO Add PPI for GI prophylaxis Anticipate EGD today with Dr. Oneida Alar. Consider risks/benefits of colonoscopy if EGD negative  Orvil Feil, ANP-BC [facility]astroenter[facility]     LOS: 2 days    02/25/2014, 9:04 AM

## 2014-02-25 NOTE — Care Management Note (Signed)
    Page 1 of 1   02/27/2014     4:16:37 PM CARE MANAGEMENT NOTE 02/27/2014  Patient:  Martha Pearson, Martha Pearson   Account Number:  [id number]  Date Initiated:  02/25/2014  Documentation initiated by:  Vladimir Creeks  Subjective/Objective Assessment:   Admitted with UTI and CHF. She has dementia.  Pt is from [facility], and will return there at D/C     Action/Plan:   CSW aware of admission   Anticipated DC Date:  02/27/2014   Anticipated DC Plan:  ASSISTED LIVING / REST HOME  In-house referral  Clinical Social Worker      DC Planning Services  CM consult      Choice offered to / List presented to:             Status of service:  Completed, signed off Medicare Important Message given?  YES (If response is "NO", the following Medicare IM given date fields will be blank) Date Medicare IM given:  02/27/2014 Medicare IM given by:  Vladimir Creeks Date Additional Medicare IM given:   Additional Medicare IM given by:    Discharge Disposition:  ASSISTED LIVING  Per UR Regulation:  Reviewed for med. necessity/level of care/duration of stay  If discussed at [place] of Stay Meetings, dates discussed:    Comments:  02/25/14/1500 Vladimir Creeks RN CM

## 2014-02-25 NOTE — Progress Notes (Signed)
Spoke in detail with Marchia Meiers, patient's legal guardian. Discussed risks and benefits. Agreeable to proceed.  Orvil Feil, ANP-BC [facility]

## 2014-02-25 NOTE — Progress Notes (Signed)
[facility] PROGRESS NOTE  Martha Pearson HQP:[id number] DOB: [birth year] DOA: 02/23/2014 PCP: Delphina Cahill, MD  80 yo ? admitted 12/28/91 c toxic metabolic encephalopathy, abla with hb of 5.6, hypothermia requiring bair hugger.  Ua showed + nitrites, wbc, bacteria.  CXR=bilateral opacities and effusions Transfused 2 u prbc and GI consulted. Family opting for egd to determine source of bleed as colonoscopy 2012 showed diverticular bleed with tubular adenoma  Assessment/Plan:  1. Sepsis-pyelonephritis. She's been started on levofloxacin. Followup urine culture shows only 15,000 CFU mixed colonies so far. She was hypothermic on admission but this appears to be improving.  Continue Empiric Abx for 4 days 2. Acute on chronic diastolic congestive heart failure. Not currently requiring a Ventimask. Continue Lasix po 20 mg bid.  Urine OP is inaccurate 3. Anemia with heme positive stools. Hemoglobin 5.1 on admission. She received 2 units of PRBCs-Hb 8.9. Continue to monitor. Gastroenterology plans on scope in am 02/26/14 4. Dementia. Advanced dementia, patient is totally bed bound. Continue when necessary Ativan for agitation.  Continue sertraline 25 mg 5. Hypothyroidism-continue Synthroid 88 mcg 6. Chronic kidney disease stage IV. Creatinine is currently at baseline  Code Status: DO NOT RESUSCITATE Family Communication: Discussed with legal guardian, Marchia Meiers on #[phone] Disposition Plan: Pending hospital course   Consultants:  Gastroenterology consultation in a.m.  Procedures:   Antibiotics:  Levaquin 7/7  HPI/Subjective:  Patient does not answer questions.  Sleeping but rouses Relative states doesn't tolerate sedatives too well  Objective: Filed Vitals:   02/25/14 0347  BP: 169/75  Pulse: 72  Temp: 98.4 F (36.9 C)  Resp: 16    Intake/Output Summary (Last 24 hours) at 02/25/14 1513 Last data filed at 02/25/14 1200  Gross per 24 hour  Intake   2670 ml  Output       0 ml  Net   2670 ml   Filed Weights   02/23/14 2335 02/24/14 0857  Weight: 52 kg (114 lb 10.2 oz) 48.6 kg (107 lb 2.3 oz)    Exam:   General:  NAD, mild increased respiratory effort  Cardiovascular: s1, s2, rrr  Respiratory: bilateral crackles  Abdomen: soft, nt, nd, bs+  Musculoskeletal: 1+ edema b/l   Data Reviewed: Basic Metabolic Panel:  Recent Labs Lab 02/23/14 1745 02/24/14 0439 02/25/14 0811  NA 140 144 148*  K 4.9 4.2 3.7  CL 105 106 111  CO2 22 22 20   GLUCOSE 96 93 84  BUN 75* 72* 58*  CREATININE 2.49* 2.50* 2.56*  CALCIUM 9.0 9.1 8.9   Liver Function Tests:  Recent Labs Lab 02/24/14 0439 02/25/14 0811  AST 19 17  ALT 16 15  ALKPHOS 121* 117  BILITOT 1.4* 0.4  PROT 6.8 6.2  ALBUMIN 2.9* 2.8*   No results found for this basename: LIPASE, AMYLASE,  in the last 168 hours No results found for this basename: AMMONIA,  in the last 168 hours CBC:  Recent Labs Lab 02/23/14 1745 02/24/14 0439 02/24/14 1112 02/24/14 1617 02/24/14 2354 02/25/14 0811 02/25/14 0812  WBC 4.8 6.6  --   --   --  5.8  --   NEUTROABS 3.3  --   --   --   --   --   --   HGB 5.1* 8.7* 9.2* 8.7* 9.2* 8.9* 8.9*  HCT 18.0* 27.7* 29.0* 27.7* 29.5* 29.2* 29.3*  MCV 61.9* 67.1*  --   --   --  67.6*  --   PLT 279 296  --   --   --  276  --    Cardiac Enzymes: No results found for this basename: CKTOTAL, CKMB, CKMBINDEX, TROPONINI,  in the last 168 hours BNP (last 3 results)  Recent Labs  02/23/14 1935  PROBNP 6266.0*   CBG: No results found for this basename: GLUCAP,  in the last 168 hours  Recent Results (from the past 240 hour(s))  URINE CULTURE     Status: None   Collection Time    02/24/14  2:15 AM      Result Value Ref Range Status   Specimen Description URINE, CATHETERIZED   Final   Special Requests NONE   Final   Culture  Setup Time     Final   Value: 02/24/2014 14:22     Performed at [place]     Final   Value: 15,000  COLONIES/ML     Performed at [facility]   Culture     Final   Value: Multiple bacterial morphotypes present, none predominant. Suggest appropriate recollection if clinically indicated.     Performed at [facility]   Report Status 02/25/2014 FINAL   Final     Studies: Dg Chest Portable 1 View  02/23/2014   CLINICAL DATA:  Weakness, with history of hypertension.  EXAM: PORTABLE CHEST - 1 VIEW  COMPARISON:  01/12/2014.  FINDINGS: Cardiomegaly. Calcified tortuous aorta. Bilateral airspace opacities with effusions, probable pulmonary edema. Baseline chronic lung disease with interstitial prominence. Chronic deformity left humeral neck. Osteopenia. Worsening aeration from priors.  IMPRESSION: Cardiomegaly with bilateral airspace opacities and effusions, likely CHF.   Electronically Signed   By: Rolla Flatten M.D.   On: 02/23/2014 19:18    Scheduled Meds: . carvedilol  6.25 mg Oral BID WC  . divalproex  125 mg Oral QHS  . levofloxacin (LEVAQUIN) IV  500 mg Intravenous Q48H  . levothyroxine  88 mcg Oral QAC breakfast  . pantoprazole (PROTONIX) IV  40 mg Intravenous Q24H  . polyethylene glycol  17 g Oral Daily  . sertraline  25 mg Oral Daily  . sodium chloride  3 mL Intravenous Q12H  . sodium chloride  3 mL Intravenous Q12H   Continuous Infusions:   Active Problems:   Dementia   CKD (chronic kidney disease) stage 4, GFR 15-29 ml/min   Acute on chronic diastolic CHF (congestive heart failure)   Anemia   Sepsis   UTI (lower urinary tract infection)    Time spent: 18mins   Verneita Griffes, MD [facility] [phone]

## 2014-02-25 NOTE — Consult Note (Signed)
REVIEWED. EGD JUL 9 IF FAMILY AGREEABLE. ADVANCE TO DYS 2 DIET. NPO AFTER MN.

## 2014-02-26 ENCOUNTER — Encounter (HOSPITAL_COMMUNITY)
Admission: EM | Disposition: A | Discharge status: Nursing Home (Includes ICF) | Payer: Self-pay | Source: Home / Self Care | Attending: Family Medicine

## 2014-02-26 ENCOUNTER — Encounter (HOSPITAL_COMMUNITY): Payer: Self-pay | Admitting: *Deleted

## 2014-02-26 HISTORY — PX: ESOPHAGOGASTRODUODENOSCOPY: SHX5428

## 2014-02-26 LAB — HEMOGLOBIN AND HEMATOCRIT, BLOOD
HEMATOCRIT: 27.5 % — AB (ref 36.0–46.0)
HEMATOCRIT: 30 % — AB (ref 36.0–46.0)
Hemoglobin: 8.5 g/dL — ABNORMAL LOW (ref 12.0–15.0)
Hemoglobin: 9.2 g/dL — ABNORMAL LOW (ref 12.0–15.0)

## 2014-02-26 SURGERY — EGD (ESOPHAGOGASTRODUODENOSCOPY)
Anesthesia: Moderate Sedation

## 2014-02-26 MED ORDER — MIDAZOLAM HCL 5 MG/5ML IJ SOLN
INTRAMUSCULAR | Status: DC | PRN
  Administered 2014-02-26 (×3): 1 mg via INTRAVENOUS

## 2014-02-26 MED ORDER — STERILE WATER FOR IRRIGATION IR SOLN
Status: DC | PRN
  Administered 2014-02-26: 12:00:00

## 2014-02-26 MED ORDER — POLYETHYLENE GLYCOL 3350 17 G PO PACK
17.0000 g | PACK | Freq: Two times a day (BID) | ORAL | Status: DC
  Administered 2014-02-27: 17 g via ORAL
  Filled 2014-02-26: qty 1

## 2014-02-26 MED ORDER — FUROSEMIDE 20 MG PO TABS
20.0000 mg | ORAL_TABLET | Freq: Every day | ORAL | Status: DC
  Administered 2014-02-27: 20 mg via ORAL
  Filled 2014-02-26: qty 1

## 2014-02-26 MED ORDER — LEVOFLOXACIN 500 MG PO TABS: 500.0000 mg | ORAL_TABLET | ORAL | Status: DC

## 2014-02-26 MED ORDER — MEPERIDINE HCL 100 MG/ML IJ SOLN
INTRAMUSCULAR | Status: DC | PRN
  Administered 2014-02-26: 25 mg via INTRAVENOUS

## 2014-02-26 MED ORDER — LIDOCAINE VISCOUS 2 % MT SOLN
OROMUCOSAL | Status: AC
  Filled 2014-02-26: qty 15

## 2014-02-26 MED ORDER — PANTOPRAZOLE SODIUM 40 MG PO TBEC
40.0000 mg | DELAYED_RELEASE_TABLET | Freq: Every day | ORAL | Status: DC
Start: 2014-02-27 — End: 2014-02-27
  Administered 2014-02-27: 40 mg via ORAL
  Filled 2014-02-26: qty 1

## 2014-02-26 MED ORDER — SODIUM CHLORIDE 0.9 % IV SOLN: INTRAVENOUS | Status: DC

## 2014-02-26 MED ORDER — MEPERIDINE HCL 100 MG/ML IJ SOLN
INTRAMUSCULAR | Status: AC
  Filled 2014-02-26: qty 2

## 2014-02-26 MED ORDER — MIDAZOLAM HCL 5 MG/5ML IJ SOLN
INTRAMUSCULAR | Status: AC
  Filled 2014-02-26: qty 10

## 2014-02-26 NOTE — Progress Notes (Addendum)
[facility] PROGRESS NOTE  Martha Pearson [id number] DOB: Aug 20, 1918 DOA: 02/23/2014 PCP: Delphina Cahill, MD  80 yo ? admitted 12/24/41 c toxic metabolic encephalopathy, abla with hb of 5.6, hypothermia requiring bair hugger.  Ua showed + nitrites, wbc, bacteria.  CXR=bilateral opacities and effusions Transfused 2 u prbc and GI consulted. Family opting for egd to determine source of bleed as colonoscopy 2012 showed diverticular bleed with tubular adenoma  Assessment/Plan:  1. Sepsis-pyelonephritis. She's been started on levofloxacin. Followup urine culture shows only 15,000 CFU mixed colonies so far. She was hypothermic on admission but this appears to be improving.  Continue Empiric Abx for 4 days-stop date = 02/26/14 2. Acute on chronic diastolic congestive heart failure. Not currently requiring a Ventimask. Continue Lasix po 20 mg daily as of 02/26/14  3. Anemia with heme positive stools. Hemoglobin 5.1 on admission. She received 2 units of PRBCs-Hb 8.9. Continue to monitor. Gastroenterology performed an endoscopy 02/26/14 = atrophic gastritis?-Await biopsies. Plan for GI Stool study as per my discussion with Dr. Oneida Alar colonoscopy was performed 2 years ago and was somewhat of a difficult task and risks clearly outweigh benefits in her case--I discussed with healthcare power of attorney that patient may need goals of care and I will further expand on this later during hospital stay.  I will stop checking blood every 8 hours and check only daily 4. Dementia. Advanced dementia, patient is totally bed bound. Continue when necessary Ativan for agitation.  Continue sertraline 25 mg 5. Hypothyroidism-continue Synthroid 88 mcg 6. Chronic kidney disease stage IV. Creatinine is currently at baseline-as she does not seem volume overloaded we will cut back her Lasix 20 daily  Code Status: DO NOT RESUSCITATE Family Communication: Discussed with legal guardian, Marchia Meiers on #[phone] Disposition Plan:  Pending hospital course   Consultants:  Gastroenterology   Procedures: Endoscopy 7/9 = ENDOSCOPIC IMPRESSION:  1. Stricture at the gastroesophageal junction  2. Medium sized hiatal hernia  3. MILD Non-erosive gastritis in the gastric antrum  4. Multiple GASTRIC polyps  5. MODERATE DUODENITIS/Multiple ulcers in the duodenal bulb  6. ANEMIA DUE TO GI BLOOD LOSS/CHRONIC RENAL INSUFFICINECY/POOR PO  INTAKE, POSSIBLY ATROPHIC GASTRITIS  Antibiotics:  Levaquin 7/7  HPI/Subjective:  Sleeping Not really rousable this is at her baseline  Objective: Filed Vitals:   02/26/14 1315  BP:   Pulse: 61  Temp:   Resp: 10    Intake/Output Summary (Last 24 hours) at 02/26/14 1501 Last data filed at 02/26/14 0709  Gross per 24 hour  Intake      0 ml  Output    600 ml  Net   -600 ml   Filed Weights   02/23/14 2335 02/24/14 0857  Weight: 52 kg (114 lb 10.2 oz) 48.6 kg (107 lb 2.3 oz)    Exam:   General:  NAD, mild increased respiratory effort  Cardiovascular: s1, s2, rrr  Respiratory: bilateral crackles  Abdomen: soft, nt, nd, bs+  Musculoskeletal: 1+ edema b/l   Data Reviewed: Basic Metabolic Panel:  Recent Labs Lab 02/23/14 1745 02/24/14 0439 02/25/14 0811  NA 140 144 148*  K 4.9 4.2 3.7  CL 105 106 111  CO2 22 22 20   GLUCOSE 96 93 84  BUN 75* 72* 58*  CREATININE 2.49* 2.50* 2.56*  CALCIUM 9.0 9.1 8.9   Liver Function Tests:  Recent Labs Lab 02/24/14 0439 02/25/14 0811  AST 19 17  ALT 16 15  ALKPHOS 121* 117  BILITOT 1.4* 0.4  PROT 6.8  6.2  ALBUMIN 2.9* 2.8*   No results found for this basename: LIPASE, AMYLASE,  in the last 168 hours No results found for this basename: AMMONIA,  in the last 168 hours CBC:  Recent Labs Lab 02/23/14 1745 02/24/14 0439  02/25/14 0811 02/25/14 0812 02/25/14 1749 02/26/14 0017 02/26/14 0741  WBC 4.8 6.6  --  5.8  --   --   --   --   NEUTROABS 3.3  --   --   --   --   --   --   --   HGB 5.1* 8.7*  < >  8.9* 8.9* 9.3* 8.5* 9.2*  HCT 18.0* 27.7*  < > 29.2* 29.3* 30.7* 27.5* 30.0*  MCV 61.9* 67.1*  --  67.6*  --   --   --   --   PLT 279 296  --  276  --   --   --   --   < > = values in this interval not displayed. Cardiac Enzymes: No results found for this basename: CKTOTAL, CKMB, CKMBINDEX, TROPONINI,  in the last 168 hours BNP (last 3 results)  Recent Labs  02/23/14 1935  PROBNP 6266.0*   CBG: No results found for this basename: GLUCAP,  in the last 168 hours  Recent Results (from the past 240 hour(s))  URINE CULTURE     Status: None   Collection Time    02/24/14  2:15 AM      Result Value Ref Range Status   Specimen Description URINE, CATHETERIZED   Final   Special Requests NONE   Final   Culture  Setup Time     Final   Value: 02/24/2014 14:22     Performed at [place]     Final   Value: 15,000 COLONIES/ML     Performed at [facility]   Culture     Final   Value: Multiple bacterial morphotypes present, none predominant. Suggest appropriate recollection if clinically indicated.     Performed at [facility]   Report Status 02/25/2014 FINAL   Final     Studies: No results found.  Scheduled Meds: . carvedilol  6.25 mg Oral BID WC  . divalproex  125 mg Oral QHS  . furosemide  20 mg Oral BID  . [START ON 02/27/2014] levofloxacin  500 mg Oral Q48H  . levothyroxine  88 mcg Oral QAC breakfast  . lidocaine      . meperidine      . midazolam      . [START ON 02/27/2014] pantoprazole  40 mg Oral QAC breakfast  . polyethylene glycol  17 g Oral BID  . sertraline  25 mg Oral Daily  . sodium chloride  3 mL Intravenous Q12H  . sodium chloride  3 mL Intravenous Q12H   Continuous Infusions:   Active Problems:   Dementia   CKD (chronic kidney disease) stage 4, GFR 15-29 ml/min   Acute on chronic diastolic CHF (congestive heart failure)   Anemia   Sepsis   UTI (lower urinary tract infection)    Time spent: 3mins   Verneita Griffes, MD [facility] [phone]

## 2014-02-26 NOTE — Progress Notes (Signed)
ANTIBIOTIC CONSULT NOTE - follow up  Pharmacy Consult for Levaquin Indication: UTI  Allergies  Allergen Reactions  . Ace Inhibitors Other (See Comments)    unknown  . Penicillins Other (See Comments)    unknown  . Sulfa Antibiotics Other (See Comments)    unknown   Patient Measurements: Height: 5\' 2"  (157.5 cm) Weight: 107 lb 2.3 oz (48.6 kg) IBW/kg (Calculated) : 50.1  Vital Signs: Temp: 97.4 F (36.3 C) (07/09 0708) Temp src: Axillary (07/09 0708) BP: 134/57 mmHg (07/09 1310) Pulse Rate: 61 (07/09 1315) Intake/Output from previous day: 07/08 0701 - 07/09 0700 In: 120 [P.O.:120] Out: 300 [Urine:300] Intake/Output from this shift: Total I/O In: -  Out: 300 [Urine:300]  Labs:  Recent Labs  02/23/14 1745 02/24/14 0439  02/25/14 0811  02/25/14 1749 02/26/14 0017 02/26/14 0741  WBC 4.8 6.6  --  5.8  --   --   --   --   HGB 5.1* 8.7*  < > 8.9*  < > 9.3* 8.5* 9.2*  PLT 279 296  --  276  --   --   --   --   CREATININE 2.49* 2.50*  --  2.56*  --   --   --   --   < > = values in this interval not displayed.  Microbiology: Recent Results (from the past 720 hour(s))  URINE CULTURE     Status: None   Collection Time    02/24/14  2:15 AM      Result Value Ref Range Status   Specimen Description URINE, CATHETERIZED   Final   Special Requests NONE   Final   Culture  Setup Time     Final   Value: 02/24/2014 14:22     Performed at [place]     Final   Value: 15,000 COLONIES/ML     Performed at [facility]   Culture     Final   Value: Multiple bacterial morphotypes present, none predominant. Suggest appropriate recollection if clinically indicated.     Performed at [facility]   Report Status 02/25/2014 FINAL   Final   Medical History: Past Medical History  Diagnosis Date  . Hypertension   . Thyroid disease     hypothyroid  . Cancer     breast  . Hypothyroid   . Alzheimer disease   . Osteoporosis   . Hip  fracture   . LVH (left ventricular hypertrophy)   . Acute on chronic diastolic CHF (congestive heart failure) 07/09/2012.  Marland Kitchen CKD (chronic kidney disease) stage 4, GFR 15-29 ml/min 09/23/2011  . Dementia    Medications:  Scheduled:  . carvedilol  6.25 mg Oral BID WC  . divalproex  125 mg Oral QHS  . furosemide  20 mg Oral BID  . levofloxacin (LEVAQUIN) IV  500 mg Intravenous Q48H  . levothyroxine  88 mcg Oral QAC breakfast  . lidocaine      . meperidine      . midazolam      . pantoprazole (PROTONIX) IV  40 mg Intravenous Q24H  . polyethylene glycol  17 g Oral Daily  . sertraline  25 mg Oral Daily  . sodium chloride  3 mL Intravenous Q12H  . sodium chloride  3 mL Intravenous Q12H   Assessment: 80yo female started on Levaquin for UTI.  Pt is afebrile.   Pt has CKD.  Estimated Creatinine Clearance: 10.5 ml/min (by C-G formula based on  Cr of 2.56).   Urine culture noncontributory.    Goal of Therapy:  Eradicate infection  Plan:   Levaquin 500 mg PO every 48 hours  Monitor renal function, labs, progress and cultures  Hart Robinsons A 02/26/2014,1:22 PM

## 2014-02-26 NOTE — H&P (Signed)
Primary Care Physician:  Delphina Cahill, MD Primary Gastroenterologist:  Dr. Oneida Alar  Pre-Procedure History & Physical: HPI:  Martha Pearson is a 80 y.o. female here for ANEMIA.  Past Medical History  Diagnosis Date  . Hypertension   . Thyroid disease     hypothyroid  . Cancer     breast  . Hypothyroid   . Alzheimer disease   . Osteoporosis   . Hip fracture   . LVH (left ventricular hypertrophy)   . Acute on chronic diastolic CHF (congestive heart failure) 07/09/2012.  Marland Kitchen CKD (chronic kidney disease) stage 4, GFR 15-29 ml/min 09/23/2011  . Dementia     Past Surgical History  Procedure Laterality Date  . Mastectomy      right  . Colonoscopy  Dec 2011    Dr. Gala Romney: normal rectum, pancolonic diverticula, fixed clot in sigmoid s/p hemostasis and clipping , tubular adenoma    Prior to Admission medications   Medication Sig Start Date End Date Taking? Authorizing Provider  amLODipine (NORVASC) 10 MG tablet Take 10 mg by mouth daily.   Yes Historical Provider, MD  carvedilol (COREG) 6.25 MG tablet Take 1 tablet (6.25 mg total) by mouth 2 (two) times daily with a meal. 07/13/12  Yes Rexene Alberts, MD  cetirizine (QC ALL DAY ALLERGY) 10 MG tablet Take 5 mg by mouth every 6 (six) hours as needed (for itching).   Yes Historical Provider, MD  cholecalciferol (VITAMIN D) 1000 UNITS tablet Take 1,000 Units by mouth 2 (two) times daily.   Yes Historical Provider, MD  divalproex (DEPAKOTE) 125 MG DR tablet Take 125 mg by mouth at bedtime.   Yes Historical Provider, MD  ENSURE (ENSURE) Take 237 mLs by mouth 2 (two) times daily.    Yes Historical Provider, MD  furosemide (LASIX) 20 MG tablet Take 20 mg by mouth daily. Weight gain of 3 pounds in 24 hours   Yes Historical Provider, MD  levothyroxine (SYNTHROID, LEVOTHROID) 88 MCG tablet Take 88 mcg by mouth daily.   Yes Historical Provider, MD  polyethylene glycol (MIRALAX / GLYCOLAX) packet Take 17 g by mouth daily. constipation   Yes Historical  Provider, MD  sertraline (ZOLOFT) 25 MG tablet Take 25 mg by mouth daily.     Yes Historical Provider, MD  acetaminophen (TYLENOL) 325 MG tablet Take 2 tablets (650 mg total) by mouth every 6 (six) hours as needed for fever. 07/13/12   Rexene Alberts, MD  guaiFENesin-dextromethorphan (ROBITUSSIN DM) 100-10 MG/5ML syrup Take 5 mLs by mouth 3 (three) times daily as needed for cough.    Historical Provider, MD  loperamide (IMODIUM) 2 MG capsule Take 2 mg by mouth 4 (four) times daily as needed for diarrhea or loose stools.    Historical Provider, MD  LORazepam (ATIVAN) 0.5 MG tablet Take 0.25 mg by mouth every 4 (four) hours as needed for anxiety.     Historical Provider, MD  promethazine (PHENERGAN) 25 MG tablet Take 25 mg by mouth every 6 (six) hours as needed for nausea.    Historical Provider, MD  zinc oxide 20 % ointment Apply 1 application topically 3 (three) times daily as needed. *One application applied to affected area(s) of buttocks three times daily as needed for redness    Historical Provider, MD    Allergies as of 02/23/2014 - Review Complete 02/23/2014  Allergen Reaction Noted  . Ace inhibitors Other (See Comments) 03/05/2011  . Penicillins Other (See Comments) 03/05/2011  . Sulfa antibiotics Other (See Comments) 03/05/2011  Family History  Problem Relation Age of Onset  . Colon cancer Neg Hx     History   Social History  . Marital Status: Widowed    Spouse Name: N/A    Number of Children: N/A  . Years of Education: N/A   Occupational History  . Not on file.   Social History Main Topics  . Smoking status: Never Smoker   . Smokeless tobacco: Never Used  . Alcohol Use: No  . Drug Use: No  . Sexual Activity: Not on file   Other Topics Concern  . Not on file   Social History Narrative  . No narrative on file    Review of Systems: See HPI, otherwise negative ROS   Physical Exam: BP 172/63  Pulse 56  Temp(Src) 97.4 F (36.3 C) (Axillary)  Resp 11  Ht 5'  2" (1.575 m)  Wt 107 lb 2.3 oz (48.6 kg)  BMI 19.59 kg/m2  SpO2 90% General:   Alert,  pleasant and cooperative in NAD Head:  Normocephalic and atraumatic. Neck:  Supple; Lungs:  Clear throughout to auscultation.    Heart:  Regular rate and rhythm. Abdomen:  Soft, nontender and nondistended. Normal bowel sounds, without guarding, and without rebound.   Neurologic:  Alert, NO NEW FOCAL DEFICITS   Impression/Plan:    Anemia  PLAN:  1. EGD/?GIVENS CAPSULE  TODAY

## 2014-02-26 NOTE — Op Note (Addendum)
[facility] [address] [place], [address]   ENDOSCOPY PROCEDURE REPORT  PATIENT: Martha Pearson, Martha Pearson  MR#: [id number] BIRTHDATE: 12-20-1917 , 48  yrs. old GENDER: Female  ENDOSCOPIST: Barney Drain, MD REFERRED PP:JKDT Hall, M.D. PROCEDURE DATE: 02/26/2014 PROCEDURE:   EGD w/ biopsy INDICATIONS:Iron deficiency anemia. MEDICATIONS: Demerol 25 mg IV and Versed 3 mg IV TOPICAL ANESTHETIC:   Viscous Xylocaine  DESCRIPTION OF PROCEDURE:     Physical exam was performed.  Informed consent was obtained from the patient after explaining the benefits, risks, and alternatives to the procedure.  The patient was connected to the monitor and placed in the left lateral position.  Continuous oxygen was provided by nasal cannula and IV medicine administered through an indwelling cannula.  After administration of sedation, the patients esophagus was intubated and the EG-[id number] ([id number])  endoscope was advanced under direct visualization to the second portion of the duodenum.  The scope was removed slowly by carefully examining the color, texture, anatomy, and integrity of the mucosa on the way out.  The patient was recovered in endoscopy and discharged home in satisfactory condition.   ESOPHAGUS: A stricture was found at the gastroesophageal junction. The stenosis was traversable with the endoscope.   A medium sized hiatal hernia was noted.   STOMACH: Mild non-erosive gastritis (inflammation) was found in the gastric antrum.  Multiple biopsies were performed using cold forceps.   Multiple small sessile polyps were found in the gastric fundus and gastric body.  Multiple biopsies was performed using cold forceps.   DUODENUM: Moderate duodenal inflammation was found in the bulb and second portion of the duodenum.   Multiple small ulcers were found in the duodenal bulb.  Biopsies were taken around the ulcers. COMPLICATIONS:   None  ENDOSCOPIC IMPRESSION: 1.   Stricture at the  gastroesophageal junction 2.   Medium sized hiatal hernia 3.   MILD Non-erosive gastritis in the gastric antrum 4.   Multiple GASTRIC polyps 5.   MODERATE DUODENITIS/Multiple ulcers in the duodenal bulb 6.   ANEMIA DUE TO GI BLOOD LOSS/CHRONIC RENAL INSUFFICINECY/POOR PO INTAKE, POSSIBLY ATROPHIC GASTRITIS  RECOMMENDATIONS: DAILY PPI DYSPHAGIA 3 DIET AWAIT BIOPSY AGILE CAPSULE STUDY JUL 10   REPEAT EXAM:  _______________________________ Lorrin MaisBarney Drain, MD 02/26/2014 2:07 PM

## 2014-02-27 ENCOUNTER — Encounter (HOSPITAL_COMMUNITY)
Admission: EM | Disposition: A | Discharge status: Nursing Home (Includes ICF) | Payer: Self-pay | Source: Home / Self Care | Attending: Family Medicine

## 2014-02-27 HISTORY — PX: AGILE CAPSULE: SHX5420

## 2014-02-27 LAB — CBC
HCT: 30.4 % — ABNORMAL LOW (ref 36.0–46.0)
Hemoglobin: 9.2 g/dL — ABNORMAL LOW (ref 12.0–15.0)
MCH: 21.1 pg — AB (ref 26.0–34.0)
MCHC: 30.3 g/dL (ref 30.0–36.0)
MCV: 69.9 fL — ABNORMAL LOW (ref 78.0–100.0)
Platelets: 253 10*3/uL (ref 150–400)
RBC: 4.35 MIL/uL (ref 3.87–5.11)
RDW: 22.5 % — ABNORMAL HIGH (ref 11.5–15.5)
WBC: 6.5 10*3/uL (ref 4.0–10.5)

## 2014-02-27 SURGERY — AGILE CAPSULE

## 2014-02-27 MED ORDER — PANTOPRAZOLE SODIUM 40 MG PO TBEC: 40.0000 mg | DELAYED_RELEASE_TABLET | Freq: Every day | ORAL | Status: DC

## 2014-02-27 MED ORDER — LEVOFLOXACIN IN D5W 500 MG/100ML IV SOLN
500.0000 mg | INTRAVENOUS | Status: DC
  Filled 2014-02-27 (×2): qty 100

## 2014-02-27 NOTE — Progress Notes (Signed)
Has refused anything PO this shift. Swings at staff and spits. Scratched this RN while trying to place oxygen on. Patient has pulled out IV, removed telemetry, and removed clothing. Attempts to orient are futile. Patient screams and scratches. Mitts placed on after she removed them.

## 2014-02-27 NOTE — Discharge Summary (Signed)
Physician Discharge Summary  Martha Pearson CNO:[id number] DOB: [birth year] DOA: 02/23/2014  PCP: Delphina Cahill, MD  Admit date: 02/23/2014 Discharge date: 02/27/2014  Time spent: 35 minutes  Recommendations for Outpatient Follow-up:  1. Recommend goals of care as an outpatient has end-stage dementia with poor outcome  2. Consider repeat CBC in a week 3. Recommend at a nursing facility and would not aggressively control chronic medical issues  4. Reconsider bowel regimen as it is on both Imodium and laxative 5. Is being discharged 2 [facility]  Discharge Diagnoses:  Active Problems:   Dementia   CKD (chronic kidney disease) stage 4, GFR 15-29 ml/min   Acute on chronic diastolic CHF (congestive heart failure)   Anemia   Sepsis   UTI (lower urinary tract infection)   Discharge Condition: Guarded  Diet recommendation: Regular  Filed Weights   02/23/14 2335 02/24/14 0857  Weight: 52 kg (114 lb 10.2 oz) 48.6 kg (107 lb 2.3 oz)    History of present illness:   80 yo ? admitted 09/29/45 c toxic metabolic encephalopathy, abla with hb of 5.6, hypothermia requiring bair hugger. Ua showed + nitrites, wbc, bacteria. CXR=bilateral opacities and effusions  Transfused 2 u prbc and GI consulted.  Family opted for egd to determine source of bleed as colonoscopy 2012 showed diverticular bleed with tubular adenoma capsule study was pursued the patient is noncompliant-see below further and check    Hospital Course:   1. Sepsis-pyelonephritis. Initially was on on levofloxacin. Followup urine culture shows only 15,000 CFU mixed colonies so far. She was hypothermic on admission but this appears to be improving. Switched to oral Levaquin and completed 4 days of antibiotics on 02/26/14 2. Acute on chronic diastolic congestive heart failure. Not currently requiring a Ventimask. Continue Lasix po 20 mg daily as of 02/26/14  3. Anemia with heme positive stools. Hemoglobin 5.1 on admission. She received 2  units of PRBCs-Hb 8.9. Continue to monitor. Gastroenterology performed an endoscopy 02/26/14 = atrophic gastritis?-Await biopsies which will be followed up as an outpatient. Capsule study was attempted however patient was not able to comply and spelled her medication because of her dementia and this was aborted--I discussed this clearly with family and have recommended a course of care as an outpatient--she will continue pantoprazole 40 daily  4. hypertension continue amlodipine 10 mg daily, Coreg 6.25 2 times a day-these were initially held and has she had a GI bleed  5. Dementia. Advanced dementia, patient is totally bed bound. Continue when necessary Ativan for agitation. Continue Depakote 125 each bedtime Continue sertraline 25 mg 6. Constipation-note that she is on both Imodium as well as MiraLax and both were discontinued in the hospital--she may need bowel regimen if constipated in the outpatient setting 7. Hypothyroidism-continue Synthroid 88 mcg 8. Chronic kidney disease stage IV. Creatinine is currently at baseline-as she does not seem volume overloaded we will cut back her Lasix 20 daily  Consultants:  Gastroenterology  Procedures:  Endoscopy 7/9 =  ENDOSCOPIC IMPRESSION:  1. Stricture at the gastroesophageal junction  2. Medium sized hiatal hernia  3. MILD Non-erosive gastritis in the gastric antrum  4. Multiple GASTRIC polyps  5. MODERATE DUODENITIS/Multiple ulcers in the duodenal bulb  6. ANEMIA DUE TO GI BLOOD LOSS/CHRONIC RENAL INSUFFICINECY/POOR PO  INTAKE, POSSIBLY ATROPHIC GASTRITIS  Antibiotics:  Levaquin 7/6-02/26/14   Discharge Exam: Filed Vitals:   02/27/14 0443  BP: 185/64  Pulse: 67  Temp: 97.4 F (36.3 C)  Resp: 18  General: Alert but slightly combative  Cardiovascular: S1-S2 no murmur rub or gallop  Respiratory: Clinically clear  Discharge Instructions You were cared for by a hospitalist during your hospital stay. If you have any questions about your  discharge medications or the care you received while you were in the hospital after you are discharged, you can call the unit and asked to speak with the hospitalist on call if the hospitalist that took care of you is not available. Once you are discharged, your primary care physician will handle any further medical issues. Please note that NO REFILLS for any discharge medications will be authorized once you are discharged, as it is imperative that you return to your primary care physician (or establish a relationship with a primary care physician if you do not have one) for your aftercare needs so that they can reassess your need for medications and monitor your lab values.  Discharge Instructions   Diet - low sodium heart healthy    Complete by:  As directed      Discharge instructions    Complete by:  As directed      Increase activity slowly    Complete by:  As directed             Medication List         acetaminophen 325 MG tablet  Commonly known as:  TYLENOL  Take 2 tablets (650 mg total) by mouth every 6 (six) hours as needed for fever.     amLODipine 10 MG tablet  Commonly known as:  NORVASC  Take 10 mg by mouth daily.     carvedilol 6.25 MG tablet  Commonly known as:  COREG  Take 1 tablet (6.25 mg total) by mouth 2 (two) times daily with a meal.     cholecalciferol 1000 UNITS tablet  Commonly known as:  VITAMIN D  Take 1,000 Units by mouth 2 (two) times daily.     divalproex 125 MG DR tablet  Commonly known as:  DEPAKOTE  Take 125 mg by mouth at bedtime.     ENSURE  Take 237 mLs by mouth 2 (two) times daily.     furosemide 20 MG tablet  Commonly known as:  LASIX  Take 20 mg by mouth daily. Weight gain of 3 pounds in 24 hours     guaiFENesin-dextromethorphan 100-10 MG/5ML syrup  Commonly known as:  ROBITUSSIN DM  Take 5 mLs by mouth 3 (three) times daily as needed for cough.     levothyroxine 88 MCG tablet  Commonly known as:  SYNTHROID, LEVOTHROID  Take 88  mcg by mouth daily.     loperamide 2 MG capsule  Commonly known as:  IMODIUM  Take 2 mg by mouth 4 (four) times daily as needed for diarrhea or loose stools.     LORazepam 0.5 MG tablet  Commonly known as:  ATIVAN  Take 0.25 mg by mouth every 4 (four) hours as needed for anxiety.     pantoprazole 40 MG tablet  Commonly known as:  PROTONIX  Take 1 tablet (40 mg total) by mouth daily before breakfast.     polyethylene glycol packet  Commonly known as:  MIRALAX / GLYCOLAX  Take 17 g by mouth daily. constipation     promethazine 25 MG tablet  Commonly known as:  PHENERGAN  Take 25 mg by mouth every 6 (six) hours as needed for nausea.     QC ALL DAY ALLERGY 10 MG tablet  Generic drug:  cetirizine  Take 5 mg by mouth every 6 (six) hours as needed (for itching).     sertraline 25 MG tablet  Commonly known as:  ZOLOFT  Take 25 mg by mouth daily.     zinc oxide 20 % ointment  Apply 1 application topically 3 (three) times daily as needed. *One application applied to affected area(s) of buttocks three times daily as needed for redness       Allergies  Allergen Reactions  . Ace Inhibitors Other (See Comments)    unknown  . Penicillins Other (See Comments)    unknown  . Sulfa Antibiotics Other (See Comments)    unknown      The results of significant diagnostics from this hospitalization (including imaging, microbiology, ancillary and laboratory) are listed below for reference.    Significant Diagnostic Studies: Dg Chest Portable 1 View  02/23/2014   CLINICAL DATA:  Weakness, with history of hypertension.  EXAM: PORTABLE CHEST - 1 VIEW  COMPARISON:  01/12/2014.  FINDINGS: Cardiomegaly. Calcified tortuous aorta. Bilateral airspace opacities with effusions, probable pulmonary edema. Baseline chronic lung disease with interstitial prominence. Chronic deformity left humeral neck. Osteopenia. Worsening aeration from priors.  IMPRESSION: Cardiomegaly with bilateral airspace opacities  and effusions, likely CHF.   Electronically Signed   By: Rolla Flatten M.D.   On: 02/23/2014 19:18    Microbiology: Recent Results (from the past 240 hour(s))  URINE CULTURE     Status: None   Collection Time    02/24/14  2:15 AM      Result Value Ref Range Status   Specimen Description URINE, CATHETERIZED   Final   Special Requests NONE   Final   Culture  Setup Time     Final   Value: 02/24/2014 14:22     Performed at [facility]     Final   Value: 15,000 COLONIES/ML     Performed at [facility]   Culture     Final   Value: Multiple bacterial morphotypes present, none predominant. Suggest appropriate recollection if clinically indicated.     Performed at [facility]   Report Status 02/25/2014 FINAL   Final     Labs: Basic Metabolic Panel:  Recent Labs Lab 02/23/14 1745 02/24/14 0439 02/25/14 0811  NA 140 144 148*  K 4.9 4.2 3.7  CL 105 106 111  CO2 22 22 20   GLUCOSE 96 93 84  BUN 75* 72* 58*  CREATININE 2.49* 2.50* 2.56*  CALCIUM 9.0 9.1 8.9   Liver Function Tests:  Recent Labs Lab 02/24/14 0439 02/25/14 0811  AST 19 17  ALT 16 15  ALKPHOS 121* 117  BILITOT 1.4* 0.4  PROT 6.8 6.2  ALBUMIN 2.9* 2.8*   No results found for this basename: LIPASE, AMYLASE,  in the last 168 hours No results found for this basename: AMMONIA,  in the last 168 hours CBC:  Recent Labs Lab 02/23/14 1745 02/24/14 0439  02/25/14 0811 02/25/14 0812 02/25/14 1749 02/26/14 0017 02/26/14 0741 02/27/14 0626  WBC 4.8 6.6  --  5.8  --   --   --   --  6.5  NEUTROABS 3.3  --   --   --   --   --   --   --   --   HGB 5.1* 8.7*  < > 8.9* 8.9* 9.3* 8.5* 9.2* 9.2*  HCT 18.0* 27.7*  < > 29.2* 29.3* 30.7* 27.5* 30.0* 30.4*  MCV 61.9*  67.1*  --  67.6*  --   --   --   --  69.9*  PLT 279 296  --  276  --   --   --   --  253  < > = values in this interval not displayed. Cardiac Enzymes: No results found for this basename: CKTOTAL, CKMB, CKMBINDEX,  TROPONINI,  in the last 168 hours BNP: BNP (last 3 results)  Recent Labs  02/23/14 1935  PROBNP 6266.0*   CBG: No results found for this basename: GLUCAP,  in the last 168 hours     Signed:  Nita Sells  [facility] 02/27/2014, 10:41 AM

## 2014-02-27 NOTE — Progress Notes (Signed)
  Subjective: Appears comfortable. Alert. Otherwise can not provide history.  Objective: Vital signs in last 24 hours: Temp:  [97.3 F (36.3 C)-97.8 F (36.6 C)] 97.4 F (36.3 C) (07/10 0443) Pulse Rate:  [25-74] 67 (07/10 0443) Resp:  [2-20] 18 (07/10 0443) BP: (129-185)/(54-107) 185/64 mmHg (07/10 0443) SpO2:  [91 %-100 %] 96 % (07/10 0900) Last BM Date:  (unknown) General:   Alert,  Elderly in NAD Head:  Normocephalic and atraumatic. Eyes:  Sclera pale, no icterus.   Abdomen:  Soft, nontender and nondistended.   Normal bowel sounds, without guarding, and without rebound.   Extremities:  Without clubbing, deformity or edema. Neurologic:  Alert  Skin:  Intact without significant lesions or rashes. Psych:  Alert and confused.  Intake/Output from previous day: 07/09 0701 - 07/10 0700 In: 0  Out: 800 [Urine:800] Intake/Output this shift: Total I/O In: 0  Out: 50 [Urine:50]  Lab Results: CBC  Recent Labs  02/25/14 0811  02/26/14 0017 02/26/14 0741 02/27/14 0626  WBC 5.8  --   --   --  6.5  HGB 8.9*  < > 8.5* 9.2* 9.2*  HCT 29.2*  < > 27.5* 30.0* 30.4*  MCV 67.6*  --   --   --  69.9*  PLT 276  --   --   --  253  < > = values in this interval not displayed. BMET  Recent Labs  02/25/14 0811  NA 148*  K 3.7  CL 111  CO2 20  GLUCOSE 84  BUN 58*  CREATININE 2.56*  CALCIUM 8.9   LFTs  Recent Labs  02/25/14 0811  BILITOT 0.4  BILIDIR <0.2  IBILI NOT CALCULATED  ALKPHOS 117  AST 17  ALT 15  PROT 6.2  ALBUMIN 2.8*   No results found for this basename: LIPASE,  in the last 72 hours PT/INR No results found for this basename: LABPROT, INR,  in the last 72 hours    Imaging Studies: Dg Chest Portable 1 View  02/23/2014   CLINICAL DATA:  Weakness, with history of hypertension.  EXAM: PORTABLE CHEST - 1 VIEW  COMPARISON:  01/12/2014.  FINDINGS: Cardiomegaly. Calcified tortuous aorta. Bilateral airspace opacities with effusions, probable pulmonary edema.  Baseline chronic lung disease with interstitial prominence. Chronic deformity left humeral neck. Osteopenia. Worsening aeration from priors.  IMPRESSION: Cardiomegaly with bilateral airspace opacities and effusions, likely CHF.   Electronically Signed   By: Rolla Flatten M.D.   On: 02/23/2014 19:18  [2 weeks]   Assessment:  80 year old female admitted with profound anemia, heme positive stool, but no overt signs of GI bleeding. Unable to gather complete history due to dementia, with majority of information from nursing staff and prior epic documentation.  Improvement in Hgb s/p 2 units PRBCs. Last colonoscopy in 2012 with diverticula, tubular adenoma.  No known presence of NSAIDs routinely.   EGD on 02/26/2014 showed a stricture at the GE junction, medium-sized hiatal hernia, mild nonerosive gastritis, multiple gastric polyps, moderate duodenal/multiple ulcers in the duodenal bulb. Anemia due to GI blood loss, chronic renal insufficiency, poor oral intake, possibly atrophic gastritis.  Patient was unable to swallow Agile (Dummy) capsule therefore givens small bowel capsule cannot be performed.  Plan: 1. Await biopsy. 2. Daily PPI. 3. Monitor H/H.   LOS: 4 days   Neil Crouch  02/27/2014, 12:49 PM

## 2014-02-27 NOTE — Progress Notes (Signed)
Attempted to administer Agile pill to patient.  Patient is very confused and refuses to swallow. Begans to cough after pill is placed in her mouth.  Patient spit the pill back out and refused to take it. Procedure cancelled

## 2014-02-27 NOTE — Clinical Social Work Note (Signed)
Pt d/c today back to [facility]. Pt's guardian, Vaughan Basta and facility aware and agreeable. Notified Whitney at [facility] that pt has been agitated/aggressive which Loree Fee reports is baseline. D/C summary and FL2 faxed. Facility to provide transport.   Benay Pike, [place]

## 2014-02-27 NOTE — Progress Notes (Signed)
UR chart review completed.  

## 2014-02-27 NOTE — Progress Notes (Signed)
ANTIBIOTIC CONSULT NOTE - follow up  Pharmacy Consult for Levaquin Indication: UTI  Allergies  Allergen Reactions  . Ace Inhibitors Other (See Comments)    unknown  . Penicillins Other (See Comments)    unknown  . Sulfa Antibiotics Other (See Comments)    unknown   Patient Measurements: Height: 5\' 2"  (157.5 cm) Weight: 107 lb 2.3 oz (48.6 kg) IBW/kg (Calculated) : 50.1  Vital Signs: Temp: 97.4 F (36.3 C) (07/10 0443) Temp src: Axillary (07/10 0443) BP: 185/64 mmHg (07/10 0443) Pulse Rate: 67 (07/10 0443) Intake/Output from previous day: 07/09 0701 - 07/10 0700 In: 0  Out: 800 [Urine:800] Intake/Output from this shift: Total I/O In: 0  Out: 50 [Urine:50]  Labs:  Recent Labs  02/25/14 0811  02/26/14 0017 02/26/14 0741 02/27/14 0626  WBC 5.8  --   --   --  6.5  HGB 8.9*  < > 8.5* 9.2* 9.2*  PLT 276  --   --   --  253  CREATININE 2.56*  --   --   --   --   < > = values in this interval not displayed.  Microbiology: Recent Results (from the past 720 hour(s))  URINE CULTURE     Status: None   Collection Time    02/24/14  2:15 AM      Result Value Ref Range Status   Specimen Description URINE, CATHETERIZED   Final   Special Requests NONE   Final   Culture  Setup Time     Final   Value: 02/24/2014 14:22     Performed at [facility]     Final   Value: 15,000 COLONIES/ML     Performed at [facility]   Culture     Final   Value: Multiple bacterial morphotypes present, none predominant. Suggest appropriate recollection if clinically indicated.     Performed at [facility]   Report Status 02/25/2014 FINAL   Final   Medical History: Past Medical History  Diagnosis Date  . Hypertension   . Thyroid disease     hypothyroid  . Cancer     breast  . Hypothyroid   . Alzheimer disease   . Osteoporosis   . Hip fracture   . LVH (left ventricular hypertrophy)   . Acute on chronic diastolic CHF (congestive heart failure)  07/09/2012.  Marland Kitchen CKD (chronic kidney disease) stage 4, GFR 15-29 ml/min 09/23/2011  . Dementia    Medications:  Scheduled:  . carvedilol  6.25 mg Oral BID WC  . divalproex  125 mg Oral QHS  . furosemide  20 mg Oral Daily  . levofloxacin  500 mg Oral Q48H  . levothyroxine  88 mcg Oral QAC breakfast  . pantoprazole  40 mg Oral QAC breakfast  . polyethylene glycol  17 g Oral BID  . sertraline  25 mg Oral Daily  . sodium chloride  3 mL Intravenous Q12H  . sodium chloride  3 mL Intravenous Q12H   Assessment: 80yo female started on Levaquin for UTI.  Pt is afebrile.   Pt has CKD.  Estimated Creatinine Clearance: 10.5 ml/min (by C-G formula based on Cr of 2.56).   Urine culture noncontributory.  Pt is reportedly refusing to take PO medications, spitting out PO meds and food and being very combative.  Goal of Therapy:  Eradicate infection  Plan:   Levaquin 500 mg IV every 48 hours  Monitor renal function, labs, progress and cultures  Switch Levaquin to PO when / if pt willing to take PO meds  Hart Robinsons A 02/27/2014,10:42 AM

## 2014-02-27 NOTE — Progress Notes (Signed)
Pt swinging at staff.  Pt spitting out medication and spitting food at staff.  Pt grinding teeth and being very aggressive with staff.  Nursing student, NT, and RN attempted several times to get patient to eat and take medications.  Pt scratches staff and herself when attempting to do anything with her.  Pulling IV lines, will not keep on O2, and pulling off mits and telemetry.

## 2014-02-27 NOTE — Progress Notes (Signed)
REVIEWED. AGREE. COULD CONSIDER EGD/GIVENS PLACEMENT IF NO SOURCE FOR ANEMIA IDENTIFIED ON BIOPSY.

## 2014-03-02 ENCOUNTER — Encounter (HOSPITAL_COMMUNITY): Payer: Self-pay | Admitting: Gastroenterology

## 2014-03-12 ENCOUNTER — Telehealth: Payer: Self-pay | Admitting: Gastroenterology

## 2014-03-12 MED ORDER — FUSION PLUS PO CAPS: ORAL_CAPSULE | ORAL | Status: AC

## 2014-03-12 NOTE — Telephone Encounter (Signed)
Please call pt'S FAMILY. HER stomach Bx shows gastritis/DUODENITIS. THEY DO NOT USUALLY CAUSE LOW BLOOD/LOW IRON.  NO SOURCE FOR HER LOW BLOOD/LOW IRON WAS IDENTIFIED. SHE SHOULD SPEAK WITH DR. Gala Romney IF SHE WANTS TO CONTINUE A WORKUP. OTHERWISE MS. [place] SHE SHOULD START ORAL IRON DAILY AND Continue PROTONIX DAILY. AVOID ASPIRIN, BLOOD THINNERS, AND NSAIDS.

## 2014-03-13 NOTE — Telephone Encounter (Signed)
I do not see where I have ever seen this lady in epic;  She probably just needs an office visit with extender in the 1-2 months to reassess her GI status.

## 2014-03-13 NOTE — Telephone Encounter (Signed)
I called [facility] and spoke to Sadler and informed of results and recommendations. Faxed the note and the prescription to her at [phone]. I also called and informed the pt's legal guardian, Marchia Meiers.

## 2014-03-15 ENCOUNTER — Emergency Department (HOSPITAL_COMMUNITY): Payer: Medicare Other

## 2014-03-15 ENCOUNTER — Encounter (HOSPITAL_COMMUNITY): Payer: Self-pay | Admitting: Emergency Medicine

## 2014-03-15 ENCOUNTER — Emergency Department (HOSPITAL_COMMUNITY)
Admission: EM | Admit: 2014-03-15 | Discharge: 2014-03-16 | Disposition: A | Discharge status: Home / Self Care | Payer: Medicare Other | Attending: Emergency Medicine | Admitting: Emergency Medicine

## 2014-03-15 DIAGNOSIS — S42033A Displaced fracture of lateral end of unspecified clavicle, initial encounter for closed fracture: Secondary | ICD-10-CM | POA: Insufficient documentation

## 2014-03-15 DIAGNOSIS — F0390 Unspecified dementia without behavioral disturbance: Secondary | ICD-10-CM | POA: Insufficient documentation

## 2014-03-15 DIAGNOSIS — M810 Age-related osteoporosis without current pathological fracture: Secondary | ICD-10-CM | POA: Diagnosis not present

## 2014-03-15 DIAGNOSIS — S0100XA Unspecified open wound of scalp, initial encounter: Secondary | ICD-10-CM | POA: Insufficient documentation

## 2014-03-15 DIAGNOSIS — G309 Alzheimer's disease, unspecified: Secondary | ICD-10-CM | POA: Insufficient documentation

## 2014-03-15 DIAGNOSIS — Z853 Personal history of malignant neoplasm of breast: Secondary | ICD-10-CM | POA: Insufficient documentation

## 2014-03-15 DIAGNOSIS — F0280 Dementia in other diseases classified elsewhere without behavioral disturbance: Secondary | ICD-10-CM | POA: Insufficient documentation

## 2014-03-15 DIAGNOSIS — Z79899 Other long term (current) drug therapy: Secondary | ICD-10-CM | POA: Insufficient documentation

## 2014-03-15 DIAGNOSIS — Y9389 Activity, other specified: Secondary | ICD-10-CM | POA: Insufficient documentation

## 2014-03-15 DIAGNOSIS — Z880 Allergy status to penicillin: Secondary | ICD-10-CM | POA: Diagnosis not present

## 2014-03-15 DIAGNOSIS — Z8781 Personal history of (healed) traumatic fracture: Secondary | ICD-10-CM | POA: Diagnosis not present

## 2014-03-15 DIAGNOSIS — I517 Cardiomegaly: Secondary | ICD-10-CM | POA: Insufficient documentation

## 2014-03-15 DIAGNOSIS — W1811XA Fall from or off toilet without subsequent striking against object, initial encounter: Secondary | ICD-10-CM | POA: Insufficient documentation

## 2014-03-15 DIAGNOSIS — I5033 Acute on chronic diastolic (congestive) heart failure: Secondary | ICD-10-CM | POA: Insufficient documentation

## 2014-03-15 DIAGNOSIS — S42001A Fracture of unspecified part of right clavicle, initial encounter for closed fracture: Secondary | ICD-10-CM

## 2014-03-15 DIAGNOSIS — I129 Hypertensive chronic kidney disease with stage 1 through stage 4 chronic kidney disease, or unspecified chronic kidney disease: Secondary | ICD-10-CM | POA: Diagnosis not present

## 2014-03-15 DIAGNOSIS — S0101XA Laceration without foreign body of scalp, initial encounter: Secondary | ICD-10-CM

## 2014-03-15 DIAGNOSIS — Y929 Unspecified place or not applicable: Secondary | ICD-10-CM | POA: Insufficient documentation

## 2014-03-15 DIAGNOSIS — E039 Hypothyroidism, unspecified: Secondary | ICD-10-CM | POA: Insufficient documentation

## 2014-03-15 DIAGNOSIS — N184 Chronic kidney disease, stage 4 (severe): Secondary | ICD-10-CM | POA: Diagnosis not present

## 2014-03-15 DIAGNOSIS — W19XXXA Unspecified fall, initial encounter: Secondary | ICD-10-CM

## 2014-03-15 NOTE — ED Notes (Signed)
Pt on alzheimer's unit at [facility].  Pt was assisted and left on toilet and when staff came back pt was laying in floor. Laceration to head.  cbg 180.

## 2014-03-15 NOTE — ED Notes (Signed)
Family states that she is moaning and groaning, states that they believe she is in pain. MD notified.

## 2014-03-15 NOTE — ED Provider Notes (Addendum)
CSN: [id number]     Arrival date & time 03/15/14  9480 History  This chart was scribed for [name], MD by Jeanell Sparrow, ED Scribe. This patient was seen in room APA03/APA03 and the patient's care was started at 7:32 PM.    Chief Complaint  Patient presents with  . Fall   Level 5 Caveat due to Dementia   HPI HPI Comments: Martha Pearson is a 80 y.o. female who presents to the Emergency Department complaining of a fall. Relative states that pt fell off the toilet and hit her head on the floor. Patient also complains of pain around the right shoulder area. Laceration on right parietal scalp. No hip tenderness. Caregiver reports severe dementia.  Past Medical History  Diagnosis Date  . Hypertension   . Thyroid disease     hypothyroid  . Cancer     breast  . Hypothyroid   . Alzheimer disease   . Osteoporosis   . Hip fracture   . LVH (left ventricular hypertrophy)   . Acute on chronic diastolic CHF (congestive heart failure) 07/09/2012.  Marland Kitchen CKD (chronic kidney disease) stage 4, GFR 15-29 ml/min 09/23/2011  . Dementia    Past Surgical History  Procedure Laterality Date  . Mastectomy      right  . Colonoscopy  Dec 2011    Dr. Gala Romney: normal rectum, pancolonic diverticula, fixed clot in sigmoid s/p hemostasis and clipping , tubular adenoma  . Agile capsule N/A 02/27/2014    Procedure: AGILE CAPSULE;  Surgeon: Danie Binder, MD;  Location: [facility];  Service: Endoscopy;  Laterality: N/A;  . Esophagogastroduodenoscopy N/A 02/26/2014    Procedure: ESOPHAGOGASTRODUODENOSCOPY (EGD);  Surgeon: Danie Binder, MD;  Location: [facility];  Service: Endoscopy;  Laterality: N/A;   Family History  Problem Relation Age of Onset  . Colon cancer Neg Hx    History  Substance Use Topics  . Smoking status: Never Smoker   . Smokeless tobacco: Never Used  . Alcohol Use: No   OB History   Grav Para Term Preterm Abortions TAB SAB Ect Mult Living                 Review of Systems  Unable  to perform ROS: Dementia   A complete 10 system review of systems was obtained and all systems are negative except as noted in the HPI and PMH.   Allergies  Ace inhibitors; Penicillins; and Sulfa antibiotics  Home Medications   Prior to Admission medications   Medication Sig Start Date End Date Taking? Authorizing Provider  amLODipine (NORVASC) 10 MG tablet Take 10 mg by mouth daily.   Yes Historical Provider, MD  carvedilol (COREG) 6.25 MG tablet Take 1 tablet (6.25 mg total) by mouth 2 (two) times daily with a meal. 07/13/12  Yes Rexene Alberts, MD  divalproex (DEPAKOTE) 125 MG DR tablet Take 125 mg by mouth at bedtime.   Yes Historical Provider, MD  furosemide (LASIX) 20 MG tablet Take 20 mg by mouth daily. Weight gain of 3 pounds in 24 hours   Yes Historical Provider, MD  Iron-FA-B Cmp-C-Biot-Probiotic (FUSION PLUS) CAPS 1 PO DAILY 03/12/14  Yes Danie Binder, MD  levothyroxine (SYNTHROID, LEVOTHROID) 100 MCG tablet Take 100 mcg by mouth daily before breakfast.   Yes Historical Provider, MD  pantoprazole (PROTONIX) 20 MG tablet Take 20 mg by mouth daily.   Yes Historical Provider, MD  sertraline (ZOLOFT) 25 MG tablet Take 25 mg by mouth daily.  Yes Historical Provider, MD  Vitamin D, Ergocalciferol, (DRISDOL) 50000 UNITS CAPS capsule Take 50,000 Units by mouth every Saturday.   Yes Historical Provider, MD  acetaminophen (TYLENOL) 325 MG tablet Take 2 tablets (650 mg total) by mouth every 6 (six) hours as needed for fever. 07/13/12   Rexene Alberts, MD  cetirizine (QC ALL DAY ALLERGY) 10 MG tablet Take 5 mg by mouth every 6 (six) hours as needed (for itching).    Historical Provider, MD  guaiFENesin-dextromethorphan (ROBITUSSIN DM) 100-10 MG/5ML syrup Take 5 mLs by mouth 3 (three) times daily as needed for cough.    Historical Provider, MD  LORazepam (ATIVAN) 0.5 MG tablet Take 0.25 mg by mouth every 4 (four) hours as needed for anxiety.     Historical Provider, MD  promethazine  (PHENERGAN) 25 MG tablet Take 25 mg by mouth every 6 (six) hours as needed for nausea.    Historical Provider, MD  zinc oxide 20 % ointment Apply 1 application topically 3 (three) times daily as needed. *One application applied to affected area(s) of buttocks three times daily as needed for redness    Historical Provider, MD   BP 169/108  Pulse 53  Resp 14  Wt 105 lb (47.628 kg)  SpO2 98% Physical Exam  Nursing note and vitals reviewed. Constitutional: She is oriented to person, place, and time.  Demented  HENT:  Head: Normocephalic and atraumatic.  Laceration on right parietal scalp   Eyes: Conjunctivae and EOM are normal. Pupils are equal, round, and reactive to light.  Neck: Normal range of motion. Neck supple.  Cardiovascular: Normal rate, regular rhythm and normal heart sounds.   Pulmonary/Chest: Effort normal and breath sounds normal.  Abdominal: Soft. Bowel sounds are normal.  Musculoskeletal:  Tender distal right clavicle  Neurological: She is alert and oriented to person, place, and time.  Skin:  Abrasions on right shoulder and right elbow  Psychiatric: She has a normal mood and affect. Her behavior is normal.    ED Course  LACERATION REPAIR Date/Time: 03/15/2014 11:25 PM Performed by: [name] Authorized by: [name] Comments: Laceration repair of right parietal scalp laceration at approximately 2100.  Laceration is 2.5 cm in length. Wound was cleaned with normal saline. Hair was trimmed. Staples x3. Patient tolerated procedure well   (includin Patientg criticahasl care time) DIAGNOSTIC STUDIES: Oxygen Saturation is 98% on RA, normal by my interpretation.    COORDINATION OF CARE: 7:36 PM- Pt advised of plan for treatment and pt agrees.  Labs Review Labs Reviewed - No data to display  Imaging Review Dg Shoulder Right  03/15/2014   CLINICAL DATA:  Right shoulder pain.  EXAM: RIGHT SHOULDER - 2+ VIEW  COMPARISON:  None.  FINDINGS: Comminuted fractures of the  distal right clavicle with inferior displacement and overriding of the distal fracture fragments. Visualization of the right shoulder is limited due to non standard views. No definite humeral fracture although glenohumeral dislocation cannot be excluded due to poor visualization.  IMPRESSION: Displaced fracture of the distal right clavicle. Limited visualization of the shoulder joint.   Electronically Signed   By: Lucienne Capers M.D.   On: 03/15/2014 22:25   Ct Head Wo Contrast  03/15/2014   CLINICAL DATA:  Patient fell from toilet onto floor hitting head with right side lacerations stable  EXAM: CT HEAD WITHOUT CONTRAST  CT CERVICAL SPINE WITHOUT CONTRAST  TECHNIQUE: Multidetector CT imaging of the head and cervical spine was performed following the standard protocol without intravenous  contrast. Multiplanar CT image reconstructions of the cervical spine were also generated.  COMPARISON:  10/27/2013  FINDINGS: CT HEAD FINDINGS  Severe atrophy with stable proportional dilatation of the ventricles. Chronic lacunar infarcts left basal ganglia, stable. Study limited by motion artifact, but no evidence of acute hemorrhage or extra-axial fluid. No evidence of acute infarct. Complete opacification of the visualized portion of the maxillary sinus, stable from prior study, as is wall thickening and sclerosis of the maxillary sinus and involvement of ethmoid air cells on the right. Right scalp laceration noted.  CT CERVICAL SPINE FINDINGS  There is normal alignment with no prevertebral soft tissue swelling. There is no fracture. There is moderate spondylosis throughout the cervical spine. There are areas postsurgical change at C1 and C2 with evidence of posterior fusion. There are numerous pulmonary nodules seen visualized portion of the lung apices bilaterally, all measuring less than or equal to 5 mm. These are not seen on the prior study, although less volume of the lung apices was included at that time.  IMPRESSION:  No acute intracranial abnormality. No acute abnormality involving cervical spine.  Multiple lung nodules seen in the apices. Given history of breast cancer, this is concerning for metastasis. Formal evaluation of the thorax with CT scan suggested.   Electronically Signed   By: Skipper Cliche M.D.   On: 03/15/2014 22:16   Ct Cervical Spine Wo Contrast  03/15/2014   CLINICAL DATA:  Patient fell from toilet onto floor hitting head with right side lacerations stable  EXAM: CT HEAD WITHOUT CONTRAST  CT CERVICAL SPINE WITHOUT CONTRAST  TECHNIQUE: Multidetector CT imaging of the head and cervical spine was performed following the standard protocol without intravenous contrast. Multiplanar CT image reconstructions of the cervical spine were also generated.  COMPARISON:  10/27/2013  FINDINGS: CT HEAD FINDINGS  Severe atrophy with stable proportional dilatation of the ventricles. Chronic lacunar infarcts left basal ganglia, stable. Study limited by motion artifact, but no evidence of acute hemorrhage or extra-axial fluid. No evidence of acute infarct. Complete opacification of the visualized portion of the maxillary sinus, stable from prior study, as is wall thickening and sclerosis of the maxillary sinus and involvement of ethmoid air cells on the right. Right scalp laceration noted.  CT CERVICAL SPINE FINDINGS  There is normal alignment with no prevertebral soft tissue swelling. There is no fracture. There is moderate spondylosis throughout the cervical spine. There are areas postsurgical change at C1 and C2 with evidence of posterior fusion. There are numerous pulmonary nodules seen visualized portion of the lung apices bilaterally, all measuring less than or equal to 5 mm. These are not seen on the prior study, although less volume of the lung apices was included at that time.  IMPRESSION: No acute intracranial abnormality. No acute abnormality involving cervical spine.  Multiple lung nodules seen in the apices. Given  history of breast cancer, this is concerning for metastasis. Formal evaluation of the thorax with CT scan suggested.   Electronically Signed   By: Skipper Cliche M.D.   On: 03/15/2014 22:16     EKG Interpretation None      MDM   Final diagnoses:  Fall, initial encounter  Laceration of scalp, initial encounter  Fracture of right clavicle, closed, initial encounter   Patient has profound dementia.  Scalp lac repaired.  Right clavicle fx txed with sling.  Abnormal lung nodules disc c power of atty.  No aggressive tx.     I personally performed the services  described in this documentation, which was scribed in my presence. The recorded information has been reviewed and is accurate.    [name], MD 03/15/14 [id number]  [name], MD 03/16/14 0005

## 2014-03-15 NOTE — Discharge Instructions (Signed)
Staples out next Monday. Sling for right clavicle fracture. Tylenol for pain. Ice pack. There is also evidence of potential cancer in the lungs. No treatment recommended at this time.

## 2014-03-18 ENCOUNTER — Encounter: Payer: Self-pay | Admitting: Gastroenterology

## 2014-03-18 NOTE — Telephone Encounter (Signed)
Pt is aware of OV on 9/21 at 11am with [facility] and appt card mailed

## 2014-03-19 ENCOUNTER — Emergency Department (HOSPITAL_COMMUNITY): Payer: Medicare Other

## 2014-03-19 ENCOUNTER — Inpatient Hospital Stay (HOSPITAL_COMMUNITY)
Admission: EM | Admit: 2014-03-19 | Discharge: 2014-03-24 | DRG: 871 | Disposition: A | Discharge status: Nursing Home (Includes ICF) | Payer: Medicare Other | Attending: Internal Medicine | Admitting: Internal Medicine

## 2014-03-19 ENCOUNTER — Encounter (HOSPITAL_COMMUNITY): Payer: Self-pay | Admitting: Emergency Medicine

## 2014-03-19 DIAGNOSIS — M810 Age-related osteoporosis without current pathological fracture: Secondary | ICD-10-CM | POA: Diagnosis present

## 2014-03-19 DIAGNOSIS — G9341 Metabolic encephalopathy: Secondary | ICD-10-CM | POA: Diagnosis present

## 2014-03-19 DIAGNOSIS — I509 Heart failure, unspecified: Secondary | ICD-10-CM | POA: Diagnosis present

## 2014-03-19 DIAGNOSIS — R4182 Altered mental status, unspecified: Secondary | ICD-10-CM

## 2014-03-19 DIAGNOSIS — E870 Hyperosmolality and hypernatremia: Secondary | ICD-10-CM | POA: Diagnosis present

## 2014-03-19 DIAGNOSIS — N390 Urinary tract infection, site not specified: Secondary | ICD-10-CM | POA: Diagnosis present

## 2014-03-19 DIAGNOSIS — F0391 Unspecified dementia with behavioral disturbance: Secondary | ICD-10-CM

## 2014-03-19 DIAGNOSIS — I5033 Acute on chronic diastolic (congestive) heart failure: Secondary | ICD-10-CM

## 2014-03-19 DIAGNOSIS — I5032 Chronic diastolic (congestive) heart failure: Secondary | ICD-10-CM | POA: Diagnosis present

## 2014-03-19 DIAGNOSIS — Z66 Do not resuscitate: Secondary | ICD-10-CM | POA: Diagnosis present

## 2014-03-19 DIAGNOSIS — D61818 Other pancytopenia: Secondary | ICD-10-CM | POA: Diagnosis present

## 2014-03-19 DIAGNOSIS — F0280 Dementia in other diseases classified elsewhere without behavioral disturbance: Secondary | ICD-10-CM | POA: Diagnosis present

## 2014-03-19 DIAGNOSIS — I129 Hypertensive chronic kidney disease with stage 1 through stage 4 chronic kidney disease, or unspecified chronic kidney disease: Secondary | ICD-10-CM | POA: Diagnosis present

## 2014-03-19 DIAGNOSIS — D649 Anemia, unspecified: Secondary | ICD-10-CM | POA: Diagnosis present

## 2014-03-19 DIAGNOSIS — Z515 Encounter for palliative care: Secondary | ICD-10-CM

## 2014-03-19 DIAGNOSIS — A419 Sepsis, unspecified organism: Principal | ICD-10-CM | POA: Diagnosis present

## 2014-03-19 DIAGNOSIS — Z853 Personal history of malignant neoplasm of breast: Secondary | ICD-10-CM | POA: Diagnosis not present

## 2014-03-19 DIAGNOSIS — E039 Hypothyroidism, unspecified: Secondary | ICD-10-CM | POA: Diagnosis present

## 2014-03-19 DIAGNOSIS — IMO0002 Reserved for concepts with insufficient information to code with codable children: Secondary | ICD-10-CM

## 2014-03-19 DIAGNOSIS — N184 Chronic kidney disease, stage 4 (severe): Secondary | ICD-10-CM | POA: Diagnosis present

## 2014-03-19 DIAGNOSIS — F0390 Unspecified dementia without behavioral disturbance: Secondary | ICD-10-CM | POA: Diagnosis present

## 2014-03-19 DIAGNOSIS — G309 Alzheimer's disease, unspecified: Secondary | ICD-10-CM | POA: Diagnosis present

## 2014-03-19 DIAGNOSIS — E440 Moderate protein-calorie malnutrition: Secondary | ICD-10-CM | POA: Diagnosis present

## 2014-03-19 DIAGNOSIS — G9340 Encephalopathy, unspecified: Secondary | ICD-10-CM | POA: Diagnosis present

## 2014-03-19 DIAGNOSIS — I517 Cardiomegaly: Secondary | ICD-10-CM

## 2014-03-19 DIAGNOSIS — I10 Essential (primary) hypertension: Secondary | ICD-10-CM

## 2014-03-19 LAB — URINALYSIS, ROUTINE W REFLEX MICROSCOPIC
Bilirubin Urine: NEGATIVE
GLUCOSE, UA: NEGATIVE mg/dL
Hgb urine dipstick: NEGATIVE
Ketones, ur: NEGATIVE mg/dL
LEUKOCYTES UA: NEGATIVE
Nitrite: POSITIVE — AB
Specific Gravity, Urine: 1.02 (ref 1.005–1.030)
Urobilinogen, UA: 0.2 mg/dL (ref 0.0–1.0)
pH: 5.5 (ref 5.0–8.0)

## 2014-03-19 LAB — COMPREHENSIVE METABOLIC PANEL
ALT: 38 U/L — AB (ref 0–35)
AST: 35 U/L (ref 0–37)
Albumin: 2.6 g/dL — ABNORMAL LOW (ref 3.5–5.2)
Alkaline Phosphatase: 92 U/L (ref 39–117)
Anion gap: 11 (ref 5–15)
BUN: 74 mg/dL — ABNORMAL HIGH (ref 6–23)
CO2: 24 mEq/L (ref 19–32)
Calcium: 9 mg/dL (ref 8.4–10.5)
Chloride: 114 mEq/L — ABNORMAL HIGH (ref 96–112)
Creatinine, Ser: 2.5 mg/dL — ABNORMAL HIGH (ref 0.50–1.10)
GFR calc Af Amer: 18 mL/min — ABNORMAL LOW (ref >90)
GFR calc non Af Amer: 16 mL/min — ABNORMAL LOW (ref >90)
Glucose, Bld: 133 mg/dL — ABNORMAL HIGH (ref 70–99)
Potassium: 5.1 mEq/L (ref 3.7–5.3)
SODIUM: 149 meq/L — AB (ref 137–147)
TOTAL PROTEIN: 6 g/dL (ref 6.0–8.3)
Total Bilirubin: <0.2 mg/dL — ABNORMAL LOW (ref 0.3–1.2)

## 2014-03-19 LAB — PRO B NATRIURETIC PEPTIDE: Pro B Natriuretic peptide (BNP): 722.6 pg/mL — ABNORMAL HIGH (ref 0–450)

## 2014-03-19 LAB — URINE MICROSCOPIC-ADD ON

## 2014-03-19 LAB — CBC WITH DIFFERENTIAL/PLATELET
BASOS PCT: 0 % (ref 0–1)
Basophils Absolute: 0 10*3/uL (ref 0.0–0.1)
Eosinophils Absolute: 0.1 10*3/uL (ref 0.0–0.7)
Eosinophils Relative: 6 % — ABNORMAL HIGH (ref 0–5)
HCT: 27.6 % — ABNORMAL LOW (ref 36.0–46.0)
Hemoglobin: 8.1 g/dL — ABNORMAL LOW (ref 12.0–15.0)
Lymphocytes Relative: 18 % (ref 12–46)
Lymphs Abs: 0.4 10*3/uL — ABNORMAL LOW (ref 0.7–4.0)
MCH: 21.1 pg — AB (ref 26.0–34.0)
MCHC: 29.3 g/dL — ABNORMAL LOW (ref 30.0–36.0)
MCV: 72.1 fL — AB (ref 78.0–100.0)
Monocytes Absolute: 0.2 10*3/uL (ref 0.1–1.0)
Monocytes Relative: 8 % (ref 3–12)
Neutro Abs: 1.6 10*3/uL — ABNORMAL LOW (ref 1.7–7.7)
Neutrophils Relative %: 69 % (ref 43–77)
PLATELETS: 122 10*3/uL — AB (ref 150–400)
RBC: 3.83 MIL/uL — ABNORMAL LOW (ref 3.87–5.11)
RDW: 26.3 % — ABNORMAL HIGH (ref 11.5–15.5)
WBC: 2.3 10*3/uL — ABNORMAL LOW (ref 4.0–10.5)

## 2014-03-19 LAB — CBG MONITORING, ED: Glucose-Capillary: 118 mg/dL — ABNORMAL HIGH (ref 70–99)

## 2014-03-19 LAB — LACTIC ACID, PLASMA: Lactic Acid, Venous: 1.4 mmol/L (ref 0.5–2.2)

## 2014-03-19 LAB — TROPONIN I: Troponin I: <0.3 ng/mL (ref <0.30)

## 2014-03-19 MED ORDER — SODIUM CHLORIDE 0.9 % IV SOLN
Freq: Once | INTRAVENOUS | Status: AC
  Administered 2014-03-19: 23:00:00 via INTRAVENOUS

## 2014-03-19 MED ORDER — SODIUM CHLORIDE 0.9 % IV SOLN
INTRAVENOUS | Status: DC
  Administered 2014-03-20: 01:00:00 via INTRAVENOUS

## 2014-03-19 MED ORDER — LEVOTHYROXINE SODIUM 100 MCG PO TABS
100.0000 ug | ORAL_TABLET | Freq: Every day | ORAL | Status: DC
  Administered 2014-03-22 – 2014-03-23 (×2): 100 ug via ORAL
  Filled 2014-03-19 (×6): qty 1

## 2014-03-19 MED ORDER — AMLODIPINE BESYLATE 5 MG PO TABS
10.0000 mg | ORAL_TABLET | Freq: Every day | ORAL | Status: DC
  Administered 2014-03-21 – 2014-03-23 (×4): 10 mg via ORAL
  Filled 2014-03-19 (×5): qty 2

## 2014-03-19 MED ORDER — HEPARIN SODIUM (PORCINE) 5000 UNIT/ML IJ SOLN
5000.0000 [IU] | Freq: Three times a day (TID) | INTRAMUSCULAR | Status: DC
  Administered 2014-03-20 – 2014-03-23 (×10): 5000 [IU] via SUBCUTANEOUS
  Filled 2014-03-19 (×10): qty 1

## 2014-03-19 MED ORDER — LEVOFLOXACIN IN D5W 500 MG/100ML IV SOLN
500.0000 mg | Freq: Once | INTRAVENOUS | Status: AC
  Administered 2014-03-20: 500 mg via INTRAVENOUS
  Filled 2014-03-19: qty 100

## 2014-03-19 MED ORDER — POLYETHYLENE GLYCOL 3350 17 GM/SCOOP PO POWD
17.0000 g | Freq: Every day | ORAL | Status: DC
  Filled 2014-03-19: qty 255

## 2014-03-19 MED ORDER — CARVEDILOL 3.125 MG PO TABS
6.2500 mg | ORAL_TABLET | Freq: Two times a day (BID) | ORAL | Status: DC
  Administered 2014-03-21 – 2014-03-23 (×4): 6.25 mg via ORAL
  Filled 2014-03-19: qty 2
  Filled 2014-03-19 (×2): qty 1
  Filled 2014-03-19 (×3): qty 2
  Filled 2014-03-19: qty 1
  Filled 2014-03-19 (×3): qty 2

## 2014-03-19 MED ORDER — ENSURE COMPLETE PO LIQD
237.0000 mL | Freq: Two times a day (BID) | ORAL | Status: DC
  Administered 2014-03-21 – 2014-03-23 (×2): 237 mL via ORAL
  Filled 2014-03-19: qty 237

## 2014-03-19 MED ORDER — FUROSEMIDE 20 MG PO TABS
20.0000 mg | ORAL_TABLET | Freq: Every day | ORAL | Status: DC
  Filled 2014-03-19: qty 1

## 2014-03-19 NOTE — ED Notes (Signed)
Patient becoming less alert. EDP made aware. Family in room.

## 2014-03-19 NOTE — ED Notes (Signed)
Patient brought in via EMS. Patient from [facility]: Lambert Rhonda W. Patient noted at 1645 to have right side facial drooping and "slupping In wheelchair." Per EMS patient more contracted than her usual and "not fighting like she typically does:" Per EMS patient usual combative and grunts. Patient nonverbal at this time. Patient has sling right arm due to recently breaking collar bone. Per EMS patient blood sugar 159. Patient also reported to have been here at [facility] and receiving 4 units of blood 4 weeks ago.

## 2014-03-19 NOTE — H&P (Addendum)
[facility] Admission History and Physical   Patient name: Martha Pearson Medical record number: [id number] Date of birth: 28-Mar-1918 Age: 80 y.o. Gender: female  Primary Care Provider: Delphina Cahill, MD Consultants: none Code Status: DNR  Chief Complaint: AMS  Assessment and Plan: Carsen Machi is a 80 y.o. female presenting with unresponsive episode. PMH is significant for Dementia, Anemia, CHF, HTN, CKD Stage IV, breast Ca.   AMS: Likely from UTI/pyelo vs progression of dementia vs Intracranial process/stroke. No other likely sources for infection (sacral decubitus, Pneumonia, abdominal pathology). UA + (chronic colinizer vs acute illness). Hypothermia at baseline but low at presentation. Lengthy discussion had w/ family concerning end of life care/decisions. Pt is on hospice (and has been for around 1 year). Pt is combative and intermittently responsive at baseline. No further evidence of stroke or TIA and Pt not a TPA candidate. Lactic acid nml - Admit to med surge - Levaquin - f/u UCX - Palliative care consult   CKD: Cr 2.5 on admission which is baseline.  - BMET in the am  Anemia: microcytic. Likely iron deficiency vs low production from CKD. 8.1 on admission, baseline of around 9 - Consider IV iron after clearing infection - CBC in am  FEN: poor nutrition at baseline. Protein calorie malnutrition. Hypernatremia likely from hypovolemia.  - IVF NS 174ml/hr - ensure between meals.  - NPO until passes bedside swallow  HTN/CHF: normotensive on presentation. CHF diastolic grade I. Last Echo from 2013 w/ EF of 60-65%.  - continue home Coreg, norvasc, lasix  Psych: severe dementia. Symptoms on admission likely also a manifestation of disease progression.  - Hold home zoloft, and depakote.   Hypothyroidism:  Continue home synthroid  Prophylaxis: Hep [place] TID  Disposition: pending improvement vs discharge for end of life  History of Present Illness: Martha Pearson  is a 80 y.o. female presenting with AMS. Pt last normal around 12:30 during meal time. Pt was visited by medical POA at the nursing home who found the pt sitting in her chair to one side w/ her R mouth drooping open and drooling. Pt did not respond in her usual angry tones so care team was called. THere was concern for stroke and pt brought to ED.   Review Of Systems: Per HPI with the following additions: none Otherwise 12 point review of systems was performed and was unremarkable.  Patient Active Problem List   Diagnosis Date Noted  . Anemia 02/23/2014  . Sepsis 02/23/2014  . UTI (lower urinary tract infection) 02/23/2014  . Metatarsal fracture 11/11/2013  . LVH (left ventricular hypertrophy) 07/12/2012  . Acute on chronic diastolic CHF (congestive heart failure) 07/09/2012  . Encephalopathy 09/25/2011  . Urinary tract infection 09/23/2011  . Dementia 09/23/2011  . Hypothyroidism 09/23/2011  . CKD (chronic kidney disease) stage 4, GFR 15-29 ml/min 09/23/2011  . Hypertension 09/23/2011   Past Medical History: Past Medical History  Diagnosis Date  . Hypertension   . Thyroid disease     hypothyroid  . Cancer     breast  . Hypothyroid   . Alzheimer disease   . Osteoporosis   . Hip fracture   . LVH (left ventricular hypertrophy)   . Acute on chronic diastolic CHF (congestive heart failure) 07/09/2012.  Marland Kitchen CKD (chronic kidney disease) stage 4, GFR 15-29 ml/min 09/23/2011  . Dementia    Past Surgical History: Past Surgical History  Procedure Laterality Date  . Mastectomy      right  . Colonoscopy  Dec 2011    Dr. Gala Romney: normal rectum, pancolonic diverticula, fixed clot in sigmoid s/p hemostasis and clipping , tubular adenoma  . Agile capsule N/A 02/27/2014    Procedure: AGILE CAPSULE;  Surgeon: Danie Binder, MD;  Location: [facility];  Service: Endoscopy;  Laterality: N/A;  . Esophagogastroduodenoscopy N/A 02/26/2014    Procedure: ESOPHAGOGASTRODUODENOSCOPY (EGD);  Surgeon:  Danie Binder, MD;  Location: [facility];  Service: Endoscopy;  Laterality: N/A;   Social History: History  Substance Use Topics  . Smoking status: Never Smoker   . Smokeless tobacco: Never Used  . Alcohol Use: No   Additional social history: none Please also refer to relevant sections of EMR.  Family History: Family History  Problem Relation Age of Onset  . Colon cancer Neg Hx    Allergies and Medications: Allergies  Allergen Reactions  . Ace Inhibitors Other (See Comments)    unknown  . Penicillins Other (See Comments)    unknown  . Sulfa Antibiotics Other (See Comments)    unknown   No current facility-administered medications on file prior to encounter.   Current Outpatient Prescriptions on File Prior to Encounter  Medication Sig Dispense Refill  . amLODipine (NORVASC) 10 MG tablet Take 10 mg by mouth daily.      . carvedilol (COREG) 6.25 MG tablet Take 1 tablet (6.25 mg total) by mouth 2 (two) times daily with a meal.      . divalproex (DEPAKOTE) 125 MG DR tablet Take 125 mg by mouth at bedtime.      . furosemide (LASIX) 20 MG tablet Take 20 mg by mouth daily. Weight gain of 3 pounds in 24 hours      . Iron-FA-B Cmp-C-Biot-Probiotic (FUSION PLUS) CAPS 1 PO DAILY  30 capsule  11  . levothyroxine (SYNTHROID, LEVOTHROID) 100 MCG tablet Take 100 mcg by mouth daily before breakfast.      . pantoprazole (PROTONIX) 20 MG tablet Take 20 mg by mouth daily.      . sertraline (ZOLOFT) 25 MG tablet Take 25 mg by mouth daily.        . Vitamin D, Ergocalciferol, (DRISDOL) 50000 UNITS CAPS capsule Take 50,000 Units by mouth every Saturday.      Marland Kitchen acetaminophen (TYLENOL) 325 MG tablet Take 2 tablets (650 mg total) by mouth every 6 (six) hours as needed for fever.      . cetirizine (QC ALL DAY ALLERGY) 10 MG tablet Take 5 mg by mouth every 6 (six) hours as needed (for itching).      Marland Kitchen guaiFENesin-dextromethorphan (ROBITUSSIN DM) 100-10 MG/5ML syrup Take 5 mLs by mouth 3 (three)  times daily as needed for cough.      Marland Kitchen LORazepam (ATIVAN) 0.5 MG tablet Take 0.25 mg by mouth every 4 (four) hours as needed for anxiety.       . promethazine (PHENERGAN) 25 MG tablet Take 25 mg by mouth every 6 (six) hours as needed for nausea.      Marland Kitchen zinc oxide 20 % ointment Apply 1 application topically 3 (three) times daily as needed. *One application applied to affected area(s) of buttocks three times daily as needed for redness        Objective: BP 132/68  Pulse 71  Temp(Src) 94 F (34.4 C) (Rectal)  Resp 19  Ht 5\' 2"  (1.575 m)  Wt 47.628 kg (105 lb)  BMI 19.20 kg/m2  SpO2 93% Exam: General: elderly ill appearing pt.  HEENT: dry mm, PERRL Cardiovascular:  RRR, distant, II/VI systolic murmur Respiratory: CTAB, nml effort Abdomen: suprapubic ttp, NABS Extremities: no edema.  2+ pulese Skin: dry w/ numerous skin tears, no sacral decubitus Neuro: Pt responds w/ groaning and weak flailing movements to noxious sternal rub, EOMI, PERRL. Moves all extremities to painful stimuli. No facial droop.   Labs and Imaging: Results for orders placed during the hospital encounter of 03/19/14 (from the past 24 hour(s))  CBC WITH DIFFERENTIAL     Status: Abnormal   Collection Time    03/19/14  7:06 PM      Result Value Ref Range   WBC 2.3 (*) 4.0 - 10.5 K/uL   RBC 3.83 (*) 3.87 - 5.11 MIL/uL   Hemoglobin 8.1 (*) 12.0 - 15.0 g/dL   HCT 27.6 (*) 36.0 - 46.0 %   MCV 72.1 (*) 78.0 - 100.0 fL   MCH 21.1 (*) 26.0 - 34.0 pg   MCHC 29.3 (*) 30.0 - 36.0 g/dL   RDW 26.3 (*) 11.5 - 15.5 %   Platelets 122 (*) 150 - 400 K/uL   Neutrophils Relative % 69  43 - 77 %   Neutro Abs 1.6 (*) 1.7 - 7.7 K/uL   Lymphocytes Relative 18  12 - 46 %   Lymphs Abs 0.4 (*) 0.7 - 4.0 K/uL   Monocytes Relative 8  3 - 12 %   Monocytes Absolute 0.2  0.1 - 1.0 K/uL   Eosinophils Relative 6 (*) 0 - 5 %   Eosinophils Absolute 0.1  0.0 - 0.7 K/uL   Basophils Relative 0  0 - 1 %   Basophils Absolute 0.0  0.0 - 0.1 K/uL   COMPREHENSIVE METABOLIC PANEL     Status: Abnormal   Collection Time    03/19/14  7:06 PM      Result Value Ref Range   Sodium 149 (*) 137 - 147 mEq/L   Potassium 5.1  3.7 - 5.3 mEq/L   Chloride 114 (*) 96 - 112 mEq/L   CO2 24  19 - 32 mEq/L   Glucose, Bld 133 (*) 70 - 99 mg/dL   BUN 74 (*) 6 - 23 mg/dL   Creatinine, Ser 2.50 (*) 0.50 - 1.10 mg/dL   Calcium 9.0  8.4 - 10.5 mg/dL   Total Protein 6.0  6.0 - 8.3 g/dL   Albumin 2.6 (*) 3.5 - 5.2 g/dL   AST 35  0 - 37 U/L   ALT 38 (*) 0 - 35 U/L   Alkaline Phosphatase 92  39 - 117 U/L   Total Bilirubin <0.2 (*) 0.3 - 1.2 mg/dL   GFR calc non Af Amer 16 (*) >90 mL/min   GFR calc Af Amer 18 (*) >90 mL/min   Anion gap 11  5 - 15  CBG MONITORING, ED     Status: Abnormal   Collection Time    03/19/14  7:06 PM      Result Value Ref Range   Glucose-Capillary 118 (*) 70 - 99 mg/dL  LACTIC ACID, PLASMA     Status: None   Collection Time    03/19/14  7:06 PM      Result Value Ref Range   Lactic Acid, Venous 1.4  0.5 - 2.2 mmol/L  TROPONIN I     Status: None   Collection Time    03/19/14  7:06 PM      Result Value Ref Range   Troponin I <0.30  <0.30 ng/mL  PRO B NATRIURETIC PEPTIDE  Status: Abnormal   Collection Time    03/19/14  7:06 PM      Result Value Ref Range   Pro B Natriuretic peptide (BNP) 722.6 (*) 0 - 450 pg/mL  URINALYSIS, ROUTINE W REFLEX MICROSCOPIC     Status: Abnormal   Collection Time    03/19/14  8:05 PM      Result Value Ref Range   Color, Urine YELLOW  YELLOW   APPearance CLEAR  CLEAR   Specific Gravity, Urine 1.020  1.005 - 1.030   pH 5.5  5.0 - 8.0   Glucose, UA NEGATIVE  NEGATIVE mg/dL   Hgb urine dipstick NEGATIVE  NEGATIVE   Bilirubin Urine NEGATIVE  NEGATIVE   Ketones, ur NEGATIVE  NEGATIVE mg/dL   Protein, ur TRACE (*) NEGATIVE mg/dL   Urobilinogen, UA 0.2  0.0 - 1.0 mg/dL   Nitrite POSITIVE (*) NEGATIVE   Leukocytes, UA NEGATIVE  NEGATIVE  URINE MICROSCOPIC-ADD ON     Status: Abnormal    Collection Time    03/19/14  8:05 PM      Result Value Ref Range   Squamous Epithelial / LPF RARE  RARE   WBC, UA 0-2  <3 WBC/hpf   Bacteria, UA MANY (*) RARE   Casts HYALINE CASTS (*) NEGATIVE    Dg Shoulder Right  03/15/2014   CLINICAL DATA:  Right shoulder pain.  EXAM: RIGHT SHOULDER - 2+ VIEW  COMPARISON:  None.  FINDINGS: Comminuted fractures of the distal right clavicle with inferior displacement and overriding of the distal fracture fragments. Visualization of the right shoulder is limited due to non standard views. No definite humeral fracture although glenohumeral dislocation cannot be excluded due to poor visualization.  IMPRESSION: Displaced fracture of the distal right clavicle. Limited visualization of the shoulder joint.   Electronically Signed   By: Lucienne Capers M.D.   On: 03/15/2014 22:25   Ct Head Wo Contrast  03/19/2014   CLINICAL DATA:  Facial droop.  Code stroke.  Abnormal behavior.  EXAM: CT HEAD WITHOUT CONTRAST  TECHNIQUE: Contiguous axial images were obtained from the base of the skull through the vertex without intravenous contrast.  COMPARISON:  03/15/2014  FINDINGS: Small remote infarcts in the right inferior cerebellum. Small remote lacunar infarct in the right lower pons along the cerebellar peduncle, image 8 series 2.  Remote lacunar infarct in the left lentiform nucleus.  Ex vacuo ventriculomegaly. Remote lacunar infarct in the head of the left caudate nucleus. Periventricular white matter and corona radiata hypodensities favor chronic ischemic microvascular white matter disease.  No intracranial hemorrhage, mass lesion, or acute CVA. There is complete opacification of the visualized portion of the right maxillary sinus with medial bulging of the medial wall of the maxillary sinus and internal calcifications. Bony thickening in the maxillary sinuses present. This appearance is similar to prior. There is also opacification of multiple right-sided ethmoid air cells and  could complete opacification of the right frontal sinus. Small right mastoid effusion.  Upper cervical fixation hardware is partially included.  IMPRESSION: 1. No acute intracranial findings. Scattered remote infarcts. Periventricular white matter and corona radiata hypodensities favor chronic ischemic microvascular white matter disease. 2. Complete opacification of the visualize right frontal sinuses and right maxillary sinus, with chronic medial wall obliteration of the right maxillary sinus. Acute sinusitis of the right frontal sinus is not readily excluded. Adjacent chronic ethmoid sinusitis is also present. 3. Small right mastoid effusion. These results were called by telephone at the time  of interpretation on 03/19/2014 at 6:54 pm to Dr. Milton Ferguson , who verbally acknowledged these results.   Electronically Signed   By: Sherryl Barters M.D.   On: 03/19/2014 18:54   Ct Head Wo Contrast  03/15/2014   CLINICAL DATA:  Patient fell from toilet onto floor hitting head with right side lacerations stable  EXAM: CT HEAD WITHOUT CONTRAST  CT CERVICAL SPINE WITHOUT CONTRAST  TECHNIQUE: Multidetector CT imaging of the head and cervical spine was performed following the standard protocol without intravenous contrast. Multiplanar CT image reconstructions of the cervical spine were also generated.  COMPARISON:  10/27/2013  FINDINGS: CT HEAD FINDINGS  Severe atrophy with stable proportional dilatation of the ventricles. Chronic lacunar infarcts left basal ganglia, stable. Study limited by motion artifact, but no evidence of acute hemorrhage or extra-axial fluid. No evidence of acute infarct. Complete opacification of the visualized portion of the maxillary sinus, stable from prior study, as is wall thickening and sclerosis of the maxillary sinus and involvement of ethmoid air cells on the right. Right scalp laceration noted.  CT CERVICAL SPINE FINDINGS  There is normal alignment with no prevertebral soft tissue  swelling. There is no fracture. There is moderate spondylosis throughout the cervical spine. There are areas postsurgical change at C1 and C2 with evidence of posterior fusion. There are numerous pulmonary nodules seen visualized portion of the lung apices bilaterally, all measuring less than or equal to 5 mm. These are not seen on the prior study, although less volume of the lung apices was included at that time.  IMPRESSION: No acute intracranial abnormality. No acute abnormality involving cervical spine.  Multiple lung nodules seen in the apices. Given history of breast cancer, this is concerning for metastasis. Formal evaluation of the thorax with CT scan suggested.   Electronically Signed   By: Skipper Cliche M.D.   On: 03/15/2014 22:16   Ct Cervical Spine Wo Contrast  03/15/2014   CLINICAL DATA:  Patient fell from toilet onto floor hitting head with right side lacerations stable  EXAM: CT HEAD WITHOUT CONTRAST  CT CERVICAL SPINE WITHOUT CONTRAST  TECHNIQUE: Multidetector CT imaging of the head and cervical spine was performed following the standard protocol without intravenous contrast. Multiplanar CT image reconstructions of the cervical spine were also generated.  COMPARISON:  10/27/2013  FINDINGS: CT HEAD FINDINGS  Severe atrophy with stable proportional dilatation of the ventricles. Chronic lacunar infarcts left basal ganglia, stable. Study limited by motion artifact, but no evidence of acute hemorrhage or extra-axial fluid. No evidence of acute infarct. Complete opacification of the visualized portion of the maxillary sinus, stable from prior study, as is wall thickening and sclerosis of the maxillary sinus and involvement of ethmoid air cells on the right. Right scalp laceration noted.  CT CERVICAL SPINE FINDINGS  There is normal alignment with no prevertebral soft tissue swelling. There is no fracture. There is moderate spondylosis throughout the cervical spine. There are areas postsurgical change  at C1 and C2 with evidence of posterior fusion. There are numerous pulmonary nodules seen visualized portion of the lung apices bilaterally, all measuring less than or equal to 5 mm. These are not seen on the prior study, although less volume of the lung apices was included at that time.  IMPRESSION: No acute intracranial abnormality. No acute abnormality involving cervical spine.  Multiple lung nodules seen in the apices. Given history of breast cancer, this is concerning for metastasis. Formal evaluation of the thorax with CT scan suggested.  Electronically Signed   By: Skipper Cliche M.D.   On: 03/15/2014 22:16   Dg Chest Portable 1 View  03/19/2014   CLINICAL DATA:  Altered mental status ; hypertension with history of breast carcinoma  EXAM: PORTABLE CHEST - 1 VIEW  COMPARISON:  February 23, 2014 and September 26, 2012  FINDINGS: There is generalized interstitial edema which appears superimposed on a degree of interstitial fibrosis. There are small pleural effusions bilaterally. There is no airspace consolidation. Heart is enlarged with pulmonary vascularity demonstrating pulmonary venous hypertension. No adenopathy. There is atherosclerotic change in the aorta.  IMPRESSION: Evidence of congestive heart failure superimposed on pulmonary fibrosis. No airspace consolidation.   Electronically Signed   By: Lowella Grip M.D.   On: 03/19/2014 20:42   Dg Chest Portable 1 View  02/23/2014   CLINICAL DATA:  Weakness, with history of hypertension.  EXAM: PORTABLE CHEST - 1 VIEW  COMPARISON:  01/12/2014.  FINDINGS: Cardiomegaly. Calcified tortuous aorta. Bilateral airspace opacities with effusions, probable pulmonary edema. Baseline chronic lung disease with interstitial prominence. Chronic deformity left humeral neck. Osteopenia. Worsening aeration from priors.  IMPRESSION: Cardiomegaly with bilateral airspace opacities and effusions, likely CHF.   Electronically Signed   By: Rolla Flatten M.D.   On: 02/23/2014 19:18      Waldemar Dickens, MD Family Medicine 03/19/2014, 10:24 PM [facility]

## 2014-03-19 NOTE — ED Provider Notes (Signed)
CSN: [id number]     Arrival date & time 03/19/14  1825 History   First MD Initiated Contact with Patient 03/19/14 1830     Chief Complaint  Patient presents with  . Code Stroke     (Consider location/radiation/quality/duration/timing/severity/associated sxs/prior Treatment) Patient is a 80 y.o. female presenting with altered mental status. The history is provided by the nursing home (pt became unresponsive around 5 pm tonight.  she is a demented pt and dnr).  Altered Mental Status Presenting symptoms: no behavior changes   Severity:  Severe Most recent episode:  Today Episode history:  Unable to specify Timing:  Constant Progression:  Unchanged Chronicity:  New Context: dementia     Past Medical History  Diagnosis Date  . Hypertension   . Thyroid disease     hypothyroid  . Cancer     breast  . Hypothyroid   . Alzheimer disease   . Osteoporosis   . Hip fracture   . LVH (left ventricular hypertrophy)   . Acute on chronic diastolic CHF (congestive heart failure) 07/09/2012.  Marland Kitchen CKD (chronic kidney disease) stage 4, GFR 15-29 ml/min 09/23/2011  . Dementia    Past Surgical History  Procedure Laterality Date  . Mastectomy      right  . Colonoscopy  Dec 2011    Dr. Gala Romney: normal rectum, pancolonic diverticula, fixed clot in sigmoid s/p hemostasis and clipping , tubular adenoma  . Agile capsule N/A 02/27/2014    Procedure: AGILE CAPSULE;  Surgeon: Danie Binder, MD;  [facility]: [facility];  Service: Endoscopy;  Laterality: N/A;  . Esophagogastroduodenoscopy N/A 02/26/2014    Procedure: ESOPHAGOGASTRODUODENOSCOPY (EGD);  Surgeon: Danie Binder, MD;  Location: [facility];  Service: Endoscopy;  Laterality: N/A;   Family History  Problem Relation Age of Onset  . Colon cancer Neg Hx    History  Substance Use Topics  . Smoking status: Never Smoker   . Smokeless tobacco: Never Used  . Alcohol Use: No   OB History   Grav Para Term Preterm Abortions TAB SAB Ect Mult Living                  Review of Systems  Unable to perform ROS: Dementia      Allergies  Ace inhibitors; Penicillins; and Sulfa antibiotics  Home Medications   Prior to Admission medications   Medication Sig Start Date End Date Taking? Authorizing Provider  amLODipine (NORVASC) 10 MG tablet Take 10 mg by mouth daily.   Yes Historical Provider, MD  carvedilol (COREG) 6.25 MG tablet Take 1 tablet (6.25 mg total) by mouth 2 (two) times daily with a meal. 07/13/12  Yes Rexene Alberts, MD  divalproex (DEPAKOTE) 125 MG DR tablet Take 125 mg by mouth at bedtime.   Yes Historical Provider, MD  furosemide (LASIX) 20 MG tablet Take 20 mg by mouth daily. Weight gain of 3 pounds in 24 hours   Yes Historical Provider, MD  Iron-FA-B Cmp-C-Biot-Probiotic (FUSION PLUS) CAPS 1 PO DAILY 03/12/14  Yes Danie Binder, MD  levothyroxine (SYNTHROID, LEVOTHROID) 100 MCG tablet Take 100 mcg by mouth daily before breakfast.   Yes Historical Provider, MD  pantoprazole (PROTONIX) 20 MG tablet Take 20 mg by mouth daily.   Yes Historical Provider, MD  polyethylene glycol powder (GLYCOLAX/MIRALAX) powder Take 17 g by mouth daily.   Yes Historical Provider, MD  sertraline (ZOLOFT) 25 MG tablet Take 25 mg by mouth daily.     Yes Historical Provider,  MD  Vitamin D, Ergocalciferol, (DRISDOL) 50000 UNITS CAPS capsule Take 50,000 Units by mouth every Saturday.   Yes Historical Provider, MD  acetaminophen (TYLENOL) 325 MG tablet Take 2 tablets (650 mg total) by mouth every 6 (six) hours as needed for fever. 07/13/12   Rexene Alberts, MD  cetirizine (QC ALL DAY ALLERGY) 10 MG tablet Take 5 mg by mouth every 6 (six) hours as needed (for itching).    Historical Provider, MD  guaiFENesin-dextromethorphan (ROBITUSSIN DM) 100-10 MG/5ML syrup Take 5 mLs by mouth 3 (three) times daily as needed for cough.    Historical Provider, MD  LORazepam (ATIVAN) 0.5 MG tablet Take 0.25 mg by mouth every 4 (four) hours as needed for anxiety.      Historical Provider, MD  promethazine (PHENERGAN) 25 MG tablet Take 25 mg by mouth every 6 (six) hours as needed for nausea.    Historical Provider, MD  zinc oxide 20 % ointment Apply 1 application topically 3 (three) times daily as needed. *One application applied to affected area(s) of buttocks three times daily as needed for redness    Historical Provider, MD   BP 109/88  Pulse 60  Resp 20  Ht 5\' 2"  (1.575 m)  Wt 105 lb (47.628 kg)  BMI 19.20 kg/m2  SpO2 90% Physical Exam  Constitutional:  cachetic  HENT:  Head: Normocephalic.  Eyes: Conjunctivae and EOM are normal. No scleral icterus.  Neck: Neck supple. No thyromegaly present.  Cardiovascular: Normal rate and regular rhythm.  Exam reveals no gallop and no friction rub.   No murmur heard. Pulmonary/Chest: No stridor. She has no wheezes. She has no rales. She exhibits no tenderness.  Abdominal: She exhibits no distension. There is no tenderness. There is no rebound.  Musculoskeletal:  Tender right shoulder from fractured clavicle.  Lower extremities with contractures  Lymphadenopathy:    She has no cervical adenopathy.  Neurological: She exhibits normal muscle tone. Coordination normal.  Pt awake and only responds to painful stimuli  Skin: No rash noted. No erythema.    ED Course  Procedures (including critical care time) Labs Review Labs Reviewed  CBC WITH DIFFERENTIAL - Abnormal; Notable for the following:    WBC 2.3 (*)    RBC 3.83 (*)    Hemoglobin 8.1 (*)    HCT 27.6 (*)    MCV 72.1 (*)    MCH 21.1 (*)    MCHC 29.3 (*)    RDW 26.3 (*)    Platelets 122 (*)    Neutro Abs 1.6 (*)    Lymphs Abs 0.4 (*)    Eosinophils Relative 6 (*)    All other components within normal limits  COMPREHENSIVE METABOLIC PANEL - Abnormal; Notable for the following:    Sodium 149 (*)    Chloride 114 (*)    Glucose, Bld 133 (*)    BUN 74 (*)    Creatinine, Ser 2.50 (*)    Albumin 2.6 (*)    ALT 38 (*)    Total Bilirubin <0.2 (*)     GFR calc non Af Amer 16 (*)    GFR calc Af Amer 18 (*)    All other components within normal limits  URINALYSIS, ROUTINE W REFLEX MICROSCOPIC - Abnormal; Notable for the following:    Protein, ur TRACE (*)    Nitrite POSITIVE (*)    All other components within normal limits  URINE MICROSCOPIC-ADD ON - Abnormal; Notable for the following:    Bacteria, UA MANY (*)  Casts HYALINE CASTS (*)    All other components within normal limits  CBG MONITORING, ED - Abnormal; Notable for the following:    Glucose-Capillary 118 (*)    All other components within normal limits  LACTIC ACID, PLASMA  TROPONIN I  PRO B NATRIURETIC PEPTIDE    Imaging Review Ct Head Wo Contrast  03/19/2014   CLINICAL DATA:  Facial droop.  Code stroke.  Abnormal behavior.  EXAM: CT HEAD WITHOUT CONTRAST  TECHNIQUE: Contiguous axial images were obtained from the base of the skull through the vertex without intravenous contrast.  COMPARISON:  03/15/2014  FINDINGS: Small remote infarcts in the right inferior cerebellum. Small remote lacunar infarct in the right lower pons along the cerebellar peduncle, image 8 series 2.  Remote lacunar infarct in the left lentiform nucleus.  Ex vacuo ventriculomegaly. Remote lacunar infarct in the head of the left caudate nucleus. Periventricular white matter and corona radiata hypodensities favor chronic ischemic microvascular white matter disease.  No intracranial hemorrhage, mass lesion, or acute CVA. There is complete opacification of the visualized portion of the right maxillary sinus with medial bulging of the medial wall of the maxillary sinus and internal calcifications. Bony thickening in the maxillary sinuses present. This appearance is similar to prior. There is also opacification of multiple right-sided ethmoid air cells and could complete opacification of the right frontal sinus. Small right mastoid effusion.  Upper cervical fixation hardware is partially included.  IMPRESSION: 1. No  acute intracranial findings. Scattered remote infarcts. Periventricular white matter and corona radiata hypodensities favor chronic ischemic microvascular white matter disease. 2. Complete opacification of the visualize right frontal sinuses and right maxillary sinus, with chronic medial wall obliteration of the right maxillary sinus. Acute sinusitis of the right frontal sinus is not readily excluded. Adjacent chronic ethmoid sinusitis is also present. 3. Small right mastoid effusion. These results were called by telephone at the time of interpretation on 03/19/2014 at 6:54 pm to Dr. Milton Ferguson , who verbally acknowledged these results.   Electronically Signed   By: Sherryl Barters M.D.   On: 03/19/2014 18:54   Dg Chest Portable 1 View  03/19/2014   CLINICAL DATA:  Altered mental status ; hypertension with history of breast carcinoma  EXAM: PORTABLE CHEST - 1 VIEW  COMPARISON:  February 23, 2014 and September 26, 2012  FINDINGS: There is generalized interstitial edema which appears superimposed on a degree of interstitial fibrosis. There are small pleural effusions bilaterally. There is no airspace consolidation. Heart is enlarged with pulmonary vascularity demonstrating pulmonary venous hypertension. No adenopathy. There is atherosclerotic change in the aorta.  IMPRESSION: Evidence of congestive heart failure superimposed on pulmonary fibrosis. No airspace consolidation.   Electronically Signed   By: Lowella Grip M.D.   On: 03/19/2014 20:42     EKG Interpretation None     CRITICAL CARE Performed by: Kaleeah Gingerich L Total critical care time: 35 Critical care time was exclusive of separately billable procedures and treating other patients. Critical care was necessary to treat or prevent imminent or life-threatening deterioration. Critical care was time spent personally by me on the following activities: development of treatment plan with patient and/or surrogate as well as nursing, discussions with  consultants, evaluation of patient's response to treatment, examination of patient, obtaining history from patient or surrogate, ordering and performing treatments and interventions, ordering and review of laboratory studies, ordering and review of radiographic studies, pulse oximetry and re-evaluation of patient's condition.   MDM   Final diagnoses:  Altered mental status, unspecified altered mental status type    Neuro consult.  Stated no tpa.  Unsure is stroke.  Mri tomorrow if needed    Maudry Diego, MD 03/19/14 2146

## 2014-03-19 NOTE — Progress Notes (Signed)
ANTIBIOTIC CONSULT NOTE-Preliminary  Pharmacy Consult for Levofloxacin Indication: Urinary tract infection  Allergies  Allergen Reactions  . Ace Inhibitors Other (See Comments)    unknown  . Penicillins Other (See Comments)    unknown  . Sulfa Antibiotics Other (See Comments)    unknown    Patient Measurements: Height: 5\' 2"  (157.5 cm) Weight: 105 lb (47.628 kg) IBW/kg (Calculated) : 50.1  Vital Signs: Temp: 94 F (34.4 C) (07/30 2212) Temp src: Rectal (07/30 2212) BP: 132/68 mmHg (07/30 2200) Pulse Rate: 71 (07/30 2200)  Labs:  Recent Labs  03/19/14 1906  WBC 2.3*  HGB 8.1*  PLT 122*  CREATININE 2.50*    Estimated Creatinine Clearance: 10.6 ml/min (by C-G formula based on Cr of 2.5).  No results found for this basename: VANCOTROUGH, Corlis Leak, VANCORANDOM, GENTTROUGH, GENTPEAK, GENTRANDOM, TOBRATROUGH, TOBRAPEAK, TOBRARND, AMIKACINPEAK, AMIKACINTROU, AMIKACIN,  in the last 72 hours   Microbiology: Recent Results (from the past 720 hour(s))  URINE CULTURE     Status: None   Collection Time    02/24/14  2:15 AM      Result Value Ref Range Status   Specimen Description URINE, CATHETERIZED   Final   Special Requests NONE   Final   Culture  Setup Time     Final   Value: 02/24/2014 14:22     Performed at [place]     Final   Value: 15,000 COLONIES/ML     Performed at [facility]   Culture     Final   Value: Multiple bacterial morphotypes present, none predominant. Suggest appropriate recollection if clinically indicated.     Performed at [facility]   Report Status 02/25/2014 FINAL   Final    Medical History: Past Medical History  Diagnosis Date  . Hypertension   . Thyroid disease     hypothyroid  . Cancer     breast  . Hypothyroid   . Alzheimer disease   . Osteoporosis   . Hip fracture   . LVH (left ventricular hypertrophy)   . Acute on chronic diastolic CHF (congestive heart failure) 07/09/2012.  Marland Kitchen  CKD (chronic kidney disease) stage 4, GFR 15-29 ml/min 09/23/2011  . Dementia     Medications:   Assessment: 80 yo nursing home resident with altered mental status to be admitted for antibiotics for UTI/pylonephritis.  Goal of Therapy:  Eradicate infection  Plan:  Preliminary review of pertinent patient information completed.  Protocol will be initiated with a one-time dose of levofloxacin 500 mg IV.  Forestine Na clinical pharmacist will complete review during morning rounds to assess patient and finalize treatment regimen.  Norberto Sorenson, [facility] 03/19/2014,11:29 PM

## 2014-03-19 NOTE — ED Notes (Signed)
Tele-neurology done, Dr Roderic Palau notified.

## 2014-03-20 ENCOUNTER — Inpatient Hospital Stay (HOSPITAL_COMMUNITY): Payer: Medicare Other

## 2014-03-20 ENCOUNTER — Encounter (HOSPITAL_COMMUNITY): Payer: Self-pay | Admitting: *Deleted

## 2014-03-20 DIAGNOSIS — F0391 Unspecified dementia with behavioral disturbance: Secondary | ICD-10-CM

## 2014-03-20 DIAGNOSIS — E870 Hyperosmolality and hypernatremia: Secondary | ICD-10-CM

## 2014-03-20 DIAGNOSIS — F03918 Unspecified dementia, unspecified severity, with other behavioral disturbance: Secondary | ICD-10-CM

## 2014-03-20 DIAGNOSIS — G9341 Metabolic encephalopathy: Secondary | ICD-10-CM

## 2014-03-20 DIAGNOSIS — N184 Chronic kidney disease, stage 4 (severe): Secondary | ICD-10-CM

## 2014-03-20 DIAGNOSIS — R4182 Altered mental status, unspecified: Secondary | ICD-10-CM

## 2014-03-20 LAB — CBC
HCT: 25.5 % — ABNORMAL LOW (ref 36.0–46.0)
HEMOGLOBIN: 7.5 g/dL — AB (ref 12.0–15.0)
MCH: 21.1 pg — AB (ref 26.0–34.0)
MCHC: 29.4 g/dL — ABNORMAL LOW (ref 30.0–36.0)
MCV: 71.8 fL — ABNORMAL LOW (ref 78.0–100.0)
Platelets: 144 10*3/uL — ABNORMAL LOW (ref 150–400)
RBC: 3.55 MIL/uL — ABNORMAL LOW (ref 3.87–5.11)
RDW: 26.5 % — ABNORMAL HIGH (ref 11.5–15.5)
WBC: 3.6 10*3/uL — ABNORMAL LOW (ref 4.0–10.5)

## 2014-03-20 LAB — COMPREHENSIVE METABOLIC PANEL
ALK PHOS: 89 U/L (ref 39–117)
ALT: 38 U/L — AB (ref 0–35)
AST: 34 U/L (ref 0–37)
Albumin: 2.4 g/dL — ABNORMAL LOW (ref 3.5–5.2)
Anion gap: 11 (ref 5–15)
BUN: 69 mg/dL — ABNORMAL HIGH (ref 6–23)
CO2: 23 mEq/L (ref 19–32)
Calcium: 8.4 mg/dL (ref 8.4–10.5)
Chloride: 116 mEq/L — ABNORMAL HIGH (ref 96–112)
Creatinine, Ser: 2.49 mg/dL — ABNORMAL HIGH (ref 0.50–1.10)
GFR calc Af Amer: 18 mL/min — ABNORMAL LOW (ref >90)
GFR calc non Af Amer: 16 mL/min — ABNORMAL LOW (ref >90)
GLUCOSE: 86 mg/dL (ref 70–99)
POTASSIUM: 5.1 meq/L (ref 3.7–5.3)
SODIUM: 150 meq/L — AB (ref 137–147)
TOTAL PROTEIN: 5.7 g/dL — AB (ref 6.0–8.3)
Total Bilirubin: <0.2 mg/dL — ABNORMAL LOW (ref 0.3–1.2)

## 2014-03-20 LAB — RETICULOCYTES
RBC.: 3.75 MIL/uL — ABNORMAL LOW (ref 3.87–5.11)
Retic Count, Absolute: 22.5 10*3/uL (ref 19.0–186.0)
Retic Ct Pct: 0.6 % (ref 0.4–3.1)

## 2014-03-20 LAB — MRSA PCR SCREENING: MRSA by PCR: POSITIVE — AB

## 2014-03-20 MED ORDER — CHLORHEXIDINE GLUCONATE CLOTH 2 % EX PADS
6.0000 | MEDICATED_PAD | Freq: Every day | CUTANEOUS | Status: DC
  Administered 2014-03-20 – 2014-03-23 (×4): 6 via TOPICAL

## 2014-03-20 MED ORDER — DEXTROSE 5 % IV SOLN
INTRAVENOUS | Status: DC
  Administered 2014-03-20 – 2014-03-21 (×2): via INTRAVENOUS

## 2014-03-20 MED ORDER — POLYETHYLENE GLYCOL 3350 17 G PO PACK
17.0000 g | PACK | Freq: Every day | ORAL | Status: DC
  Administered 2014-03-23: 17 g via ORAL
  Filled 2014-03-20 (×3): qty 1

## 2014-03-20 MED ORDER — DIVALPROEX SODIUM 125 MG PO DR TAB
125.0000 mg | DELAYED_RELEASE_TABLET | Freq: Every day | ORAL | Status: DC
  Administered 2014-03-21 – 2014-03-23 (×3): 125 mg via ORAL
  Filled 2014-03-20 (×5): qty 1

## 2014-03-20 MED ORDER — LEVOFLOXACIN IN D5W 250 MG/50ML IV SOLN
250.0000 mg | INTRAVENOUS | Status: DC
  Filled 2014-03-20: qty 50

## 2014-03-20 MED ORDER — LORAZEPAM 0.5 MG PO TABS: 0.2500 mg | ORAL_TABLET | ORAL | Status: DC | PRN

## 2014-03-20 MED ORDER — SERTRALINE HCL 50 MG PO TABS
25.0000 mg | ORAL_TABLET | Freq: Every day | ORAL | Status: DC
  Administered 2014-03-22 – 2014-03-23 (×2): 25 mg via ORAL
  Filled 2014-03-20 (×3): qty 1

## 2014-03-20 MED ORDER — CETYLPYRIDINIUM CHLORIDE 0.05 % MT LIQD
7.0000 mL | Freq: Two times a day (BID) | OROMUCOSAL | Status: DC
  Administered 2014-03-20 – 2014-03-23 (×7): 7 mL via OROMUCOSAL

## 2014-03-20 MED ORDER — MUPIROCIN 2 % EX OINT
1.0000 "application " | TOPICAL_OINTMENT | Freq: Two times a day (BID) | CUTANEOUS | Status: DC
  Administered 2014-03-20 – 2014-03-24 (×9): 1 via NASAL
  Filled 2014-03-20 (×2): qty 22

## 2014-03-20 NOTE — Progress Notes (Signed)
Unfortunately the palliative medicine team does not currently have the capability to serve [facility]. I would recommend contacting Ms. Epple's hospice agency to further discuss goals of care and end of life with her and her family. Will sign off. Please call [phone] for guidance with symptom management as needed.   Vinie Sill, NP Palliative Medicine Team Pager # [phone] (M-F 8a-5p) Team Phone # [phone] (Nights/Weekends)

## 2014-03-20 NOTE — Progress Notes (Signed)
UR chart review completed.  

## 2014-03-20 NOTE — Progress Notes (Signed)
[facility] PROGRESS NOTE  Assessment/Plan: Encephalopathy, metabolic due to hypernatremia: - Was started on NS infusion, change to D5w. U/A no signs of infections. - Check a b-met in am. CXR showed pulmonary fibrosis - Started empirically on Levaquin with leukopenia and hypothermia. - consult [place]'s hospice agency.  Microcytic Anemia - No ferritin check, will likely be unreliable. - Will drop after hydration check in am.   Hypertension: - Bp high hold diuretics as pt is hypernatremic.   Dementia  CKD (chronic kidney disease) stage 4, GFR 15-29 ml/min - at baseline.   Chronic diastolic heart failure: - normotensive on presentation. CHF diastolic grade I. Last Echo from 2013 w/ EF of 60-65%.  - continue home Coreg, norvasc, hold lasix, pt intravascularly depleted.  Psych: severe dementia.  - Symptoms on admission likely also a manifestation of disease progression.  - Hold home zoloft, and depakote  Code Status: DNR Family Communication: daughter in law  Disposition Plan: inpatient   Consultants:  Hospice  Procedures:  MRI pending  Antibiotics:  levaquin  HPI/Subjective: None verbal.  Objective: Filed Vitals:   03/19/14 2245 03/19/14 2300 03/20/14 0032 03/20/14 0446  BP: 104/65 115/39 135/108 156/76  Pulse: 73 76 87 99  Temp:   98.5 F (36.9 C) 99.4 F (37.4 C)  TempSrc:   Rectal Oral  Resp: 21 20 20 20   Height:   5' 6"  (1.676 m)   Weight:   49.9 kg (110 lb 0.2 oz)   SpO2: 92% 93% 92% 90%    Intake/Output Summary (Last 24 hours) at 03/20/14 1236 Last data filed at 03/20/14 0600  Gross per 24 hour  Intake 478.33 ml  Output    500 ml  Net -21.67 ml   Filed Weights   03/19/14 1832 03/20/14 0032  Weight: 47.628 kg (105 lb) 49.9 kg (110 lb 0.2 oz)    Exam:  General: in no acute distress., none verbal HEENT: No bruits, no goiter.  Heart: Regular rate and rhythm. Lungs: Good air movement, clear  Abdomen: Soft, nontender,  nondistended, positive bowel sounds.    Data Reviewed: Basic Metabolic Panel:  Recent Labs Lab 03/19/14 1906 03/20/14 0631  NA 149* 150*  K 5.1 5.1  CL 114* 116*  CO2 24 23  GLUCOSE 133* 86  BUN 74* 69*  CREATININE 2.50* 2.49*  CALCIUM 9.0 8.4   Liver Function Tests:  Recent Labs Lab 03/19/14 1906 03/20/14 0631  AST 35 34  ALT 38* 38*  ALKPHOS 92 89  BILITOT <0.2* <0.2*  PROT 6.0 5.7*  ALBUMIN 2.6* 2.4*   No results found for this basename: LIPASE, AMYLASE,  in the last 168 hours No results found for this basename: AMMONIA,  in the last 168 hours CBC:  Recent Labs Lab 03/19/14 1906 03/20/14 0631  WBC 2.3* 3.6*  NEUTROABS 1.6*  --   HGB 8.1* 7.5*  HCT 27.6* 25.5*  MCV 72.1* 71.8*  PLT 122* 144*   Cardiac Enzymes:  Recent Labs Lab 03/19/14 1906  TROPONINI <0.30   BNP (last 3 results)  Recent Labs  02/23/14 1935 03/19/14 1906  PROBNP 6266.0* 722.6*   CBG:  Recent Labs Lab 03/19/14 1906  GLUCAP 118*    Recent Results (from the past 240 hour(s))  MRSA PCR SCREENING     Status: Abnormal   Collection Time    03/20/14  1:07 AM      Result Value Ref Range Status   MRSA by PCR POSITIVE (*) NEGATIVE Final  Comment:            The GeneXpert MRSA Assay (FDA     approved for NASAL specimens     only), is one component of a     comprehensive MRSA colonization     surveillance program. It is not     intended to diagnose MRSA     infection nor to guide or     monitor treatment for     MRSA infections.     RESULT CALLED TO, READ BACK BY AND VERIFIED WITH:     Earlie Server AT 0315 ON [id number] BY FORSYTH K     Studies: Ct Head Wo Contrast  03/19/2014   CLINICAL DATA:  Facial droop.  Code stroke.  Abnormal behavior.  EXAM: CT HEAD WITHOUT CONTRAST  TECHNIQUE: Contiguous axial images were obtained from the base of the skull through the vertex without intravenous contrast.  COMPARISON:  03/15/2014  FINDINGS: Small remote infarcts in the right inferior  cerebellum. Small remote lacunar infarct in the right lower pons along the cerebellar peduncle, image 8 series 2.  Remote lacunar infarct in the left lentiform nucleus.  Ex vacuo ventriculomegaly. Remote lacunar infarct in the head of the left caudate nucleus. Periventricular white matter and corona radiata hypodensities favor chronic ischemic microvascular white matter disease.  No intracranial hemorrhage, mass lesion, or acute CVA. There is complete opacification of the visualized portion of the right maxillary sinus with medial bulging of the medial wall of the maxillary sinus and internal calcifications. Bony thickening in the maxillary sinuses present. This appearance is similar to prior. There is also opacification of multiple right-sided ethmoid air cells and could complete opacification of the right frontal sinus. Small right mastoid effusion.  Upper cervical fixation hardware is partially included.  IMPRESSION: 1. No acute intracranial findings. Scattered remote infarcts. Periventricular white matter and corona radiata hypodensities favor chronic ischemic microvascular white matter disease. 2. Complete opacification of the visualize right frontal sinuses and right maxillary sinus, with chronic medial wall obliteration of the right maxillary sinus. Acute sinusitis of the right frontal sinus is not readily excluded. Adjacent chronic ethmoid sinusitis is also present. 3. Small right mastoid effusion. These results were called by telephone at the time of interpretation on 03/19/2014 at 6:54 pm to Dr. Milton Ferguson , who verbally acknowledged these results.   Electronically Signed   By: Sherryl Barters M.D.   On: 03/19/2014 18:54   Dg Chest Portable 1 View  03/19/2014   CLINICAL DATA:  Altered mental status ; hypertension with history of breast carcinoma  EXAM: PORTABLE CHEST - 1 VIEW  COMPARISON:  February 23, 2014 and September 26, 2012  FINDINGS: There is generalized interstitial edema which appears superimposed  on a degree of interstitial fibrosis. There are small pleural effusions bilaterally. There is no airspace consolidation. Heart is enlarged with pulmonary vascularity demonstrating pulmonary venous hypertension. No adenopathy. There is atherosclerotic change in the aorta.  IMPRESSION: Evidence of congestive heart failure superimposed on pulmonary fibrosis. No airspace consolidation.   Electronically Signed   By: Lowella Grip M.D.   On: 03/19/2014 20:42    Scheduled Meds: . amLODipine  10 mg Oral Daily  . antiseptic oral rinse  7 mL Mouth Rinse BID  . carvedilol  6.25 mg Oral BID WC  . Chlorhexidine Gluconate Cloth  6 each Topical Daily  . feeding supplement (ENSURE COMPLETE)  237 mL Oral BID BM  . furosemide  20 mg Oral Daily  .  heparin  5,000 Units Subcutaneous 3 times per day  . [START ON 03/21/2014] levofloxacin (LEVAQUIN) IV  250 mg Intravenous Q48H  . levothyroxine  100 mcg Oral QAC breakfast  . mupirocin ointment  1 application Nasal BID  . polyethylene glycol  17 g Oral Daily   Continuous Infusions: . sodium chloride 100 mL/hr at 03/20/14 0113     Charlynne Cousins  [facility] Pager [phone]. If 8PM-8AM, please contact night-coverage at [email or web address], password [email or web address] 03/20/2014, 12:36 PM  LOS: 1 day      **Disclaimer: This note may have been dictated with voice recognition software. Similar sounding words can inadvertently be transcribed and this note may contain transcription errors which may not have been corrected upon publication of note.**

## 2014-03-20 NOTE — Progress Notes (Signed)
Late Entry approx 1200  Notified Dr. Olevia Bowens that the patient is not taking any medication due to her AMS.  He verbalized understanding.

## 2014-03-20 NOTE — Care Management Note (Addendum)
    Page 1 of 1   03/23/2014     3:33:24 PM CARE MANAGEMENT NOTE 03/23/2014  Patient:  Martha Pearson, Martha Pearson   Account Number:  [id number]  Date Initiated:  03/20/2014  Documentation initiated by:  Theophilus Kinds  Subjective/Objective Assessment:   Pt admitted from [facility]. Pt will [facility] to [facility] at discharge. Hospice referral called to [facility]. Face to face by Dr. Sonny Dandy will need to be done before Hospice can pick pt up.     Action/Plan:   Information sent via PL to Hospice for referral. Pts guardian, MD, and pts nurse aware of face to face need before Hospice can become involved. CSW is aware of pt and will arrange discharge to facility when medically stable.   Anticipated DC Date:  03/24/2014   Anticipated DC Plan:  ASSISTED LIVING / REST HOME  In-house referral  Clinical Social Worker      DC Planning Services  CM consult      PAC Choice  HOSPICE   Choice offered to / List presented to:  C-2 HC POA / Guardian           Status of service:  Completed, signed off Medicare Important Message given?  YES (If response is "NO", the following Medicare IM given date fields will be blank) Date Medicare IM given:  03/20/2014 Medicare IM given by:  Christinia Gully C Date Additional Medicare IM given:  03/23/2014 Additional Medicare IM given by:  Vladimir Creeks  Discharge Disposition:  [place]  Per UR Regulation:  Reviewed for med. necessity/level of care/duration of stay  If discussed at [place] of Stay Meetings, dates discussed:    Comments:  03/23/14 [place] RN/CM 03/20/14 [place], Therapist, sports BSN CM

## 2014-03-20 NOTE — Progress Notes (Signed)
Lab reported patient is positive for MRSA at 3:16am. Contact precautions initiated with PPE placed on patient's door.

## 2014-03-20 NOTE — Progress Notes (Signed)
ANTIBIOTIC CONSULT NOTE  Pharmacy Consult for Levaquin Indication: UTI  Allergies  Allergen Reactions  . Ace Inhibitors Other (See Comments)    unknown  . Penicillins Other (See Comments)    unknown  . Sulfa Antibiotics Other (See Comments)    unknown    Patient Measurements: Height: 5\' 6"  (167.6 cm) Weight: 110 lb 0.2 oz (49.9 kg) IBW/kg (Calculated) : 59.3  Vital Signs: Temp: 99.4 F (37.4 C) (07/31 0446) Temp src: Oral (07/31 0446) BP: 156/76 mmHg (07/31 0446) Pulse Rate: 99 (07/31 0446) Intake/Output from previous day: 07/30 0701 - 07/31 0700 In: 478.3 [I.V.:478.3] Out: 500 [Urine:500] Intake/Output from this shift:    Labs:  Recent Labs  03/19/14 1906 03/20/14 0631  WBC 2.3* 3.6*  HGB 8.1* 7.5*  PLT 122* 144*  CREATININE 2.50* 2.49*   Estimated Creatinine Clearance: 11.1 ml/min (by C-G formula based on Cr of 2.49). No results found for this basename: VANCOTROUGH, Corlis Leak, VANCORANDOM, GENTTROUGH, GENTPEAK, GENTRANDOM, TOBRATROUGH, TOBRAPEAK, TOBRARND, AMIKACINPEAK, AMIKACINTROU, AMIKACIN,  in the last 72 hours   Microbiology: Recent Results (from the past 720 hour(s))  URINE CULTURE     Status: None   Collection Time    02/24/14  2:15 AM      Result Value Ref Range Status   Specimen Description URINE, CATHETERIZED   Final   Special Requests NONE   Final   Culture  Setup Time     Final   Value: 02/24/2014 14:22     Performed at [facility]     Final   Value: 15,000 COLONIES/ML     Performed at [facility]   Culture     Final   Value: Multiple bacterial morphotypes present, none predominant. Suggest appropriate recollection if clinically indicated.     Performed at [facility]   Report Status 02/25/2014 FINAL   Final  MRSA PCR SCREENING     Status: Abnormal   Collection Time    03/20/14  1:07 AM      Result Value Ref Range Status   MRSA by PCR POSITIVE (*) NEGATIVE Final   Comment:            The  GeneXpert MRSA Assay (FDA     approved for NASAL specimens     only), is one component of a     comprehensive MRSA colonization     surveillance program. It is not     intended to diagnose MRSA     infection nor to guide or     monitor treatment for     MRSA infections.     RESULT CALLED TO, READ BACK BY AND VERIFIED WITH:     Earlie Server AT 0315 ON [id number] BY FORSYTH K    Anti-infectives   Start     Dose/Rate Route Frequency Ordered Stop   03/21/14 2200  Levofloxacin (LEVAQUIN) IVPB 250 mg     250 mg 50 mL/hr over 60 Minutes Intravenous Every 48 hours 03/20/14 0756     03/19/14 2330  levofloxacin (LEVAQUIN) IVPB 500 mg     500 mg 100 mL/hr over 60 Minutes Intravenous  Once 03/19/14 2327 03/20/14 2094      Assessment: 89 yoF admitted from NH after episode of unresponsiveness.  Head CT negative for acute stroke.  UTI suspected.  UA +.  Urine cx pending.  She was hypothermic on admission. She has chronic kidney disease.  Renal function is at patient's baseline.  Levaquin 7/31>>  Goal of Therapy:  Eradicate infection.  Plan:  Levaquin 500mg  IV x1 then 250mg  IV q48h Monitor renal function and cx data  Change to PO once appropriate  Roberts Bon, Lavonia Drafts 03/20/2014,7:58 AM

## 2014-03-20 NOTE — Clinical Social Work Psychosocial (Signed)
Clinical Social Work Department BRIEF PSYCHOSOCIAL ASSESSMENT 03/20/2014  Patient:  Martha Pearson, Martha Pearson     Account Number:  [id number]     Admit date:  03/19/2014  Clinical Social Worker:  Wyatt Haste  Date/Time:  03/20/2014 11:56 AM  Referred by:  Physician  Date Referred:  03/20/2014 Referred for  ALF Placement   Other Referral:   Interview type:  Family Other interview type:   Martha Pearson- her husband is pt's cousin. Martha Pearson is guardian    PSYCHOSOCIAL DATA Living Status:  FACILITY Admitted from facility:  [facility] Level of care:  Assisted Living Primary support name:  Martha Pearson Primary support relationship to patient:  FAMILY Degree of support available:   supportive    CURRENT CONCERNS Current Concerns  Post-Acute Placement   Other Concerns:    SOCIAL WORK ASSESSMENT / PLAN CSW met with pt's guardian, Martha Pearson at bedside. Martha Pearson's husband is pt's cousin. Pt is well known to CSW from previous admissions. She has been a resident at [facility] for the past 4 years and is on the memory care unit. Martha Pearson lives locally and states she sees pt daily. Pt brought to ED due to symptoms of stroke. Pt has had hospice in the past, but [facility] stopped services as pt had been with them longer than 6 months and was beginning to improve. At her last hospitalization, Martha Pearson said MD mentioned involving hospice again, but they had not done it yet. Last week pt fell and has staples to her head, skin tear to arm, and a broken clavicle. Martha Pearson is open to hospice at this point if that is recommended.  Per Linus Orn, administrator at facility reports pt is a total assist, including feeding. They are agreeable to return when ready. Pt is aggressive and combative at baseline.   Assessment/plan status:  Psychosocial Support/Ongoing Assessment of Needs Other assessment/ plan:   Information/referral to community resources:   [employer]     PATIENT'S/FAMILY'S RESPONSE TO PLAN OF CARE: Pt unable to discuss plan of care due to dementia. Family requests return to [facility] when medically stable. CSW to continue to follow.       Benay Pike, [place]

## 2014-03-21 LAB — BASIC METABOLIC PANEL
ANION GAP: 11 (ref 5–15)
BUN: 60 mg/dL — ABNORMAL HIGH (ref 6–23)
CHLORIDE: 114 meq/L — AB (ref 96–112)
CO2: 22 meq/L (ref 19–32)
Calcium: 8.9 mg/dL (ref 8.4–10.5)
Creatinine, Ser: 2.71 mg/dL — ABNORMAL HIGH (ref 0.50–1.10)
GFR calc Af Amer: 16 mL/min — ABNORMAL LOW (ref >90)
GFR calc non Af Amer: 14 mL/min — ABNORMAL LOW (ref >90)
Glucose, Bld: 92 mg/dL (ref 70–99)
Potassium: 4.1 mEq/L (ref 3.7–5.3)
SODIUM: 147 meq/L (ref 137–147)

## 2014-03-21 LAB — CBC
HEMATOCRIT: 25.9 % — AB (ref 36.0–46.0)
Hemoglobin: 7.4 g/dL — ABNORMAL LOW (ref 12.0–15.0)
MCH: 20.6 pg — ABNORMAL LOW (ref 26.0–34.0)
MCHC: 28.6 g/dL — ABNORMAL LOW (ref 30.0–36.0)
MCV: 71.9 fL — ABNORMAL LOW (ref 78.0–100.0)
Platelets: 113 10*3/uL — ABNORMAL LOW (ref 150–400)
RBC: 3.6 MIL/uL — ABNORMAL LOW (ref 3.87–5.11)
RDW: 27.4 % — ABNORMAL HIGH (ref 11.5–15.5)
WBC: 4.6 10*3/uL (ref 4.0–10.5)

## 2014-03-21 LAB — FOLATE: Folate: 19.5 ng/mL

## 2014-03-21 LAB — IRON AND TIBC
Iron: 52 ug/dL (ref 42–135)
SATURATION RATIOS: 17 % — AB (ref 20–55)
TIBC: 309 ug/dL (ref 250–470)
UIBC: 257 ug/dL (ref 125–400)

## 2014-03-21 LAB — FERRITIN: Ferritin: 51 ng/mL (ref 10–291)

## 2014-03-21 LAB — VITAMIN B12: Vitamin B-12: 1626 pg/mL — ABNORMAL HIGH (ref 211–911)

## 2014-03-21 MED ORDER — LEVOFLOXACIN 250 MG PO TABS
250.0000 mg | ORAL_TABLET | Freq: Every day | ORAL | Status: DC
  Administered 2014-03-21 – 2014-03-23 (×3): 250 mg via ORAL
  Filled 2014-03-21 (×4): qty 1

## 2014-03-21 MED ORDER — LEVOFLOXACIN 500 MG PO TABS: 500.0000 mg | ORAL_TABLET | Freq: Every day | ORAL | Status: DC

## 2014-03-21 NOTE — Plan of Care (Signed)
Problem: Phase I Progression Outcomes Goal: Voiding-avoid urinary catheter unless indicated Outcome: Completed/Met Date Met:  03/21/14 Patient is in the life of care.

## 2014-03-21 NOTE — Progress Notes (Signed)
[facility] PROGRESS NOTE  Assessment/Plan: Encephalopathy, metabolic due to hypernatremia: - KVO to D5w. U/A no signs of infections. Hypernatremia resolved. - CXR showed pulmonary fibrosis - Started empirically on Levaquin with leukopenia and hypothermia. - Hospice to follow at facility.  Microcytic Anemia: - Hbg Will drop after hydration check in am. - Cbc pending.  Hypertension: - Bp high hold diuretics as pt is hypernatremic.    CKD (chronic kidney disease) stage 4, GFR 15-29 ml/min - at baseline.   Chronic diastolic heart failure: - normotensive on presentation. CHF diastolic grade I. Last Echo from 2013 w/ EF of 60-65%.  - continue home Coreg, norvasc, hold lasix, pt intravascularly depleted.  Psych: severe dementia.  - Symptoms on admission likely also a manifestation of disease progression.  - Hold home zoloft, and depakote  Code Status: DNR Family Communication: daughter in law  Disposition Plan: inpatient   Consultants:  Hospice  Procedures:  MRI pending  Antibiotics:  levaquin  HPI/Subjective: None verbal.  Objective: Filed Vitals:   03/20/14 0032 03/20/14 0446 03/20/14 1514 03/21/14 0512  BP: 135/108 156/76 117/82 108/85  Pulse: 87 99 89 85  Temp: 98.5 F (36.9 C) 99.4 F (37.4 C) 98.5 F (36.9 C) 98 F (36.7 C)  TempSrc: Rectal Oral Oral Axillary  Resp: 20 20 20 18   Height: 5\' 6"  (1.676 m)     Weight: 49.9 kg (110 lb 0.2 oz)     SpO2: 92% 90% 94% 93%    Intake/Output Summary (Last 24 hours) at 03/21/14 1100 Last data filed at 03/21/14 0516  Gross per 24 hour  Intake      0 ml  Output    400 ml  Net   -400 ml   Filed Weights   03/19/14 1832 03/20/14 0032  Weight: 47.628 kg (105 lb) 49.9 kg (110 lb 0.2 oz)    Exam:  General: in no acute distress., none verbal HEENT: No bruits, no goiter.  Heart: Regular rate and rhythm. Lungs: Good air movement, clear  Abdomen: Soft, nontender, nondistended, positive bowel  sounds.    Data Reviewed: Basic Metabolic Panel:  Recent Labs Lab 03/19/14 1906 03/20/14 0631 03/21/14 0604  NA 149* 150* 147  K 5.1 5.1 4.1  CL 114* 116* 114*  CO2 24 23 22   GLUCOSE 133* 86 92  BUN 74* 69* 60*  CREATININE 2.50* 2.49* 2.71*  CALCIUM 9.0 8.4 8.9   Liver Function Tests:  Recent Labs Lab 03/19/14 1906 03/20/14 0631  AST 35 34  ALT 38* 38*  ALKPHOS 92 89  BILITOT <0.2* <0.2*  PROT 6.0 5.7*  ALBUMIN 2.6* 2.4*   No results found for this basename: LIPASE, AMYLASE,  in the last 168 hours No results found for this basename: AMMONIA,  in the last 168 hours CBC:  Recent Labs Lab 03/19/14 1906 03/20/14 0631  WBC 2.3* 3.6*  NEUTROABS 1.6*  --   HGB 8.1* 7.5*  HCT 27.6* 25.5*  MCV 72.1* 71.8*  PLT 122* 144*   Cardiac Enzymes:  Recent Labs Lab 03/19/14 1906  TROPONINI <0.30   BNP (last 3 results)  Recent Labs  02/23/14 1935 03/19/14 1906  PROBNP 6266.0* 722.6*   CBG:  Recent Labs Lab 03/19/14 1906  GLUCAP 118*    Recent Results (from the past 240 hour(s))  MRSA PCR SCREENING     Status: Abnormal   Collection Time    03/20/14  1:07 AM      Result Value Ref Range Status  MRSA by PCR POSITIVE (*) NEGATIVE Final   Comment:            The GeneXpert MRSA Assay (FDA     approved for NASAL specimens     only), is one component of a     comprehensive MRSA colonization     surveillance program. It is not     intended to diagnose MRSA     infection nor to guide or     monitor treatment for     MRSA infections.     RESULT CALLED TO, READ BACK BY AND VERIFIED WITH:     Earlie Server AT 0315 ON [id number] BY FORSYTH K     Studies: Dg Abd 1 View  03/20/2014   CLINICAL DATA:  Screening for MRI, had an Agile capsule on 02/27/2014  EXAM: ABDOMEN - 1 VIEW  COMPARISON:  01/12/2014  FINDINGS: Post ORIF of the proximal femoral bilaterally.  No other radiopaque foreign bodies identified.  Extensive costal cartilaginous calcification.  Increased stool  throughout colon.  Nonobstructive bowel gas pattern.  Questionable small RIGHT renal calculi.  Extensive costal cartilaginous calcification.  Osseous demineralization with degenerative changes lumbar spine and posttraumatic deformity of LEFT pelvis.  IMPRESSION: No definite radiopaque foreign bodies identified.   Electronically Signed   By: Lavonia Dana M.D.   On: 03/20/2014 14:38   Ct Head Wo Contrast  03/19/2014   CLINICAL DATA:  Facial droop.  Code stroke.  Abnormal behavior.  EXAM: CT HEAD WITHOUT CONTRAST  TECHNIQUE: Contiguous axial images were obtained from the base of the skull through the vertex without intravenous contrast.  COMPARISON:  03/15/2014  FINDINGS: Small remote infarcts in the right inferior cerebellum. Small remote lacunar infarct in the right lower pons along the cerebellar peduncle, image 8 series 2.  Remote lacunar infarct in the left lentiform nucleus.  Ex vacuo ventriculomegaly. Remote lacunar infarct in the head of the left caudate nucleus. Periventricular white matter and corona radiata hypodensities favor chronic ischemic microvascular white matter disease.  No intracranial hemorrhage, mass lesion, or acute CVA. There is complete opacification of the visualized portion of the right maxillary sinus with medial bulging of the medial wall of the maxillary sinus and internal calcifications. Bony thickening in the maxillary sinuses present. This appearance is similar to prior. There is also opacification of multiple right-sided ethmoid air cells and could complete opacification of the right frontal sinus. Small right mastoid effusion.  Upper cervical fixation hardware is partially included.  IMPRESSION: 1. No acute intracranial findings. Scattered remote infarcts. Periventricular white matter and corona radiata hypodensities favor chronic ischemic microvascular white matter disease. 2. Complete opacification of the visualize right frontal sinuses and right maxillary sinus, with chronic  medial wall obliteration of the right maxillary sinus. Acute sinusitis of the right frontal sinus is not readily excluded. Adjacent chronic ethmoid sinusitis is also present. 3. Small right mastoid effusion. These results were called by telephone at the time of interpretation on 03/19/2014 at 6:54 pm to Dr. Milton Ferguson , who verbally acknowledged these results.   Electronically Signed   By: Sherryl Barters M.D.   On: 03/19/2014 18:54   Mr Brain Wo Contrast  03/20/2014   CLINICAL DATA:  Confusion.  Facial droop.  EXAM: MRI HEAD WITHOUT CONTRAST  TECHNIQUE: Multiplanar, multiecho pulse sequences of the brain and surrounding structures were obtained without intravenous contrast.  COMPARISON:  CT head without contrast 03/19/2014  FINDINGS: The diffusion-weighted images demonstrate no evidence for acute or subacute  infarction. Moderate generalized atrophy and diffuse white matter disease is evident bilaterally. Remote lacunar infarcts are present in the left basal ganglia. Asymmetric atrophy is noted in the left cortical spinal tract and cerebral peduncle. Remote lacunar infarcts are noted in the right cerebellum.  Flow is present in the major intracranial arteries. The globes and orbits are intact.  The right maxillary sinus is opacified. Stents mucosal thickening is present in the ethmoid air cells bilaterally. The frontal sinus is opacified on the right. Mild mucosal thickening is present in the sphenoid sinuses bilaterally, right worse than left. Right greater left mastoid effusions are present.  IMPRESSION: 1. No acute intracranial abnormality. 2. Remote lacunar infarcts of the left basal ganglia and right cerebellum. 3. Moderate diffuse sinus disease, most prominent in the right maxillary sinus, right ethmoid air cells, and right frontal sinus. 4. Right greater left mastoid effusions. No obstructing nasopharyngeal lesions are evident.   Electronically Signed   By: Lawrence Santiago M.D.   On: 03/20/2014 20:05    Dg Chest Portable 1 View  03/19/2014   CLINICAL DATA:  Altered mental status ; hypertension with history of breast carcinoma  EXAM: PORTABLE CHEST - 1 VIEW  COMPARISON:  February 23, 2014 and September 26, 2012  FINDINGS: There is generalized interstitial edema which appears superimposed on a degree of interstitial fibrosis. There are small pleural effusions bilaterally. There is no airspace consolidation. Heart is enlarged with pulmonary vascularity demonstrating pulmonary venous hypertension. No adenopathy. There is atherosclerotic change in the aorta.  IMPRESSION: Evidence of congestive heart failure superimposed on pulmonary fibrosis. No airspace consolidation.   Electronically Signed   By: Lowella Grip M.D.   On: 03/19/2014 20:42    Scheduled Meds: . amLODipine  10 mg Oral Daily  . antiseptic oral rinse  7 mL Mouth Rinse BID  . carvedilol  6.25 mg Oral BID WC  . Chlorhexidine Gluconate Cloth  6 each Topical Daily  . divalproex  125 mg Oral QHS  . feeding supplement (ENSURE COMPLETE)  237 mL Oral BID BM  . heparin  5,000 Units Subcutaneous 3 times per day  . levofloxacin (LEVAQUIN) IV  250 mg Intravenous Q48H  . levothyroxine  100 mcg Oral QAC breakfast  . mupirocin ointment  1 application Nasal BID  . polyethylene glycol  17 g Oral Daily  . sertraline  25 mg Oral Daily   Continuous Infusions: . dextrose 100 mL/hr at 03/21/14 0424     Charlynne Cousins  [facility] Pager [phone]. If 8PM-8AM, please contact night-coverage at [email or web address], password [facility] 03/21/2014, 11:00 AM  LOS: 2 days      **Disclaimer: This note may have been dictated with voice recognition software. Similar sounding words can inadvertently be transcribed and this note may contain transcription errors which may not have been corrected upon publication of note.**

## 2014-03-22 DIAGNOSIS — N390 Urinary tract infection, site not specified: Secondary | ICD-10-CM

## 2014-03-22 LAB — BASIC METABOLIC PANEL
Anion gap: 12 (ref 5–15)
BUN: 56 mg/dL — AB (ref 6–23)
CHLORIDE: 113 meq/L — AB (ref 96–112)
CO2: 21 mEq/L (ref 19–32)
Calcium: 9.2 mg/dL (ref 8.4–10.5)
Creatinine, Ser: 2.7 mg/dL — ABNORMAL HIGH (ref 0.50–1.10)
GFR calc non Af Amer: 14 mL/min — ABNORMAL LOW (ref >90)
GFR, EST AFRICAN AMERICAN: 16 mL/min — AB (ref >90)
Glucose, Bld: 72 mg/dL (ref 70–99)
POTASSIUM: 4.2 meq/L (ref 3.7–5.3)
Sodium: 146 mEq/L (ref 137–147)

## 2014-03-22 NOTE — Progress Notes (Signed)
[facility] PROGRESS NOTE  Assessment/Plan: Encephalopathy, metabolic due to hypernatremia: - KVO  D5w. U/A no signs of infections. Hypernatremia resolved. - CXR showed pulmonary fibrosis - Started empirically on Levaquin with leukopenia and hypothermia. Cont levaquin for total of 7 days (first does on 7.3.2015. - Hospice to follow at facility. - SNF in 24 hrs- 48 hrs.  Microcytic Anemia: - Hbg stable. - ferritin 51.  Essential Hypertension: - Bp high hold diuretics as pt is hypernatremic. - Goal < 160/90.  CKD (chronic kidney disease) stage 4, GFR 15-29 ml/min - at baseline.   Chronic diastolic heart failure: - Normotensive on presentation. CHF diastolic grade I. Last Echo from 2013 w/ EF of 60-65%.  - Continue home Coreg, norvasc, hold lasix, pt intravascularly depleted.  Psych: severe dementia.  - Symptoms on admission likely also a manifestation of disease progression.  - Hold home zoloft, and depakote  Code Status: DNR Family Communication: daughter in law  Disposition Plan: inpatient   Consultants:  Hospice  Procedures:  MRI pending  Antibiotics:  levaquin  HPI/Subjective: None verbal.  Objective: Filed Vitals:   03/20/14 1514 03/21/14 0512 03/21/14 1522 03/22/14 0609  BP: 117/82 108/85 151/59 157/49  Pulse: 89 85 65 89  Temp: 98.5 F (36.9 C) 98 F (36.7 C) 97.7 F (36.5 C) 98 F (36.7 C)  TempSrc: Oral Axillary Oral Axillary  Resp: 20 18 18 20   Height:      Weight:      SpO2: 94% 93% 90% 90%    Intake/Output Summary (Last 24 hours) at 03/22/14 1129 Last data filed at 03/22/14 1005  Gross per 24 hour  Intake      0 ml  Output   1600 ml  Net  -1600 ml   Filed Weights   03/19/14 1832 03/20/14 0032  Weight: 47.628 kg (105 lb) 49.9 kg (110 lb 0.2 oz)    Exam:  General: in no acute distress., none verbal HEENT: No bruits, no goiter.  Heart: Regular rate and rhythm. Lungs: Good air movement, clear  Abdomen: Soft,  nontender, nondistended, positive bowel sounds.    Data Reviewed: Basic Metabolic Panel:  Recent Labs Lab 03/19/14 1906 03/20/14 0631 03/21/14 0604 03/22/14 0556  NA 149* 150* 147 146  K 5.1 5.1 4.1 4.2  CL 114* 116* 114* 113*  CO2 24 23 22 21   GLUCOSE 133* 86 92 72  BUN 74* 69* 60* 56*  CREATININE 2.50* 2.49* 2.71* 2.70*  CALCIUM 9.0 8.4 8.9 9.2   Liver Function Tests:  Recent Labs Lab 03/19/14 1906 03/20/14 0631  AST 35 34  ALT 38* 38*  ALKPHOS 92 89  BILITOT <0.2* <0.2*  PROT 6.0 5.7*  ALBUMIN 2.6* 2.4*   No results found for this basename: LIPASE, AMYLASE,  in the last 168 hours No results found for this basename: AMMONIA,  in the last 168 hours CBC:  Recent Labs Lab 03/19/14 1906 03/20/14 0631 03/21/14 0604  WBC 2.3* 3.6* 4.6  NEUTROABS 1.6*  --   --   HGB 8.1* 7.5* 7.4*  HCT 27.6* 25.5* 25.9*  MCV 72.1* 71.8* 71.9*  PLT 122* 144* 113*   Cardiac Enzymes:  Recent Labs Lab 03/19/14 1906  TROPONINI <0.30   BNP (last 3 results)  Recent Labs  02/23/14 1935 03/19/14 1906  PROBNP 6266.0* 722.6*   CBG:  Recent Labs Lab 03/19/14 1906  GLUCAP 118*    Recent Results (from the past 240 hour(s))  MRSA PCR SCREENING  Status: Abnormal   Collection Time    03/20/14  1:07 AM      Result Value Ref Range Status   MRSA by PCR POSITIVE (*) NEGATIVE Final   Comment:            The GeneXpert MRSA Assay (FDA     approved for NASAL specimens     only), is one component of a     comprehensive MRSA colonization     surveillance program. It is not     intended to diagnose MRSA     infection nor to guide or     monitor treatment for     MRSA infections.     RESULT CALLED TO, READ BACK BY AND VERIFIED WITH:     Earlie Server AT 0315 ON [id number] BY FORSYTH K     Studies: Dg Abd 1 View  03/20/2014   CLINICAL DATA:  Screening for MRI, had an Agile capsule on 02/27/2014  EXAM: ABDOMEN - 1 VIEW  COMPARISON:  01/12/2014  FINDINGS: Post ORIF of the proximal  femoral bilaterally.  No other radiopaque foreign bodies identified.  Extensive costal cartilaginous calcification.  Increased stool throughout colon.  Nonobstructive bowel gas pattern.  Questionable small RIGHT renal calculi.  Extensive costal cartilaginous calcification.  Osseous demineralization with degenerative changes lumbar spine and posttraumatic deformity of LEFT pelvis.  IMPRESSION: No definite radiopaque foreign bodies identified.   Electronically Signed   By: Lavonia Dana M.D.   On: 03/20/2014 14:38   Mr Brain Wo Contrast  03/20/2014   CLINICAL DATA:  Confusion.  Facial droop.  EXAM: MRI HEAD WITHOUT CONTRAST  TECHNIQUE: Multiplanar, multiecho pulse sequences of the brain and surrounding structures were obtained without intravenous contrast.  COMPARISON:  CT head without contrast 03/19/2014  FINDINGS: The diffusion-weighted images demonstrate no evidence for acute or subacute infarction. Moderate generalized atrophy and diffuse white matter disease is evident bilaterally. Remote lacunar infarcts are present in the left basal ganglia. Asymmetric atrophy is noted in the left cortical spinal tract and cerebral peduncle. Remote lacunar infarcts are noted in the right cerebellum.  Flow is present in the major intracranial arteries. The globes and orbits are intact.  The right maxillary sinus is opacified. Stents mucosal thickening is present in the ethmoid air cells bilaterally. The frontal sinus is opacified on the right. Mild mucosal thickening is present in the sphenoid sinuses bilaterally, right worse than left. Right greater left mastoid effusions are present.  IMPRESSION: 1. No acute intracranial abnormality. 2. Remote lacunar infarcts of the left basal ganglia and right cerebellum. 3. Moderate diffuse sinus disease, most prominent in the right maxillary sinus, right ethmoid air cells, and right frontal sinus. 4. Right greater left mastoid effusions. No obstructing nasopharyngeal lesions are evident.    Electronically Signed   By: Lawrence Santiago M.D.   On: 03/20/2014 20:05    Scheduled Meds: . amLODipine  10 mg Oral Daily  . antiseptic oral rinse  7 mL Mouth Rinse BID  . carvedilol  6.25 mg Oral BID WC  . Chlorhexidine Gluconate Cloth  6 each Topical Daily  . divalproex  125 mg Oral QHS  . feeding supplement (ENSURE COMPLETE)  237 mL Oral BID BM  . heparin  5,000 Units Subcutaneous 3 times per day  . levofloxacin  250 mg Oral Daily  . levothyroxine  100 mcg Oral QAC breakfast  . mupirocin ointment  1 application Nasal BID  . polyethylene glycol  17 g Oral Daily  .  sertraline  25 mg Oral Daily   Continuous Infusions:     Charlynne Cousins  [facility] Pager [phone]. If 8PM-8AM, please contact night-coverage at [email or web address], password [facility] 03/22/2014, 11:29 AM  LOS: 3 days      **Disclaimer: This note may have been dictated with voice recognition software. Similar sounding words can inadvertently be transcribed and this note may contain transcription errors which may not have been corrected upon publication of note.**

## 2014-03-22 NOTE — Progress Notes (Signed)
ANTIBIOTIC CONSULT NOTE  Pharmacy Consult for Levaquin Indication: UTI  Allergies  Allergen Reactions  . Ace Inhibitors Other (See Comments)    unknown  . Penicillins Other (See Comments)    unknown  . Sulfa Antibiotics Other (See Comments)    unknown    Patient Measurements: Height: 5\' 6"  (167.6 cm) Weight: 110 lb 0.2 oz (49.9 kg) IBW/kg (Calculated) : 59.3  Vital Signs: Temp: 98 F (36.7 C) (08/02 0609) Temp src: Axillary (08/02 0609) BP: 157/49 mmHg (08/02 0609) Pulse Rate: 89 (08/02 0609) Intake/Output from previous day: 08/01 0701 - 08/02 0700 In: -  Out: 1000 [Urine:1000] Intake/Output from this shift: Total I/O In: 0  Out: 600 [Urine:600]  Labs:  Recent Labs  03/19/14 1906 03/20/14 0631 03/21/14 0604 03/22/14 0556  WBC 2.3* 3.6* 4.6  --   HGB 8.1* 7.5* 7.4*  --   PLT 122* 144* 113*  --   CREATININE 2.50* 2.49* 2.71* 2.70*   Estimated Creatinine Clearance: 10.3 ml/min (by C-G formula based on Cr of 2.7). No results found for this basename: VANCOTROUGH, Corlis Leak, VANCORANDOM, GENTTROUGH, GENTPEAK, GENTRANDOM, TOBRATROUGH, TOBRAPEAK, TOBRARND, AMIKACINPEAK, AMIKACINTROU, AMIKACIN,  in the last 72 hours   Microbiology: Recent Results (from the past 720 hour(s))  URINE CULTURE     Status: None   Collection Time    02/24/14  2:15 AM      Result Value Ref Range Status   Specimen Description URINE, CATHETERIZED   Final   Special Requests NONE   Final   Culture  Setup Time     Final   Value: 02/24/2014 14:22     Performed at [place]     Final   Value: 15,000 COLONIES/ML     Performed at [facility]   Culture     Final   Value: Multiple bacterial morphotypes present, none predominant. Suggest appropriate recollection if clinically indicated.     Performed at [facility]   Report Status 02/25/2014 FINAL   Final  MRSA PCR SCREENING     Status: Abnormal   Collection Time    03/20/14  1:07 AM      Result  Value Ref Range Status   MRSA by PCR POSITIVE (*) NEGATIVE Final   Comment:            The GeneXpert MRSA Assay (FDA     approved for NASAL specimens     only), is one component of a     comprehensive MRSA colonization     surveillance program. It is not     intended to diagnose MRSA     infection nor to guide or     monitor treatment for     MRSA infections.     RESULT CALLED TO, READ BACK BY AND VERIFIED WITH:     Earlie Server AT 0315 ON [id number] BY FORSYTH K    Anti-infectives   Start     Dose/Rate Route Frequency Ordered Stop   03/21/14 2200  Levofloxacin (LEVAQUIN) IVPB 250 mg  Status:  Discontinued     250 mg 50 mL/hr over 60 Minutes Intravenous Every 48 hours 03/20/14 0756 03/21/14 1109   03/21/14 1115  levofloxacin (LEVAQUIN) tablet 500 mg  Status:  Discontinued     500 mg Oral Daily 03/21/14 1109 03/21/14 1109   03/21/14 1115  levofloxacin (LEVAQUIN) tablet 250 mg     250 mg Oral Daily 03/21/14 1113     03/19/14 2330  levofloxacin (LEVAQUIN) IVPB 500 mg     500 mg 100 mL/hr over 60 Minutes Intravenous  Once 03/19/14 2327 03/20/14 0940      Assessment: 89 yoF admitted from NH after episode of unresponsiveness.  Head CT negative for acute stroke.  UTI suspected.  UA +.  Urine cx pending.  She was hypothermic on admission. She has chronic kidney disease.  Renal function is at patient's baseline.   Levaquin 7/31>>  Goal of Therapy:  Eradicate infection.  Plan:  Dose Stable. ABX plan noted per MD progress note. Sign off.  Pricilla Larsson 03/22/2014,12:14 PM

## 2014-03-23 DIAGNOSIS — A419 Sepsis, unspecified organism: Principal | ICD-10-CM

## 2014-03-23 LAB — CBC
HCT: 26.3 % — ABNORMAL LOW (ref 36.0–46.0)
HEMOGLOBIN: 7.9 g/dL — AB (ref 12.0–15.0)
MCH: 21.5 pg — ABNORMAL LOW (ref 26.0–34.0)
MCHC: 30 g/dL (ref 30.0–36.0)
MCV: 71.5 fL — ABNORMAL LOW (ref 78.0–100.0)
Platelets: 108 10*3/uL — ABNORMAL LOW (ref 150–400)
RBC: 3.68 MIL/uL — AB (ref 3.87–5.11)
RDW: 27 % — ABNORMAL HIGH (ref 11.5–15.5)
WBC: 4 10*3/uL (ref 4.0–10.5)

## 2014-03-23 LAB — BASIC METABOLIC PANEL
ANION GAP: 14 (ref 5–15)
BUN: 57 mg/dL — ABNORMAL HIGH (ref 6–23)
CHLORIDE: 115 meq/L — AB (ref 96–112)
CO2: 20 mEq/L (ref 19–32)
Calcium: 9.5 mg/dL (ref 8.4–10.5)
Creatinine, Ser: 2.71 mg/dL — ABNORMAL HIGH (ref 0.50–1.10)
GFR calc non Af Amer: 14 mL/min — ABNORMAL LOW (ref >90)
GFR, EST AFRICAN AMERICAN: 16 mL/min — AB (ref >90)
Glucose, Bld: 69 mg/dL — ABNORMAL LOW (ref 70–99)
POTASSIUM: 4 meq/L (ref 3.7–5.3)
SODIUM: 149 meq/L — AB (ref 137–147)

## 2014-03-23 NOTE — Clinical Social Work Note (Signed)
CSW received consult for nursing home placement. Pt is a long term resident at [facility] on memory care unit. Facility has been providing total care to pt for some time. CSW spoke with guardian, Vaughan Basta who states that she still plans on pt returning to [facility] with hospice. Updated Whitney at facility and they are still agreeable as well. CSW will continue to follow.  Benay Pike, [place]

## 2014-03-23 NOTE — Progress Notes (Addendum)
Patient ID: Martha Pearson, female   DOB: September 05, 1917, 80 y.o.   MRN: [id number] [facility] PROGRESS NOTE  Martha Pearson BWL:[id number] DOB: 1917/11/07 DOA: 03/19/2014 PCP: Martha Cahill, MD  Brief narrative: 80 y.o. female with multiple medical comorbidities including but not limited to dementia, CHF, hypertension, chronic kidney disease, breast cancer who presented to [facility] ED 03/19/2014 with altered mental status which was thought to be from urinary tract infection or pyelonephritis versus hypernatremia. Patient has been followed by hospice for past 1 year. On admission, patient was found to have sodium of 149, creatinine of 2.5, hemoglobin was 8.1, WBC count 2.3 and platelet count of 122. During this hospital stay, patient had MRI of the brain which did not show acute intracranial abnormalities. There was a mention of remote lacunar infarcts in left basal ganglia and right cerebellar area. Chest x-ray did not show acute cardiopulmonary findings. Urinalysis showed many bacteria. In addition, patient was slightly hypothermic on admission. Patient was started on Levaquin on admission.  Assessment/Plan:   Principal problem: Acute metabolic encephalopathy / dementia  Possibly related to hypernatremia versus sepsis secondary to urinary tract infection  No significant changes in mental status. Please note that the pt has history of dementia so her altered mental status could be because of worsening dementia.  Followup PT evaluation and recommendations.  Continue Zoloft and Depakote  Principal problem: Sepsis secondary to urinary tract infection  Likely secondary to urinary tract infection  Patient met criteria for sepsis on admission with hypothermia, leukopenia and evidence of infection based on urinalysis.  Patient was started on Levaquin on admission. Please note that a urine culture and blood cultures were not obtained at the time of the admission to guide the antibiotic  regimen Pancytopenia  Possibly due to sepsis. White blood cell count normalized. Hemoglobin on 03/21/2014 is 7.4. Platelets are 113.  Repeat CBC today Hypernatremia  Likely secondary to dehydration. Patient received IV fluids on admission. Sodium ranges 147-149. Anemia of chronic disease  Likely anemia of renal disease. Hemoglobin is 7.4. Obtain CBC today..  Essential Hypertension:   Continue Norvasc 10 mg daily, Corag 6.25 mg by mouth twice a day CKD (chronic kidney disease) stage 4, GFR 15-29 ml/min    Creatinine is stable at 2.7 Chronic diastolic heart failure  Compensated. Last 2-D echo in 2013 showed ejection fraction of 60%.  Continue Corag 6.25 mg by mouth twice a day Moderate protein calorie malnutrition  In the context of chronic illness, dementia. Continue nutritional supplements.  DVT prophylaxis: Stopped heparin due to thrombocytopenia. Use SCDs bilaterally while patient is in hospital  Code Status: DNR/ DNI Family Communication: Family is not at the bedside this morning Disposition Plan: remains inpatient; SW assisting discharge plan  Procedures:  None  Antibiotics:  Levaquin 03/19/2014 -->   Leisa Lenz, MD  [facility] Pager [phone]  If 7PM-7AM, please contact night-coverage [email or web address] Password [email or web address] 03/23/2014, 11:51 AM   LOS: 4 days    HPI/Subjective: No acute overnight events.  Objective: Filed Vitals:   03/21/14 1522 03/22/14 0609 03/22/14 1425 03/23/14 0633  BP: 151/59 157/49 145/55 154/54  Pulse: 65 89 85 82  Temp: 97.7 F (36.5 C) 98 F (36.7 C) 98 F (36.7 C) 98 F (36.7 C)  TempSrc: Oral Axillary Axillary Axillary  Resp: 18 20 20 18   Height:      Weight:      SpO2: 90% 90% 90% 91%    Intake/Output Summary (Last 24 hours) at 03/23/14 1151 Last data filed  at 03/23/14 1033  Gross per 24 hour  Intake    600 ml  Output   1250 ml  Net   -650 ml    Exam:   General:  Pt is alert, ot in acute  distress  Cardiovascular: Regular rate and rhythm, S1/S2 apprecaited   Respiratory: Clear to auscultation bilaterally, no wheezing  Abdomen: Soft, non tender, non distended, bowel sounds present  Extremities: pulses DP and PT palpable bilaterally  Neuro: Grossly nonfocal  Data Reviewed: Basic Metabolic Panel:  Recent Labs Lab 03/19/14 1906 03/20/14 0631 03/21/14 0604 03/22/14 0556 03/23/14 0530  NA 149* 150* 147 146 149*  K 5.1 5.1 4.1 4.2 4.0  CL 114* 116* 114* 113* 115*  CO2 24 23 22 21 20   GLUCOSE 133* 86 92 72 69*  BUN 74* 69* 60* 56* 57*  CREATININE 2.50* 2.49* 2.71* 2.70* 2.71*  CALCIUM 9.0 8.4 8.9 9.2 9.5   Liver Function Tests:  Recent Labs Lab 03/19/14 1906 03/20/14 0631  AST 35 34  ALT 38* 38*  ALKPHOS 92 89  BILITOT <0.2* <0.2*  PROT 6.0 5.7*  ALBUMIN 2.6* 2.4*   No results found for this basename: LIPASE, AMYLASE,  in the last 168 hours No results found for this basename: AMMONIA,  in the last 168 hours CBC:  Recent Labs Lab 03/19/14 1906 03/20/14 0631 03/21/14 0604  WBC 2.3* 3.6* 4.6  NEUTROABS 1.6*  --   --   HGB 8.1* 7.5* 7.4*  HCT 27.6* 25.5* 25.9*  MCV 72.1* 71.8* 71.9*  PLT 122* 144* 113*   Cardiac Enzymes:  Recent Labs Lab 03/19/14 1906  TROPONINI <0.30   BNP: No components found with this basename: POCBNP,  CBG:  Recent Labs Lab 03/19/14 1906  GLUCAP 118*    MRSA PCR SCREENING     Status: Abnormal   Collection Time    03/20/14  1:07 AM      Result Value Ref Range Status   MRSA by PCR POSITIVE (*) NEGATIVE Final     Studies: No results found.  Scheduled Meds: . amLODipine  10 mg Oral Daily  . carvedilol  6.25 mg Oral BID WC  . divalproex  125 mg Oral QHS  . feeding supplement (ENSURE COMPLETE)  237 mL Oral BID BM  . heparin  5,000 Units Subcutaneous 3 times per day  . levofloxacin  250 mg Oral Daily  . levothyroxine  100 mcg Oral QAC breakfast  . polyethylene glycol  17 g Oral Daily  . sertraline  25 mg  Oral Daily

## 2014-03-23 NOTE — Progress Notes (Signed)
Notified Dr. Charlies Silvers to ask her if the patients diet could be increased to a Dys 1 diet.  Th patient is fully alert this am and she does tolerated her pills crushed in full liquids.  New orders given and followed.

## 2014-03-23 NOTE — Progress Notes (Signed)
PT Cancellation Note  Patient Details Name: Martha Pearson MRN: [id number] DOB: 03/06/1918   Cancelled Treatment:    Reason Eval/Treat Not Completed: Other (comment)  Received orders and chart reviewed.  Per chart, pt is a LTC resident and has been requiring total assist with mobility skills.  Also, pt currently pending hospice consult.  Rehab at this time not appropriate; pt to be discharged from acute PT service.  If hospice services declined, PT will need a new order for consultation to assess if rehab is appropriate for this patient.     Mathhew Buysse 03/23/2014, 4:31 PM

## 2014-03-23 NOTE — Progress Notes (Signed)
INITIAL NUTRITION ASSESSMENT  DOCUMENTATION CODES Per approved criteria  -Underweight   INTERVENTION: Continue with current nutrition plan of care  NUTRITION DIAGNOSIS: Inadequate oral intake related to AMS as evidenced by NPO x 4 days.   Goal: Pt will meet nutrition needs as able  Monitor:  PO intake, labs, weight changes, I/O's  Reason for Assessment: Low braden  80 y.o. female  Admitting Dx: Encephalopathy, metabolic  Martha Pearson is a 80 y.o. female presenting with unresponsive episode. PMH is significant for Dementia, Anemia, CHF, HTN, CKD Stage IV, breast Ca.   ASSESSMENT: Pt is a resident of [facility] who was admitted for AMS.  Per RN, prior to this AM pt was no alert and was unable to take any foods and medications. Now she is alert and on a dysphagia 1 diet. Noted 100% of breakfast this AM.  Per wt hx, wt has been stable over the past year.  Per CSW, hospice to follow pt at SNF at discharge.  Height: Ht Readings from Last 1 Encounters:  03/20/14 5\' 6"  (1.676 m)    Weight: Wt Readings from Last 1 Encounters:  03/20/14 110 lb 0.2 oz (49.9 kg)    Ideal Body Weight: 130#  % Ideal Body Weight: 85%  Wt Readings from Last 10 Encounters:  03/20/14 110 lb 0.2 oz (49.9 kg)  03/15/14 105 lb (47.628 kg)  02/24/14 107 lb 2.3 oz (48.6 kg)  02/24/14 107 lb 2.3 oz (48.6 kg)  02/24/14 107 lb 2.3 oz (48.6 kg)  01/08/14 110 lb (49.896 kg)  11/11/13 110 lb (49.896 kg)  09/30/13 110 lb (49.896 kg)  06/20/13 113 lb (51.256 kg)  06/14/13 113 lb 7 oz (51.455 kg)   Usual Body Weight: 110#  % Usual Body Weight: 100%  BMI:  Body mass index is 17.76 kg/(m^2). Underweight  Estimated Nutritional Needs: Kcal: 1200-1400 Protein: 55-65 grams Fluid: 1.2-1.4 L  Skin: ecchymosis on lateral legs, chest, and arms  Diet Order: Dysphagia 1  EDUCATION NEEDS: -Education not appropriate at this time   Intake/Output Summary (Last 24 hours) at 03/23/14  0903 Last data filed at 03/23/14 0634  Gross per 24 hour  Intake      0 ml  Output   1850 ml  Net  -1850 ml    Last BM: PTA  Labs:   Recent Labs Lab 03/21/14 0604 03/22/14 0556 03/23/14 0530  NA 147 146 149*  K 4.1 4.2 4.0  CL 114* 113* 115*  CO2 22 21 20   BUN 60* 56* 57*  CREATININE 2.71* 2.70* 2.71*  CALCIUM 8.9 9.2 9.5  GLUCOSE 92 72 69*    CBG (last 3)  No results found for this basename: GLUCAP,  in the last 72 hours  Scheduled Meds: . amLODipine  10 mg Oral Daily  . antiseptic oral rinse  7 mL Mouth Rinse BID  . carvedilol  6.25 mg Oral BID WC  . Chlorhexidine Gluconate Cloth  6 each Topical Daily  . divalproex  125 mg Oral QHS  . feeding supplement (ENSURE COMPLETE)  237 mL Oral BID BM  . heparin  5,000 Units Subcutaneous 3 times per day  . levofloxacin  250 mg Oral Daily  . levothyroxine  100 mcg Oral QAC breakfast  . mupirocin ointment  1 application Nasal BID  . polyethylene glycol  17 g Oral Daily  . sertraline  25 mg Oral Daily    Continuous Infusions:   Past Medical History  Diagnosis Date  .  Hypertension   . Thyroid disease     hypothyroid  . Cancer     breast  . Hypothyroid   . Alzheimer disease   . Osteoporosis   . Hip fracture   . LVH (left ventricular hypertrophy)   . Acute on chronic diastolic CHF (congestive heart failure) 07/09/2012.  Marland Kitchen CKD (chronic kidney disease) stage 4, GFR 15-29 ml/min 09/23/2011  . Dementia     Past Surgical History  Procedure Laterality Date  . Mastectomy      right  . Colonoscopy  Dec 2011    Dr. Gala Romney: normal rectum, pancolonic diverticula, fixed clot in sigmoid s/p hemostasis and clipping , tubular adenoma  . Agile capsule N/A 02/27/2014    Procedure: AGILE CAPSULE;  Surgeon: Danie Binder, MD;  Location: [facility];  Service: Endoscopy;  Laterality: N/A;  . Esophagogastroduodenoscopy N/A 02/26/2014    Procedure: ESOPHAGOGASTRODUODENOSCOPY (EGD);  Surgeon: Danie Binder, MD;  Location: [facility];  Service: Endoscopy;  Laterality: N/A;    Martha Pearson A. Jimmye Norman, RD, LDN Pager: [phone]

## 2014-03-24 DIAGNOSIS — I509 Heart failure, unspecified: Secondary | ICD-10-CM

## 2014-03-24 DIAGNOSIS — I5033 Acute on chronic diastolic (congestive) heart failure: Secondary | ICD-10-CM

## 2014-03-24 LAB — BASIC METABOLIC PANEL
Anion gap: 14 (ref 5–15)
BUN: 58 mg/dL — ABNORMAL HIGH (ref 6–23)
CHLORIDE: 111 meq/L (ref 96–112)
CO2: 20 meq/L (ref 19–32)
Calcium: 9.3 mg/dL (ref 8.4–10.5)
Creatinine, Ser: 2.75 mg/dL — ABNORMAL HIGH (ref 0.50–1.10)
GFR calc Af Amer: 16 mL/min — ABNORMAL LOW (ref >90)
GFR calc non Af Amer: 14 mL/min — ABNORMAL LOW (ref >90)
GLUCOSE: 93 mg/dL (ref 70–99)
POTASSIUM: 4.1 meq/L (ref 3.7–5.3)
SODIUM: 145 meq/L (ref 137–147)

## 2014-03-24 MED ORDER — LORAZEPAM 0.5 MG PO TABS: 0.2500 mg | ORAL_TABLET | ORAL | Status: AC | PRN

## 2014-03-24 MED ORDER — LEVOFLOXACIN 250 MG PO TABS: 250.0000 mg | ORAL_TABLET | Freq: Every day | ORAL | Status: AC

## 2014-03-24 NOTE — Progress Notes (Signed)
Patient discharged to [facility] via EMS.  IV was removed and was clean, dry, and intact at removal.

## 2014-03-24 NOTE — Clinical Social Work Note (Signed)
Patient ready for discharge today, will transfer to [facility] via [facility].  Legal guardian, Marchia Meiers at bedside, notified and agreeable.  EMS will arrive when available to transport.  Edwyna Shell, LCSW Clinical Social Worker [phone])

## 2014-03-24 NOTE — Progress Notes (Signed)
Pt. Has order to leave foley in for end of life care.  Patient will be discharged with foley catheter.  Will continue to monitor.

## 2014-03-24 NOTE — Clinical Social Work Note (Signed)
[facility] came and assessed pt and do not feel that pt can return due to change in status. Discussed with MD who feels pt is Hospice Home appropriate. Facility has bed available today and can accept pt and Martha Pearson, pt's guardian is agreeable to this. Information faxed to [place]. Martha Pearson will complete paperwork this evening there. DNR sent with pt. [facility] to transport.  Benay Pike, [place]

## 2014-03-24 NOTE — Clinical Social Work Note (Signed)
Pt d/c today back to [facility]. Notified pt's niece, Vaughan Basta by [employer] on home and cell phone. Called pt's work and she is not there. Verified plan yesterday to return to [facility] with her with hospice. Whitney at facility reports they can come pick up pt. Will fax d/c summary and FL2. Aware of hospice consult upon return.   Benay Pike, [place]

## 2014-03-24 NOTE — Discharge Summary (Addendum)
Physician Discharge Summary  Martha Pearson NOB:[id number] DOB: 1918/05/26 DOA: 03/19/2014  PCP: Delphina Cahill, MD  Admit date: 03/19/2014 Discharge date: 03/24/2014  Time spent: >35 minutes  Recommendations for Outpatient Follow-up:  Hospice care   Discharge Diagnoses:  Principal Problem:   Encephalopathy, metabolic Active Problems:   Dementia   CKD (chronic kidney disease) stage 4, GFR 15-29 ml/min   Hypertension   Encephalopathy   Anemia   Hypernatremia   Discharge Condition: poor   Diet recommendation: comfort   Filed Weights   03/19/14 1832 03/20/14 0032 03/24/14 0434  Weight: 47.628 kg (105 lb) 49.9 kg (110 lb 0.2 oz) 51.5 kg (113 lb 8.6 oz)    History of present illness:  80 y.o. female with multiple medical comorbidities including but not limited to dementia, CHF, hypertension, chronic kidney disease, breast cancer who presented to [facility] 03/19/2014 with altered mental status which was thought to be from urinary tract infection or pyelonephritis versus hypernatremia. Patient has been followed by hospice for past 1 year. On admission, patient was found to have sodium of 149, creatinine of 2.5, hemoglobin was 8.1, WBC count 2.3 and platelet count of 122. During this hospital stay, patient had MRI of the brain which did not show acute intracranial abnormalities. There was a mention of remote lacunar infarcts in left basal ganglia and right cerebellar area. Chest x-ray did not show acute cardiopulmonary findings. Urinalysis showed many bacteria. In addition, patient was slightly hypothermic on admission. Patient was started on Levaquin on admission.   Hospital Course:  Acute metabolic encephalopathy / dementia  Possibly related to hypernatremia with sepsis secondary to urinary tract infection  No significant changes in mental status. Please note that the pt has history of dementia so her altered mental status could be because of worsening dementia.  Continue Zoloft and  Depakote Principal problem:  Sepsis secondary to urinary tract infection  Likely secondary to urinary tract infection  Patient met criteria for sepsis on admission with hypothermia, leukopenia and evidence of infection based on urinalysis.  Patient was started on Levaquin on admission. Please note that a urine culture and blood cultures were not obtained at the time of the admission to guide the antibiotic regimen; to complete OP regimen  Pancytopenia  Possibly due to sepsis. White blood cell count normalized. Hemoglobin on 03/21/2014 is 7.4. Platelets are 113.  Stable  Hypernatremia  Likely secondary to dehydration. Patient received IV fluids on admission. Sodium ranges 147-149. Anemia of chronic disease  Likely anemia of renal disease. Hemoglobin is 7.4. Obtain CBC today..  Essential Hypertension:  Continue Norvasc 10 mg daily, Corag 6.25 mg by mouth twice a day CKD (chronic kidney disease) stage 4, GFR 15-29 ml/min  Creatinine is stable at 2.7 Chronic diastolic heart failure  Compensated. Last 2-D echo in 2013 showed ejection fraction of 60%.  Continue Corag 6.25 mg by mouth twice a day Moderate protein calorie malnutrition  In the context of chronic illness, dementia. Continue nutritional supplements.   Prognosis is very poor with advanced dementia, progressive renal failure, CHF, at risk for recurrent infections, sepsis;  -Pt is DNR, hospice referral made; SW d/w guardian, agreed  -cont hospice care, prognosis is poor if recurrent infections likely < 6 week      Procedures:  none (i.e. Studies not automatically included, echos, thoracentesis, etc; not x-rays)  Consultations:  noen  Discharge Exam: Filed Vitals:   03/24/14 0434  BP: 166/75  Pulse: 71  Temp: 98 F (36.7 C)  Resp:  20    General: alert, confused Cardiovascular: s1,s2 rrr Respiratory: CTA BL  Discharge Instructions  Discharge Instructions   Diet - low sodium heart healthy    Complete by:  As  directed      Discharge instructions    Complete by:  As directed   Follow up with hospice care     Increase activity slowly    Complete by:  As directed             Medication List         acetaminophen 325 MG tablet  Commonly known as:  TYLENOL  Take 2 tablets (650 mg total) by mouth every 6 (six) hours as needed for fever.     amLODipine 10 MG tablet  Commonly known as:  NORVASC  Take 10 mg by mouth daily.     carvedilol 6.25 MG tablet  Commonly known as:  COREG  Take 1 tablet (6.25 mg total) by mouth 2 (two) times daily with a meal.     divalproex 125 MG DR tablet  Commonly known as:  DEPAKOTE  Take 125 mg by mouth at bedtime.     furosemide 20 MG tablet  Commonly known as:  LASIX  Take 20 mg by mouth daily. Weight gain of 3 pounds in 24 hours     FUSION PLUS Caps  1 PO DAILY     guaiFENesin-dextromethorphan 100-10 MG/5ML syrup  Commonly known as:  ROBITUSSIN DM  Take 5 mLs by mouth 3 (three) times daily as needed for cough.     levofloxacin 250 MG tablet  Commonly known as:  LEVAQUIN  Take 1 tablet (250 mg total) by mouth daily.     levothyroxine 100 MCG tablet  Commonly known as:  SYNTHROID, LEVOTHROID  Take 100 mcg by mouth daily before breakfast.     LORazepam 0.5 MG tablet  Commonly known as:  ATIVAN  Take 0.5 tablets (0.25 mg total) by mouth every 4 (four) hours as needed for anxiety.     pantoprazole 20 MG tablet  Commonly known as:  PROTONIX  Take 20 mg by mouth daily.     polyethylene glycol powder powder  Commonly known as:  GLYCOLAX/MIRALAX  Take 17 g by mouth daily.     promethazine 25 MG tablet  Commonly known as:  PHENERGAN  Take 25 mg by mouth every 6 (six) hours as needed for nausea.     QC ALL DAY ALLERGY 10 MG tablet  Generic drug:  cetirizine  Take 5 mg by mouth every 6 (six) hours as needed (for itching).     sertraline 25 MG tablet  Commonly known as:  ZOLOFT  Take 25 mg by mouth daily.     Vitamin D (Ergocalciferol)  50000 UNITS Caps capsule  Commonly known as:  DRISDOL  Take 50,000 Units by mouth every Saturday.     zinc oxide 20 % ointment  Apply 1 application topically 3 (three) times daily as needed. *One application applied to affected area(s) of buttocks three times daily as needed for redness       Allergies  Allergen Reactions  . Ace Inhibitors Other (See Comments)    unknown  . Penicillins Other (See Comments)    unknown  . Sulfa Antibiotics Other (See Comments)    unknown       Follow-up Information   Follow up with Delphina Cahill, MD.   Specialty:  Internal Medicine   Contact information:    [address]  [place] [address] [phone]        The results of significant diagnostics from this hospitalization (including imaging, microbiology, ancillary and laboratory) are listed below for reference.    Significant Diagnostic Studies: Dg Shoulder Right  03/15/2014   CLINICAL DATA:  Right shoulder pain.  EXAM: RIGHT SHOULDER - 2+ VIEW  COMPARISON:  None.  FINDINGS: Comminuted fractures of the distal right clavicle with inferior displacement and overriding of the distal fracture fragments. Visualization of the right shoulder is limited due to non standard views. No definite humeral fracture although glenohumeral dislocation cannot be excluded due to poor visualization.  IMPRESSION: Displaced fracture of the distal right clavicle. Limited visualization of the shoulder joint.   Electronically Signed   By: Lucienne Capers M.D.   On: 03/15/2014 22:25   Dg Abd 1 View  03/20/2014   CLINICAL DATA:  Screening for MRI, had an Agile capsule on 02/27/2014  EXAM: ABDOMEN - 1 VIEW  COMPARISON:  01/12/2014  FINDINGS: Post ORIF of the proximal femoral bilaterally.  No other radiopaque foreign bodies identified.  Extensive costal cartilaginous calcification.  Increased stool throughout colon.  Nonobstructive bowel gas pattern.  Questionable small RIGHT renal calculi.  Extensive costal cartilaginous  calcification.  Osseous demineralization with degenerative changes lumbar spine and posttraumatic deformity of LEFT pelvis.  IMPRESSION: No definite radiopaque foreign bodies identified.   Electronically Signed   By: Lavonia Dana M.D.   On: 03/20/2014 14:38   Ct Head Wo Contrast  03/19/2014   CLINICAL DATA:  Facial droop.  Code stroke.  Abnormal behavior.  EXAM: CT HEAD WITHOUT CONTRAST  TECHNIQUE: Contiguous axial images were obtained from the base of the skull through the vertex without intravenous contrast.  COMPARISON:  03/15/2014  FINDINGS: Small remote infarcts in the right inferior cerebellum. Small remote lacunar infarct in the right lower pons along the cerebellar peduncle, image 8 series 2.  Remote lacunar infarct in the left lentiform nucleus.  Ex vacuo ventriculomegaly. Remote lacunar infarct in the head of the left caudate nucleus. Periventricular white matter and corona radiata hypodensities favor chronic ischemic microvascular white matter disease.  No intracranial hemorrhage, mass lesion, or acute CVA. There is complete opacification of the visualized portion of the right maxillary sinus with medial bulging of the medial wall of the maxillary sinus and internal calcifications. Bony thickening in the maxillary sinuses present. This appearance is similar to prior. There is also opacification of multiple right-sided ethmoid air cells and could complete opacification of the right frontal sinus. Small right mastoid effusion.  Upper cervical fixation hardware is partially included.  IMPRESSION: 1. No acute intracranial findings. Scattered remote infarcts. Periventricular white matter and corona radiata hypodensities favor chronic ischemic microvascular white matter disease. 2. Complete opacification of the visualize right frontal sinuses and right maxillary sinus, with chronic medial wall obliteration of the right maxillary sinus. Acute sinusitis of the right frontal sinus is not readily excluded.  Adjacent chronic ethmoid sinusitis is also present. 3. Small right mastoid effusion. These results were called by telephone at the time of interpretation on 03/19/2014 at 6:54 pm to Dr. Milton Ferguson , who verbally acknowledged these results.   Electronically Signed   By: Sherryl Barters M.D.   On: 03/19/2014 18:54   Ct Head Wo Contrast  03/15/2014   CLINICAL DATA:  Patient fell from toilet onto floor hitting head with right side lacerations stable  EXAM: CT HEAD WITHOUT CONTRAST  CT CERVICAL SPINE WITHOUT CONTRAST  TECHNIQUE: Multidetector  CT imaging of the head and cervical spine was performed following the standard protocol without intravenous contrast. Multiplanar CT image reconstructions of the cervical spine were also generated.  COMPARISON:  10/27/2013  FINDINGS: CT HEAD FINDINGS  Severe atrophy with stable proportional dilatation of the ventricles. Chronic lacunar infarcts left basal ganglia, stable. Study limited by motion artifact, but no evidence of acute hemorrhage or extra-axial fluid. No evidence of acute infarct. Complete opacification of the visualized portion of the maxillary sinus, stable from prior study, as is wall thickening and sclerosis of the maxillary sinus and involvement of ethmoid air cells on the right. Right scalp laceration noted.  CT CERVICAL SPINE FINDINGS  There is normal alignment with no prevertebral soft tissue swelling. There is no fracture. There is moderate spondylosis throughout the cervical spine. There are areas postsurgical change at C1 and C2 with evidence of posterior fusion. There are numerous pulmonary nodules seen visualized portion of the lung apices bilaterally, all measuring less than or equal to 5 mm. These are not seen on the prior study, although less volume of the lung apices was included at that time.  IMPRESSION: No acute intracranial abnormality. No acute abnormality involving cervical spine.  Multiple lung nodules seen in the apices. Given history of  breast cancer, this is concerning for metastasis. Formal evaluation of the thorax with CT scan suggested.   Electronically Signed   By: Skipper Cliche M.D.   On: 03/15/2014 22:16   Ct Cervical Spine Wo Contrast  03/15/2014   CLINICAL DATA:  Patient fell from toilet onto floor hitting head with right side lacerations stable  EXAM: CT HEAD WITHOUT CONTRAST  CT CERVICAL SPINE WITHOUT CONTRAST  TECHNIQUE: Multidetector CT imaging of the head and cervical spine was performed following the standard protocol without intravenous contrast. Multiplanar CT image reconstructions of the cervical spine were also generated.  COMPARISON:  10/27/2013  FINDINGS: CT HEAD FINDINGS  Severe atrophy with stable proportional dilatation of the ventricles. Chronic lacunar infarcts left basal ganglia, stable. Study limited by motion artifact, but no evidence of acute hemorrhage or extra-axial fluid. No evidence of acute infarct. Complete opacification of the visualized portion of the maxillary sinus, stable from prior study, as is wall thickening and sclerosis of the maxillary sinus and involvement of ethmoid air cells on the right. Right scalp laceration noted.  CT CERVICAL SPINE FINDINGS  There is normal alignment with no prevertebral soft tissue swelling. There is no fracture. There is moderate spondylosis throughout the cervical spine. There are areas postsurgical change at C1 and C2 with evidence of posterior fusion. There are numerous pulmonary nodules seen visualized portion of the lung apices bilaterally, all measuring less than or equal to 5 mm. These are not seen on the prior study, although less volume of the lung apices was included at that time.  IMPRESSION: No acute intracranial abnormality. No acute abnormality involving cervical spine.  Multiple lung nodules seen in the apices. Given history of breast cancer, this is concerning for metastasis. Formal evaluation of the thorax with CT scan suggested.   Electronically Signed    By: Skipper Cliche M.D.   On: 03/15/2014 22:16   Mr Brain Wo Contrast  03/20/2014   CLINICAL DATA:  Confusion.  Facial droop.  EXAM: MRI HEAD WITHOUT CONTRAST  TECHNIQUE: Multiplanar, multiecho pulse sequences of the brain and surrounding structures were obtained without intravenous contrast.  COMPARISON:  CT head without contrast 03/19/2014  FINDINGS: The diffusion-weighted images demonstrate no evidence for acute  or subacute infarction. Moderate generalized atrophy and diffuse white matter disease is evident bilaterally. Remote lacunar infarcts are present in the left basal ganglia. Asymmetric atrophy is noted in the left cortical spinal tract and cerebral peduncle. Remote lacunar infarcts are noted in the right cerebellum.  Flow is present in the major intracranial arteries. The globes and orbits are intact.  The right maxillary sinus is opacified. Stents mucosal thickening is present in the ethmoid air cells bilaterally. The frontal sinus is opacified on the right. Mild mucosal thickening is present in the sphenoid sinuses bilaterally, right worse than left. Right greater left mastoid effusions are present.  IMPRESSION: 1. No acute intracranial abnormality. 2. Remote lacunar infarcts of the left basal ganglia and right cerebellum. 3. Moderate diffuse sinus disease, most prominent in the right maxillary sinus, right ethmoid air cells, and right frontal sinus. 4. Right greater left mastoid effusions. No obstructing nasopharyngeal lesions are evident.   Electronically Signed   By: Lawrence Santiago M.D.   On: 03/20/2014 20:05   Dg Chest Portable 1 View  03/19/2014   CLINICAL DATA:  Altered mental status ; hypertension with history of breast carcinoma  EXAM: PORTABLE CHEST - 1 VIEW  COMPARISON:  February 23, 2014 and September 26, 2012  FINDINGS: There is generalized interstitial edema which appears superimposed on a degree of interstitial fibrosis. There are small pleural effusions bilaterally. There is no airspace  consolidation. Heart is enlarged with pulmonary vascularity demonstrating pulmonary venous hypertension. No adenopathy. There is atherosclerotic change in the aorta.  IMPRESSION: Evidence of congestive heart failure superimposed on pulmonary fibrosis. No airspace consolidation.   Electronically Signed   By: Lowella Grip M.D.   On: 03/19/2014 20:42   Dg Chest Portable 1 View  02/23/2014   CLINICAL DATA:  Weakness, with history of hypertension.  EXAM: PORTABLE CHEST - 1 VIEW  COMPARISON:  01/12/2014.  FINDINGS: Cardiomegaly. Calcified tortuous aorta. Bilateral airspace opacities with effusions, probable pulmonary edema. Baseline chronic lung disease with interstitial prominence. Chronic deformity left humeral neck. Osteopenia. Worsening aeration from priors.  IMPRESSION: Cardiomegaly with bilateral airspace opacities and effusions, likely CHF.   Electronically Signed   By: Rolla Flatten M.D.   On: 02/23/2014 19:18    Microbiology: Recent Results (from the past 240 hour(s))  MRSA PCR SCREENING     Status: Abnormal   Collection Time    03/20/14  1:07 AM      Result Value Ref Range Status   MRSA by PCR POSITIVE (*) NEGATIVE Final   Comment:            The GeneXpert MRSA Assay (FDA     approved for NASAL specimens     only), is one component of a     comprehensive MRSA colonization     surveillance program. It is not     intended to diagnose MRSA     infection nor to guide or     monitor treatment for     MRSA infections.     RESULT CALLED TO, READ BACK BY AND VERIFIED WITH:     Earlie Server AT 0315 ON [id number] BY FORSYTH K     Labs: Basic Metabolic Panel:  Recent Labs Lab 03/20/14 0631 03/21/14 0604 03/22/14 0556 03/23/14 0530 03/24/14 0547  NA 150* 147 146 149* 145  K 5.1 4.1 4.2 4.0 4.1  CL 116* 114* 113* 115* 111  CO2 23 22 21 20 20   GLUCOSE 86 92 72 69* 93  BUN  69* 60* 56* 57* 58*  CREATININE 2.49* 2.71* 2.70* 2.71* 2.75*  CALCIUM 8.4 8.9 9.2 9.5 9.3   Liver Function  Tests:  Recent Labs Lab 03/19/14 1906 03/20/14 0631  AST 35 34  ALT 38* 38*  ALKPHOS 92 89  BILITOT <0.2* <0.2*  PROT 6.0 5.7*  ALBUMIN 2.6* 2.4*   No results found for this basename: LIPASE, AMYLASE,  in the last 168 hours No results found for this basename: AMMONIA,  in the last 168 hours CBC:  Recent Labs Lab 03/19/14 1906 03/20/14 0631 03/21/14 0604 03/23/14 1405  WBC 2.3* 3.6* 4.6 4.0  NEUTROABS 1.6*  --   --   --   HGB 8.1* 7.5* 7.4* 7.9*  HCT 27.6* 25.5* 25.9* 26.3*  MCV 72.1* 71.8* 71.9* 71.5*  PLT 122* 144* 113* 108*   Cardiac Enzymes:  Recent Labs Lab 03/19/14 1906  TROPONINI <0.30   BNP: BNP (last 3 results)  Recent Labs  02/23/14 1935 03/19/14 1906  PROBNP 6266.0* 722.6*   CBG:  Recent Labs Lab 03/19/14 1906  GLUCAP 118*       Signed:  Rowe Clack N  [facility] 03/24/2014, 9:11 AM

## 2014-03-24 NOTE — Progress Notes (Signed)
Report given to Maudie Mercury at [facility].

## 2014-03-24 NOTE — Progress Notes (Signed)
Pt. Was unable to swallow this AM when trying to give medications.  Notified MD and hold medications.  Will continue to monitor patient.

## 2014-03-25 NOTE — Progress Notes (Signed)
UR chart review completed.  

## 2014-04-21 DEATH — deceased

## 2014-05-11 ENCOUNTER — Ambulatory Visit: Payer: Medicare Other | Admitting: Gastroenterology

## 2016-01-02 IMAGING — CR DG CHEST 1V PORT
1 series · 1 of 1 positions shown · non-contrast
Comparison: 01/12/2014.

CLINICAL DATA: Weakness, with history of hypertension.

EXAM:
PORTABLE CHEST - 1 VIEW

[pa]
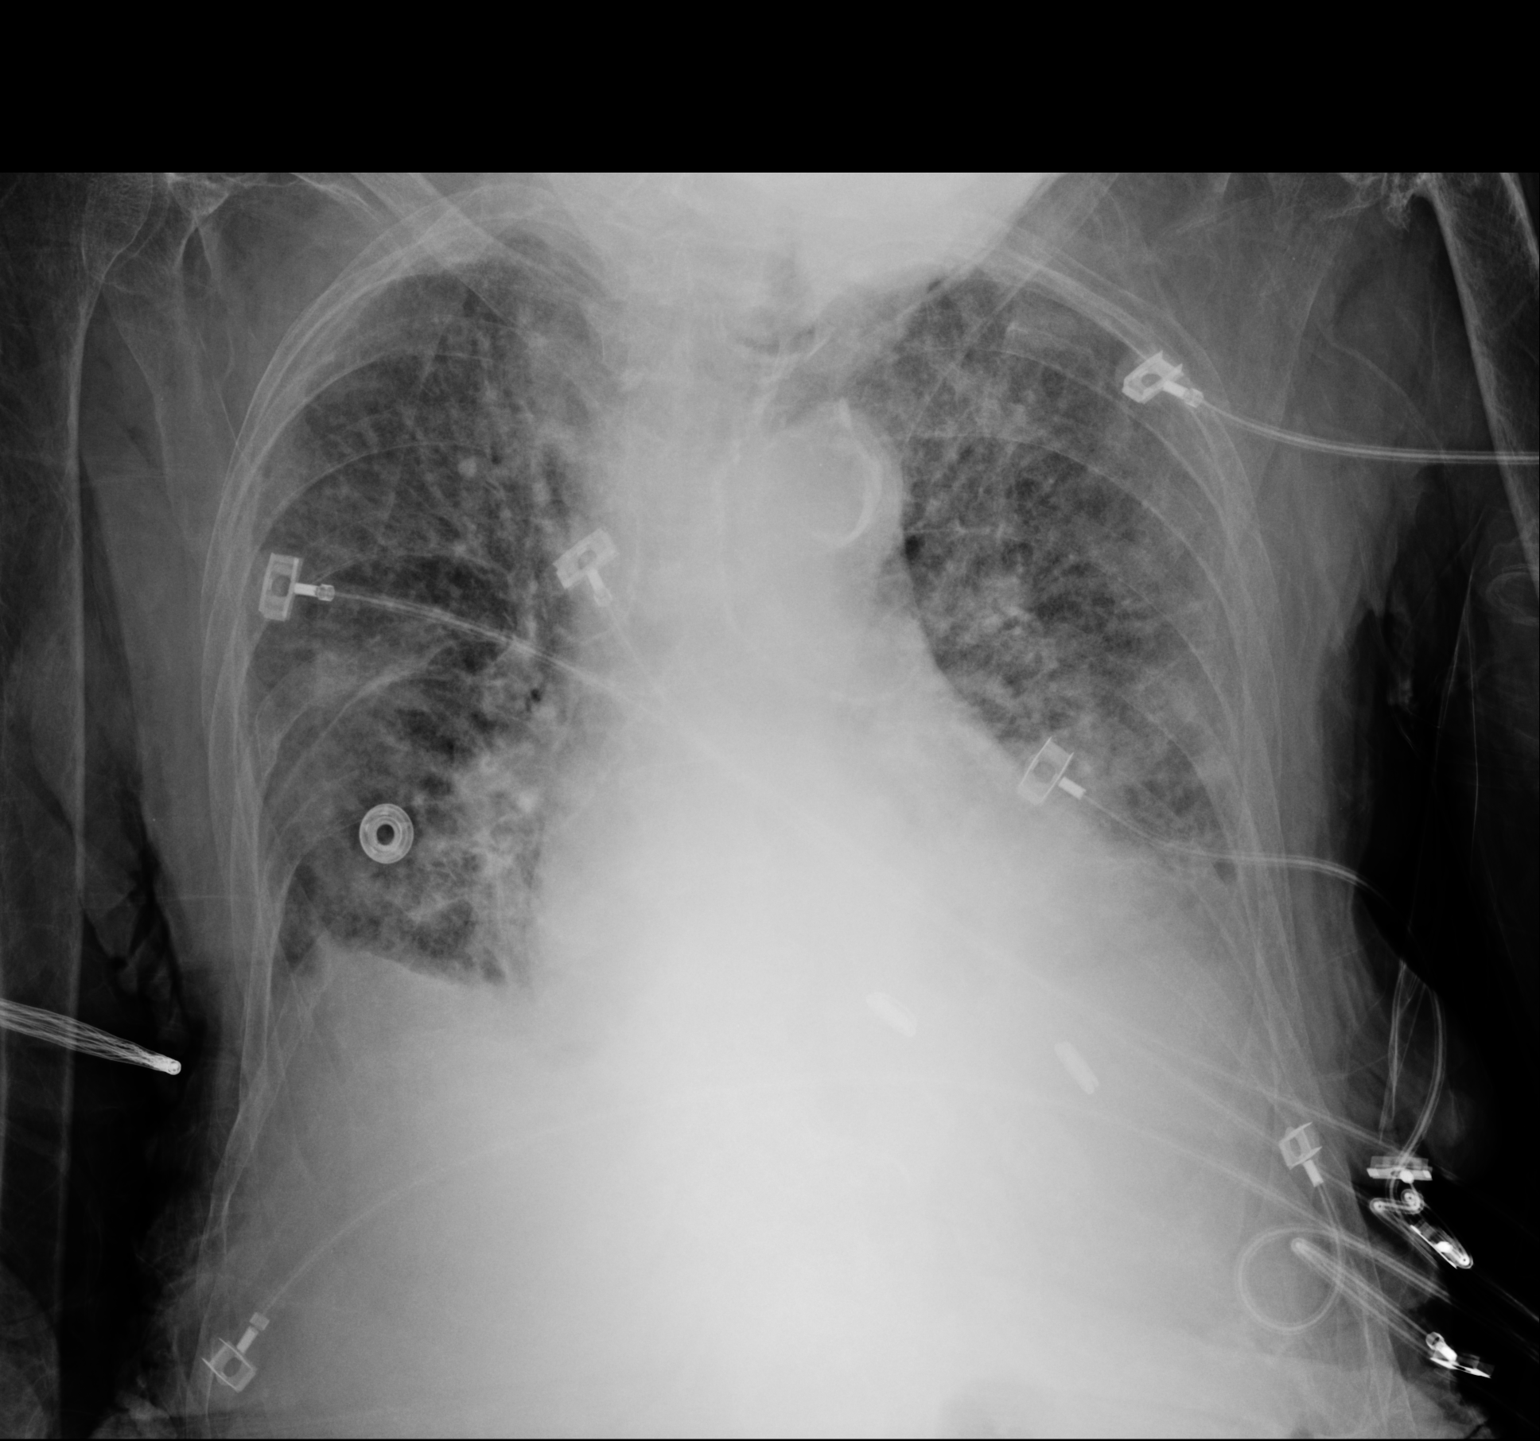

[1 of 1 positions shown; findings below may reference images not displayed]

FINDINGS: Cardiomegaly. Calcified tortuous aorta. Bilateral airspace opacities
with effusions, probable pulmonary edema. Baseline chronic lung
disease with interstitial prominence. Chronic deformity left humeral
neck. Osteopenia. Worsening aeration from priors.
IMPRESSION: Cardiomegaly with bilateral airspace opacities and effusions, likely
CHF.
